# Patient Record
Sex: Female | Born: 1980 | Race: White | Hispanic: No | Marital: Single | Smoking: Current every day smoker
Health system: Southern US, Community
[De-identification: ages and names within clinical notes are randomized; demographics above are authoritative.]

## PROBLEM LIST (undated history)

## (undated) DIAGNOSIS — N390 Urinary tract infection, site not specified: Secondary | ICD-10-CM

## (undated) DIAGNOSIS — R519 Headache, unspecified: Secondary | ICD-10-CM

## (undated) DIAGNOSIS — R569 Unspecified convulsions: Secondary | ICD-10-CM

## (undated) DIAGNOSIS — F32A Depression, unspecified: Secondary | ICD-10-CM

## (undated) DIAGNOSIS — M4800 Spinal stenosis, site unspecified: Secondary | ICD-10-CM

## (undated) DIAGNOSIS — F329 Major depressive disorder, single episode, unspecified: Secondary | ICD-10-CM

## (undated) DIAGNOSIS — M6289 Other specified disorders of muscle: Secondary | ICD-10-CM

## (undated) DIAGNOSIS — Z87442 Personal history of urinary calculi: Secondary | ICD-10-CM

## (undated) DIAGNOSIS — F1910 Other psychoactive substance abuse, uncomplicated: Secondary | ICD-10-CM

## (undated) DIAGNOSIS — F419 Anxiety disorder, unspecified: Secondary | ICD-10-CM

## (undated) DIAGNOSIS — Z8739 Personal history of other diseases of the musculoskeletal system and connective tissue: Secondary | ICD-10-CM

## (undated) DIAGNOSIS — M1990 Unspecified osteoarthritis, unspecified site: Secondary | ICD-10-CM

## (undated) DIAGNOSIS — R011 Cardiac murmur, unspecified: Secondary | ICD-10-CM

## (undated) DIAGNOSIS — R51 Headache: Secondary | ICD-10-CM

## (undated) DIAGNOSIS — Z9289 Personal history of other medical treatment: Secondary | ICD-10-CM

## (undated) HISTORY — PX: LAPAROSCOPY: SHX197

## (undated) HISTORY — PX: TONSILLECTOMY: SUR1361

## (undated) HISTORY — PX: ADENOIDECTOMY: SUR15

## (undated) HISTORY — PX: COLONOSCOPY: SHX174

## (undated) HISTORY — PX: DILATION AND CURETTAGE OF UTERUS: SHX78

## (undated) HISTORY — PX: ABDOMINAL HYSTERECTOMY: SHX81

## (undated) HISTORY — PX: KNEE ARTHROSCOPY: SUR90

## (undated) HISTORY — PX: TYMPANOSTOMY TUBE PLACEMENT: SHX32

---

## 2017-11-18 ENCOUNTER — Emergency Department (HOSPITAL_COMMUNITY): Payer: Medicaid Other

## 2017-11-18 ENCOUNTER — Other Ambulatory Visit: Payer: Self-pay

## 2017-11-18 ENCOUNTER — Inpatient Hospital Stay (HOSPITAL_COMMUNITY)
Admission: EM | Admit: 2017-11-18 | Discharge: 2017-12-02 | DRG: 574 | Disposition: A | Discharge status: Home / Self Care | Payer: Medicaid Other | Attending: Family Medicine | Admitting: Family Medicine

## 2017-11-18 ENCOUNTER — Encounter (HOSPITAL_COMMUNITY): Payer: Self-pay | Admitting: *Deleted

## 2017-11-18 DIAGNOSIS — D649 Anemia, unspecified: Secondary | ICD-10-CM | POA: Diagnosis not present

## 2017-11-18 DIAGNOSIS — Z888 Allergy status to other drugs, medicaments and biological substances status: Secondary | ICD-10-CM | POA: Diagnosis not present

## 2017-11-18 DIAGNOSIS — Z881 Allergy status to other antibiotic agents status: Secondary | ICD-10-CM

## 2017-11-18 DIAGNOSIS — F39 Unspecified mood [affective] disorder: Secondary | ICD-10-CM | POA: Diagnosis not present

## 2017-11-18 DIAGNOSIS — L0211 Cutaneous abscess of neck: Secondary | ICD-10-CM | POA: Diagnosis not present

## 2017-11-18 DIAGNOSIS — F329 Major depressive disorder, single episode, unspecified: Secondary | ICD-10-CM | POA: Diagnosis not present

## 2017-11-18 DIAGNOSIS — L03115 Cellulitis of right lower limb: Principal | ICD-10-CM

## 2017-11-18 DIAGNOSIS — M009 Pyogenic arthritis, unspecified: Secondary | ICD-10-CM | POA: Diagnosis present

## 2017-11-18 DIAGNOSIS — L02415 Cutaneous abscess of right lower limb: Secondary | ICD-10-CM | POA: Diagnosis present

## 2017-11-18 DIAGNOSIS — F1490 Cocaine use, unspecified, uncomplicated: Secondary | ICD-10-CM | POA: Diagnosis present

## 2017-11-18 DIAGNOSIS — F1590 Other stimulant use, unspecified, uncomplicated: Secondary | ICD-10-CM | POA: Diagnosis present

## 2017-11-18 DIAGNOSIS — M65071 Abscess of tendon sheath, right ankle and foot: Secondary | ICD-10-CM | POA: Diagnosis not present

## 2017-11-18 DIAGNOSIS — L02414 Cutaneous abscess of left upper limb: Secondary | ICD-10-CM | POA: Diagnosis present

## 2017-11-18 DIAGNOSIS — Z7151 Drug abuse counseling and surveillance of drug abuser: Secondary | ICD-10-CM | POA: Diagnosis not present

## 2017-11-18 DIAGNOSIS — L03221 Cellulitis of neck: Secondary | ICD-10-CM | POA: Diagnosis present

## 2017-11-18 DIAGNOSIS — L03313 Cellulitis of chest wall: Secondary | ICD-10-CM | POA: Diagnosis not present

## 2017-11-18 DIAGNOSIS — K5900 Constipation, unspecified: Secondary | ICD-10-CM | POA: Diagnosis present

## 2017-11-18 DIAGNOSIS — M62838 Other muscle spasm: Secondary | ICD-10-CM | POA: Diagnosis present

## 2017-11-18 DIAGNOSIS — L02611 Cutaneous abscess of right foot: Secondary | ICD-10-CM

## 2017-11-18 DIAGNOSIS — R739 Hyperglycemia, unspecified: Secondary | ICD-10-CM | POA: Diagnosis not present

## 2017-11-18 DIAGNOSIS — R011 Cardiac murmur, unspecified: Secondary | ICD-10-CM | POA: Diagnosis not present

## 2017-11-18 DIAGNOSIS — L03031 Cellulitis of right toe: Secondary | ICD-10-CM | POA: Diagnosis not present

## 2017-11-18 DIAGNOSIS — B9561 Methicillin susceptible Staphylococcus aureus infection as the cause of diseases classified elsewhere: Secondary | ICD-10-CM | POA: Diagnosis present

## 2017-11-18 DIAGNOSIS — F1290 Cannabis use, unspecified, uncomplicated: Secondary | ICD-10-CM | POA: Diagnosis present

## 2017-11-18 DIAGNOSIS — F419 Anxiety disorder, unspecified: Secondary | ICD-10-CM

## 2017-11-18 DIAGNOSIS — F1190 Opioid use, unspecified, uncomplicated: Secondary | ICD-10-CM | POA: Diagnosis present

## 2017-11-18 DIAGNOSIS — F32A Depression, unspecified: Secondary | ICD-10-CM | POA: Diagnosis present

## 2017-11-18 DIAGNOSIS — L0390 Cellulitis, unspecified: Secondary | ICD-10-CM | POA: Diagnosis not present

## 2017-11-18 DIAGNOSIS — F1410 Cocaine abuse, uncomplicated: Secondary | ICD-10-CM | POA: Diagnosis not present

## 2017-11-18 DIAGNOSIS — F1990 Other psychoactive substance use, unspecified, uncomplicated: Secondary | ICD-10-CM

## 2017-11-18 DIAGNOSIS — Z23 Encounter for immunization: Secondary | ICD-10-CM | POA: Diagnosis not present

## 2017-11-18 DIAGNOSIS — F17210 Nicotine dependence, cigarettes, uncomplicated: Secondary | ICD-10-CM | POA: Diagnosis present

## 2017-11-18 DIAGNOSIS — D72829 Elevated white blood cell count, unspecified: Secondary | ICD-10-CM | POA: Diagnosis not present

## 2017-11-18 DIAGNOSIS — R609 Edema, unspecified: Secondary | ICD-10-CM

## 2017-11-18 DIAGNOSIS — Z91041 Radiographic dye allergy status: Secondary | ICD-10-CM | POA: Diagnosis not present

## 2017-11-18 DIAGNOSIS — E876 Hypokalemia: Secondary | ICD-10-CM | POA: Diagnosis not present

## 2017-11-18 DIAGNOSIS — L03116 Cellulitis of left lower limb: Secondary | ICD-10-CM | POA: Diagnosis present

## 2017-11-18 DIAGNOSIS — D72828 Other elevated white blood cell count: Secondary | ICD-10-CM | POA: Diagnosis not present

## 2017-11-18 DIAGNOSIS — F1910 Other psychoactive substance abuse, uncomplicated: Secondary | ICD-10-CM | POA: Diagnosis not present

## 2017-11-18 DIAGNOSIS — Z79899 Other long term (current) drug therapy: Secondary | ICD-10-CM

## 2017-11-18 DIAGNOSIS — Z978 Presence of other specified devices: Secondary | ICD-10-CM | POA: Diagnosis not present

## 2017-11-18 DIAGNOSIS — B955 Unspecified streptococcus as the cause of diseases classified elsewhere: Secondary | ICD-10-CM | POA: Diagnosis not present

## 2017-11-18 DIAGNOSIS — L02213 Cutaneous abscess of chest wall: Secondary | ICD-10-CM | POA: Diagnosis present

## 2017-11-18 DIAGNOSIS — R7881 Bacteremia: Secondary | ICD-10-CM | POA: Diagnosis not present

## 2017-11-18 DIAGNOSIS — F418 Other specified anxiety disorders: Secondary | ICD-10-CM | POA: Diagnosis present

## 2017-11-18 HISTORY — DX: Personal history of other diseases of the musculoskeletal system and connective tissue: Z87.39

## 2017-11-18 HISTORY — DX: Other psychoactive substance abuse, uncomplicated: F19.10

## 2017-11-18 LAB — COMPREHENSIVE METABOLIC PANEL
ALBUMIN: 3 g/dL — AB (ref 3.5–5.0)
ALK PHOS: 84 U/L (ref 38–126)
ALT: 18 U/L (ref 14–54)
AST: 22 U/L (ref 15–41)
Anion gap: 15 (ref 5–15)
BUN: 20 mg/dL (ref 6–20)
CALCIUM: 8.6 mg/dL — AB (ref 8.9–10.3)
CO2: 23 mmol/L (ref 22–32)
CREATININE: 0.73 mg/dL (ref 0.44–1.00)
Chloride: 100 mmol/L — ABNORMAL LOW (ref 101–111)
GFR calc Af Amer: >60 mL/min (ref >60)
GFR calc non Af Amer: >60 mL/min (ref >60)
GLUCOSE: 131 mg/dL — AB (ref 65–99)
Potassium: 3.5 mmol/L (ref 3.5–5.1)
SODIUM: 138 mmol/L (ref 135–145)
Total Bilirubin: 0.6 mg/dL (ref 0.3–1.2)
Total Protein: 7.4 g/dL (ref 6.5–8.1)

## 2017-11-18 LAB — CBC WITH DIFFERENTIAL/PLATELET
Basophils Absolute: 0 10*3/uL (ref 0.0–0.1)
Basophils Relative: 0 %
EOS PCT: 0 %
Eosinophils Absolute: 0 10*3/uL (ref 0.0–0.7)
HEMATOCRIT: 44.2 % (ref 36.0–46.0)
HEMOGLOBIN: 14.7 g/dL (ref 12.0–15.0)
Lymphocytes Relative: 6 %
Lymphs Abs: 1.4 10*3/uL (ref 0.7–4.0)
MCH: 29 pg (ref 26.0–34.0)
MCHC: 33.3 g/dL (ref 30.0–36.0)
MCV: 87.2 fL (ref 78.0–100.0)
MONO ABS: 0.9 10*3/uL (ref 0.1–1.0)
MONOS PCT: 4 %
NEUTROS PCT: 90 %
Neutro Abs: 20.5 10*3/uL — ABNORMAL HIGH (ref 1.7–7.7)
Platelets: 214 10*3/uL (ref 150–400)
RBC: 5.07 MIL/uL (ref 3.87–5.11)
RDW: 13.6 % (ref 11.5–15.5)
WBC: 22.8 10*3/uL — ABNORMAL HIGH (ref 4.0–10.5)

## 2017-11-18 LAB — I-STAT BETA HCG BLOOD, ED (MC, WL, AP ONLY)

## 2017-11-18 LAB — CBC
HCT: 43.5 % (ref 36.0–46.0)
HEMOGLOBIN: 14.5 g/dL (ref 12.0–15.0)
MCH: 29.5 pg (ref 26.0–34.0)
MCHC: 33.3 g/dL (ref 30.0–36.0)
MCV: 88.4 fL (ref 78.0–100.0)
Platelets: 206 10*3/uL (ref 150–400)
RBC: 4.92 MIL/uL (ref 3.87–5.11)
RDW: 13.6 % (ref 11.5–15.5)
WBC: 22.5 10*3/uL — ABNORMAL HIGH (ref 4.0–10.5)

## 2017-11-18 LAB — LIPASE, BLOOD: Lipase: 20 U/L (ref 11–51)

## 2017-11-18 LAB — I-STAT CG4 LACTIC ACID, ED: Lactic Acid, Venous: 1.48 mmol/L (ref 0.5–1.9)

## 2017-11-18 MED ORDER — ONDANSETRON 4 MG PO TBDP
4.0000 mg | ORAL_TABLET | Freq: Once | ORAL | Status: AC | PRN
  Administered 2017-11-18: 4 mg via ORAL
  Filled 2017-11-18: qty 1

## 2017-11-18 MED ORDER — TRAZODONE HCL 50 MG PO TABS
150.0000 mg | ORAL_TABLET | Freq: Every day | ORAL | Status: DC
  Administered 2017-11-18 – 2017-12-01 (×14): 150 mg via ORAL
  Filled 2017-11-18 (×14): qty 1

## 2017-11-18 MED ORDER — ONDANSETRON HCL 4 MG/2ML IJ SOLN
4.0000 mg | Freq: Four times a day (QID) | INTRAMUSCULAR | Status: DC | PRN
  Administered 2017-11-18 – 2017-11-22 (×2): 4 mg via INTRAVENOUS
  Filled 2017-11-18: qty 2

## 2017-11-18 MED ORDER — NICOTINE 21 MG/24HR TD PT24
21.0000 mg | MEDICATED_PATCH | Freq: Every day | TRANSDERMAL | Status: DC
  Administered 2017-11-18 – 2017-12-02 (×13): 21 mg via TRANSDERMAL
  Filled 2017-11-18 (×12): qty 1

## 2017-11-18 MED ORDER — ORAL CARE MOUTH RINSE
15.0000 mL | Freq: Two times a day (BID) | OROMUCOSAL | Status: DC
  Administered 2017-11-18 – 2017-12-01 (×11): 15 mL via OROMUCOSAL

## 2017-11-18 MED ORDER — ALBUTEROL SULFATE (2.5 MG/3ML) 0.083% IN NEBU: 2.5000 mg | INHALATION_SOLUTION | Freq: Four times a day (QID) | RESPIRATORY_TRACT | Status: DC | PRN

## 2017-11-18 MED ORDER — OXYCODONE-ACETAMINOPHEN 5-325 MG PO TABS
1.0000 | ORAL_TABLET | ORAL | Status: DC | PRN
  Administered 2017-11-18 – 2017-11-20 (×6): 1 via ORAL
  Filled 2017-11-18 (×6): qty 1

## 2017-11-18 MED ORDER — SODIUM CHLORIDE 0.9 % IV BOLUS
1000.0000 mL | Freq: Once | INTRAVENOUS | Status: AC
  Administered 2017-11-18: 1000 mL via INTRAVENOUS

## 2017-11-18 MED ORDER — ACETAMINOPHEN 325 MG PO TABS
650.0000 mg | ORAL_TABLET | Freq: Four times a day (QID) | ORAL | Status: DC | PRN
  Administered 2017-11-20 – 2017-11-22 (×3): 650 mg via ORAL
  Filled 2017-11-18 (×3): qty 2

## 2017-11-18 MED ORDER — SODIUM CHLORIDE 0.9 % IV SOLN
INTRAVENOUS | Status: DC
  Administered 2017-11-18 – 2017-11-22 (×5): via INTRAVENOUS

## 2017-11-18 MED ORDER — TETANUS-DIPHTH-ACELL PERTUSSIS 5-2.5-18.5 LF-MCG/0.5 IM SUSP
0.5000 mL | Freq: Once | INTRAMUSCULAR | Status: AC
  Administered 2017-11-18: 0.5 mL via INTRAMUSCULAR
  Filled 2017-11-18: qty 0.5

## 2017-11-18 MED ORDER — VANCOMYCIN HCL IN DEXTROSE 1-5 GM/200ML-% IV SOLN
1000.0000 mg | Freq: Once | INTRAVENOUS | Status: AC
  Administered 2017-11-18: 1000 mg via INTRAVENOUS
  Filled 2017-11-18: qty 200

## 2017-11-18 MED ORDER — QUETIAPINE FUMARATE 25 MG PO TABS
25.0000 mg | ORAL_TABLET | Freq: Every day | ORAL | Status: DC
  Administered 2017-11-18 – 2017-11-24 (×7): 50 mg via ORAL
  Administered 2017-11-25 – 2017-11-27 (×3): 25 mg via ORAL
  Administered 2017-11-28 – 2017-11-29 (×2): 50 mg via ORAL
  Administered 2017-11-30: 25 mg via ORAL
  Administered 2017-12-01: 50 mg via ORAL
  Filled 2017-11-18 (×4): qty 2
  Filled 2017-11-18: qty 1
  Filled 2017-11-18 (×10): qty 2

## 2017-11-18 MED ORDER — FLUOXETINE HCL 20 MG PO CAPS
20.0000 mg | ORAL_CAPSULE | Freq: Every day | ORAL | Status: DC
  Administered 2017-11-19 – 2017-12-02 (×11): 20 mg via ORAL
  Filled 2017-11-18 (×11): qty 1

## 2017-11-18 MED ORDER — IBUPROFEN 800 MG PO TABS
800.0000 mg | ORAL_TABLET | Freq: Four times a day (QID) | ORAL | Status: DC | PRN
  Administered 2017-11-19: 800 mg via ORAL
  Filled 2017-11-18: qty 1

## 2017-11-18 MED ORDER — ONDANSETRON HCL 4 MG PO TABS: 4.0000 mg | ORAL_TABLET | Freq: Four times a day (QID) | ORAL | Status: DC | PRN

## 2017-11-18 MED ORDER — ALPRAZOLAM 0.5 MG PO TABS
1.0000 mg | ORAL_TABLET | Freq: Two times a day (BID) | ORAL | Status: DC | PRN
  Administered 2017-11-19 – 2017-12-02 (×11): 1 mg via ORAL
  Filled 2017-11-18 (×13): qty 2

## 2017-11-18 MED ORDER — VANCOMYCIN HCL IN DEXTROSE 750-5 MG/150ML-% IV SOLN
750.0000 mg | Freq: Two times a day (BID) | INTRAVENOUS | Status: DC
  Administered 2017-11-19 – 2017-11-20 (×3): 750 mg via INTRAVENOUS
  Filled 2017-11-18 (×3): qty 150

## 2017-11-18 MED ORDER — ACETAMINOPHEN 650 MG RE SUPP: 650.0000 mg | Freq: Four times a day (QID) | RECTAL | Status: DC | PRN

## 2017-11-18 MED ORDER — GABAPENTIN 300 MG PO CAPS
600.0000 mg | ORAL_CAPSULE | Freq: Four times a day (QID) | ORAL | Status: DC
  Administered 2017-11-18 – 2017-12-02 (×48): 600 mg via ORAL
  Filled 2017-11-18 (×51): qty 2

## 2017-11-18 MED ORDER — ENOXAPARIN SODIUM 40 MG/0.4ML ~~LOC~~ SOLN
40.0000 mg | SUBCUTANEOUS | Status: DC
  Administered 2017-11-18 – 2017-12-01 (×13): 40 mg via SUBCUTANEOUS
  Filled 2017-11-18 (×14): qty 0.4

## 2017-11-18 NOTE — ED Notes (Signed)
Kathlene NovemberMike, RN at bedside attempting to start ultrasound IV.

## 2017-11-18 NOTE — ED Notes (Signed)
Blood draw attempt unsuccessful.  Rn notified.

## 2017-11-18 NOTE — H&P (Signed)
History and Physical    Lenoir Facchini VWU:[id number] DOB: May 28, 1981 DOA: 11/18/2017  Referring MD/NP/PA: Wynetta Emery, PA-C PCP: Arlie Solomons, MD  Patient coming from: Home  Chief Complaint: Multiple wounds  I have personally briefly reviewed patient's old medical records in [facility]   HPI: Stacey Martinez is a 37 y.o. female with medical history significant oF IVDA of cocaine  and mood disorder; who presents with complaints of worsening wounds.  Patient admits to IV drug use of cocaine with last use a few days ago.  She has had a wound of her right inner thigh where she previously injected.  However over the last 2-1/2 weeks she has developed redness and swelling of her right ankle that has progressively worsened to the point which is difficult to ambulate.  Complains of having severe pain.  In the last 2 days she noted right upper chest wall swelling and redness that has appeared.  Associated symptoms include complaints of fever up to 103 F at home, nausea, vomiting, and generalized malaise.  Reports taking over-the-counter medications without relief of symptoms.  Denies having any significant dysuria, abdominal pain, chest pain, loss of consciousness, or diarrhea symptoms.  ED Course: Upon admission into the emergency department patient was noted to have vital signs relatively within normal limits.  Labs revealed WBC 22.8 and lactic acid 1.48.  Patient was given empiric antibiotics of vancomycin, 1 L normal saline IV fluids, Tdap booster, and 4 mg of Zofran.  Blood cultures were also obtained and [facility] called to admit.  Review of Systems  Constitutional: Positive for fever and malaise/fatigue. Negative for weight loss.  HENT: Negative for ear discharge and nosebleeds.   Eyes: Negative for double vision and photophobia.  Respiratory: Negative for cough and shortness of breath.   Cardiovascular: Positive for leg swelling. Negative for chest pain.  Gastrointestinal: Positive for  nausea and vomiting.  Genitourinary: Negative for dysuria, frequency and hematuria.  Musculoskeletal: Positive for joint pain and myalgias. Negative for back pain.  Skin:       Positive for skin color change  Neurological: Negative for seizures and loss of consciousness.  Psychiatric/Behavioral: Positive for substance abuse. Negative for suicidal ideas.    Past Medical History:  Diagnosis Date  . Hx of degenerative disc disease   . Substance abuse (HCC)     History reviewed. No pertinent surgical history.   reports that she has been smoking.  She does not have any smokeless tobacco history on file. She reports that she drank alcohol. She reports that she has current or past drug history.  Allergies  Allergen Reactions  . Doxycycline     blisters  . Phenytoin Sodium Extended Other (See Comments)    Seizure    History reviewed. No pertinent family history.  Prior to Admission medications   Medication Sig Start Date End Date Taking? Authorizing Provider  acetaminophen (TYLENOL) 500 MG tablet Take 1,000 mg by mouth every 4 (four) hours as needed for moderate pain.   Yes [provider]  ALPRAZolam Prudy Feeler) 1 MG tablet Take 1 mg by mouth 2 (two) times daily as needed for anxiety.  09/27/17  Yes [provider]  FLUoxetine (PROZAC) 10 MG capsule Take 20 mg by mouth daily. 10/16/17  Yes [provider]  gabapentin (NEURONTIN) 600 MG tablet Take 600 mg by mouth 4 (four) times daily. 09/20/17  Yes [provider]  ibuprofen (ADVIL,MOTRIN) 200 MG tablet Take 800 mg by mouth every 4 (four) hours  as needed for moderate pain.   Yes [provider]  methocarbamol (ROBAXIN) 500 MG tablet Take 1,500-2,000 mg by mouth 4 (four) times daily. 10/16/17  Yes [provider]  QUEtiapine (SEROQUEL) 25 MG tablet Take 25-50 mg by mouth at bedtime. 10/16/17  Yes [provider]  traZODone (DESYREL) 150 MG tablet Take 150 mg by mouth at bedtime. 09/20/17   Yes [provider]    Physical Exam:  Constitutional: Middle aged female who appears to be an some discomfort Vitals:   11/18/17 1609 11/18/17 1907  BP: 125/67 135/85  Pulse: 83 85  Resp: 18 20  Temp: 98.2 F (36.8 C)   TempSrc: Oral   SpO2: 95% 98%   Eyes: PERRL, lids and conjunctivae normal ENMT: Mucous membranes are dry. Posterior pharynx clear of any exudate or lesions.  Neck: normal, supple, no masses, no thyromegaly Respiratory: clear to auscultation bilaterally, no wheezing, no crackles. Normal respiratory effort. No accessory muscle use.  Cardiovascular: Regular rate and rhythm, no murmurs / rubs / gallops. No extremity edema. 2+ pedal pulses. No carotid bruits.  Abdomen: no tenderness, no masses palpated. No hepatosplenomegaly. Bowel sounds positive.  Musculoskeletal: no clubbing / cyanosis. No joint deformity upper and lower extremities. Good ROM, no contractures. Normal muscle tone.  Skin: Erythema and swelling of the right anterior chest wall, left arm, right ankle, and dorsal aspect of foot.  Patient has multiple injection marks noted of the upper extremities bilaterally and ulceration present of the right anterior thigh without significant erythema noted. Neurologic: CN 2-12 grossly intact. Sensation intact, DTR normal. Strength 5/5 in all 4.  Psychiatric: Poor judgment and insight. Alert and oriented x 3. Normal mood.     Labs on Admission: I have personally reviewed following labs and imaging studies  CBC: Recent Labs  Lab 11/18/17 1749 11/18/17 1922  WBC 22.5* 22.8*  NEUTROABS  --  20.5*  HGB 14.5 14.7  HCT 43.5 44.2  MCV 88.4 87.2  PLT 206 214   Basic Metabolic Panel: Recent Labs  Lab 11/18/17 1749  NA 138  K 3.5  CL 100*  CO2 23  GLUCOSE 131*  BUN 20  CREATININE 0.73  CALCIUM 8.6*   GFR: CrCl cannot be calculated (Unknown ideal weight.). Liver Function Tests: Recent Labs  Lab 11/18/17 1749  AST 22  ALT 18  ALKPHOS 84    BILITOT 0.6  PROT 7.4  ALBUMIN 3.0*   Recent Labs  Lab 11/18/17 1749  LIPASE 20   No results for input(s): AMMONIA in the last 168 hours. Coagulation Profile: No results for input(s): INR, PROTIME in the last 168 hours. Cardiac Enzymes: No results for input(s): CKTOTAL, CKMB, CKMBINDEX, TROPONINI in the last 168 hours. BNP (last 3 results) No results for input(s): PROBNP in the last 8760 hours. HbA1C: No results for input(s): HGBA1C in the last 72 hours. CBG: No results for input(s): GLUCAP in the last 168 hours. Lipid Profile: No results for input(s): CHOL, HDL, LDLCALC, TRIG, CHOLHDL, LDLDIRECT in the last 72 hours. Thyroid Function Tests: No results for input(s): TSH, T4TOTAL, FREET4, T3FREE, THYROIDAB in the last 72 hours. Anemia Panel: No results for input(s): VITAMINB12, FOLATE, FERRITIN, TIBC, IRON, RETICCTPCT in the last 72 hours. Urine analysis: No results found for: COLORURINE, APPEARANCEUR, LABSPEC, PHURINE, GLUCOSEU, HGBUR, BILIRUBINUR, KETONESUR, PROTEINUR, UROBILINOGEN, NITRITE, LEUKOCYTESUR Sepsis Labs: No results found for this or any previous visit (from the past 240 hour(s)).   Radiological Exams on Admission: Dg Chest 2 View  Result Date: 11/18/2017 CLINICAL DATA:  Nausea and chills for 4 days. EXAM: CHEST - 2 VIEW COMPARISON:  None. FINDINGS: The heart size and mediastinal contours are within normal limits. Both lungs are clear. The visualized skeletal structures are unremarkable. IMPRESSION: Negative.  No active cardiopulmonary disease. Electronically Signed   By: Myles Rosenthal M.D.   On: 11/18/2017 19:27    EKG: Independently reviewed.  Normal sinus rhythm at 84 bpm  Assessment/Plan Cellulitis: Acute.  Patient presents with complaints of worsening edema and swelling of the right chest wall and foot.  Pulses appear to be intact. - Admit to a MedSurg bed - Follow-up blood cultures, RPR, HIV, hepatitis panel, sed rate - Continue empiric antibiotics of  vancomycin - WIll need to evaluate for underlying abscess - Wound care consult   Leukocytosis: Acute.  WBC elevated 22.5 on admission.  Lactic acid was noted to be reassuring. - Recheck WBC in a.m.  IVDU: Reports history of IV use of cocaine. - Follow-up urine drug screen, - check Echocardiogram - Social work consult for current substance abuse  Anxiety and depression - Continue Xanax prn, Seroquel, and Prozac  DVT prophylaxis:  lovenox   Code Status: Full Family Communication: No family present at bedside Disposition Plan: To be determined Consults called: none Admission status: inpatient  Clydie Braun MD [facility] Pager [phone]   If 7PM-7AM, please contact night-coverage [email or web address] Password [facility]  11/18/2017, 8:05 PM

## 2017-11-18 NOTE — ED Notes (Signed)
ED TO INPATIENT HANDOFF REPORT  Name/Age/Gender Stacey Martinez 37 y.o. female  Code Status    Code Status Orders  (From admission, onward)        Start     Ordered   11/18/17 2041  Full code  Continuous     11/18/17 2042    Code Status History    This patient has a current code status but no historical code status.      Home/SNF/Other Home  Chief Complaint Cellulitis; Emesis; Diarrhea  Level of Care/Admitting Diagnosis ED Disposition    ED Disposition Condition Comment   Admit  Hospital Area: [facility] [[id number]]  Level of Care: Telemetry [5]  Admit to tele based on following criteria: Complex arrhythmia (Bradycardia/Tachycardia)  Diagnosis: Cellulitis [938101]  Admitting Physician: Norval Morton [[id number]]  Attending Physician: Norval Morton [[id number]]  Estimated length of stay: 3 - 4 days  Certification:: I certify this patient will need inpatient services for at least 2 midnights  PT Class (Do Not Modify): Inpatient [101]  PT Acc Code (Do Not Modify): Private [1]       Medical History Past Medical History:  Diagnosis Date  . Hx of degenerative disc disease   . Substance abuse (HCC)     Allergies Allergies  Allergen Reactions  . Doxycycline     blisters  . Phenytoin Sodium Extended Other (See Comments)    Seizure    IV Location/Drains/Wounds Patient Lines/Drains/Airways Status   Active Line/Drains/Airways    None          Labs/Imaging Results for orders placed or performed during the hospital encounter of 11/18/17 (from the past 48 hour(s))  Lipase, blood     Status: None   Collection Time: 11/18/17  5:49 PM  Result Value Ref Range   Lipase 20 11 - 51 U/L    Comment: Performed at [facility], [place] [address] [place], [place] [address]  Comprehensive metabolic panel     Status: Abnormal   Collection Time: 11/18/17  5:49 PM  Result Value Ref Range   Sodium 138 135 - 145 mmol/L   Potassium 3.5  3.5 - 5.1 mmol/L   Chloride 100 (L) 101 - 111 mmol/L   CO2 23 22 - 32 mmol/L   Glucose, Bld 131 (H) 65 - 99 mg/dL   BUN 20 6 - 20 mg/dL   Creatinine, Ser 0.73 0.44 - 1.00 mg/dL   Calcium 8.6 (L) 8.9 - 10.3 mg/dL   Total Protein 7.4 6.5 - 8.1 g/dL   Albumin 3.0 (L) 3.5 - 5.0 g/dL   AST 22 15 - 41 U/L   ALT 18 14 - 54 U/L   Alkaline Phosphatase 84 38 - 126 U/L   Total Bilirubin 0.6 0.3 - 1.2 mg/dL   GFR calc non Af Amer >60 >60 mL/min   GFR calc Af Amer >60 >60 mL/min    Comment: (NOTE) The eGFR has been calculated using the CKD EPI equation. This calculation has not been validated in all clinical situations. eGFR's persistently <60 mL/min signify possible Chronic Kidney Disease.    Anion gap 15 5 - 15    Comment: Performed at [facility], [place] [address] [place],  [address]  CBC     Status: Abnormal   Collection Time: 11/18/17  5:49 PM  Result Value Ref Range   WBC 22.5 (H) 4.0 - 10.5 K/uL   RBC 4.92 3.87 - 5.11 MIL/uL   Hemoglobin 14.5  12.0 - 15.0 g/dL   HCT 43.5 36.0 - 46.0 %   MCV 88.4 78.0 - 100.0 fL   MCH 29.5 26.0 - 34.0 pg   MCHC 33.3 30.0 - 36.0 g/dL   RDW 13.6 11.5 - 15.5 %   Platelets 206 150 - 400 K/uL    Comment: Performed at [facility], [place] [address], [place], [place] [address]  I-Stat beta hCG blood, ED     Status: None   Collection Time: 11/18/17  5:55 PM  Result Value Ref Range   I-stat hCG, quantitative <5.0 <5 mIU/mL   Comment 3            Comment:   GEST. AGE      CONC.  (mIU/mL)   <=1 WEEK        5 - 50     2 WEEKS       50 - 500     3 WEEKS       100 - 10,000     4 WEEKS     1,000 - 30,000        FEMALE AND NON-PREGNANT FEMALE:     LESS THAN 5 mIU/mL   I-Stat CG4 Lactic Acid, ED     Status: None   Collection Time: 11/18/17  7:04 PM  Result Value Ref Range   Lactic Acid, Venous 1.48 0.5 - 1.9 mmol/L  CBC with Differential     Status: Abnormal   Collection Time: 11/18/17  7:22 PM  Result Value Ref  Range   WBC 22.8 (H) 4.0 - 10.5 K/uL   RBC 5.07 3.87 - 5.11 MIL/uL   Hemoglobin 14.7 12.0 - 15.0 g/dL   HCT 44.2 36.0 - 46.0 %   MCV 87.2 78.0 - 100.0 fL   MCH 29.0 26.0 - 34.0 pg   MCHC 33.3 30.0 - 36.0 g/dL   RDW 13.6 11.5 - 15.5 %   Platelets 214 150 - 400 K/uL   Neutrophils Relative % 90 %   Lymphocytes Relative 6 %   Monocytes Relative 4 %   Eosinophils Relative 0 %   Basophils Relative 0 %   Neutro Abs 20.5 (H) 1.7 - 7.7 K/uL   Lymphs Abs 1.4 0.7 - 4.0 K/uL   Monocytes Absolute 0.9 0.1 - 1.0 K/uL   Eosinophils Absolute 0.0 0.0 - 0.7 K/uL   Basophils Absolute 0.0 0.0 - 0.1 K/uL   WBC Morphology WHITE COUNT CONFIRMED ON SMEAR    Smear Review MORPHOLOGY UNREMARKABLE     Comment: Performed at [facility], [place] [address], [place], [place] [address]   Dg Chest 2 View  Result Date: 11/18/2017 CLINICAL DATA:  Nausea and chills for 4 days. EXAM: CHEST - 2 VIEW COMPARISON:  None. FINDINGS: The heart size and mediastinal contours are within normal limits. Both lungs are clear. The visualized skeletal structures are unremarkable. IMPRESSION: Negative.  No active cardiopulmonary disease. Electronically Signed   By: Earle Gell M.D.   On: 11/18/2017 19:27    Pending Labs Unresulted Labs (From admission, onward)   Start     Ordered   11/19/17 0500  CBC  Tomorrow morning,   R     11/18/17 2042   11/19/17 3817  Basic metabolic panel  Tomorrow morning,   R     11/18/17 2042   11/18/17 1856  Blood culture (routine x 2)  BLOOD CULTURE X 2,   STAT     11/18/17 1855  11/18/17 1856  Blood culture (routine x 2)  BLOOD CULTURE X 2,   STAT     11/18/17 1855   11/18/17 1855  Sedimentation rate  STAT,   STAT     11/18/17 1854   11/18/17 1851  RPR  (STD Panel (GC, Chlamydia, RPR, HIV) (pnl))  Once,   STAT     11/18/17 1850   11/18/17 1851  HIV antibody  (STD Panel (GC, Chlamydia, RPR, HIV) (pnl))  Once,   STAT     11/18/17 1850   11/18/17 1851  Hepatitis panel, acute  STAT,    STAT     11/18/17 1850   11/18/17 1758  Blood culture (routine x 2)  BLOOD CULTURE X 2,   STAT     11/18/17 1757   11/18/17 1758  Rapid urine drug screen (hospital performed)  STAT,   R     11/18/17 1757   11/18/17 1721  Urinalysis, Routine w reflex microscopic  STAT,   STAT     11/18/17 1720      Vitals/Pain Today's Vitals   11/18/17 1605 11/18/17 1609 11/18/17 1907  BP:  125/67 135/85  Pulse:  83 85  Resp:  18 20  Temp:  98.2 F (36.8 C)   TempSrc:  Oral   SpO2:  95% 98%  PainSc: 10-Worst pain ever      Isolation Precautions No active isolations  Medications Medications  QUEtiapine (SEROQUEL) tablet 25-50 mg (has no administration in time range)  traZODone (DESYREL) tablet 150 mg (has no administration in time range)  ALPRAZolam (XANAX) tablet 1 mg (has no administration in time range)  ibuprofen (ADVIL,MOTRIN) tablet 800 mg (has no administration in time range)  enoxaparin (LOVENOX) injection 40 mg (has no administration in time range)  acetaminophen (TYLENOL) tablet 650 mg (has no administration in time range)    Or  acetaminophen (TYLENOL) suppository 650 mg (has no administration in time range)  ondansetron (ZOFRAN) tablet 4 mg (has no administration in time range)    Or  ondansetron (ZOFRAN) injection 4 mg (has no administration in time range)  albuterol (PROVENTIL) (2.5 MG/3ML) 0.083% nebulizer solution 2.5 mg (has no administration in time range)  ondansetron (ZOFRAN-ODT) disintegrating tablet 4 mg (4 mg Oral Given 11/18/17 1753)  Tdap (BOOSTRIX) injection 0.5 mL (0.5 mLs Intramuscular Given 11/18/17 1906)  sodium chloride 0.9 % bolus 1,000 mL (1,000 mLs Intravenous New Bag/Given 11/18/17 1906)  vancomycin (VANCOCIN) IVPB 1000 mg/200 mL premix (0 mg Intravenous Stopped 11/18/17 2021)    Mobility walks

## 2017-11-18 NOTE — ED Notes (Signed)
Attempted IV access x 1, unsuccessful, will consult RN to start ultrasound IV.

## 2017-11-18 NOTE — Progress Notes (Signed)
Pharmacy Antibiotic Note  Stacey BelfastStacy Martinez is a 37 y.o. female with PMH significant for IV cocaine use admitted on 11/18/2017 with cellulitis.  Pharmacy has been consulted for Vancomycin dosing.  Plan: Vancomycin 1g IV x 1 given in the ED. Continue with Vancomycin 750mg  IV q12h. Plan for Vancomycin levels at steady state. Monitor renal function, cultures, clinical course.   Height: 5\' 4"  (162.6 cm) Weight: 152 lb 3.2 oz (69 kg) IBW/kg (Calculated) : 54.7  Temp (24hrs), Avg:98.6 F (37 C), Min:98.2 F (36.8 C), Max:98.9 F (37.2 C)  Recent Labs  Lab 11/18/17 1749 11/18/17 1904 11/18/17 1922  WBC 22.5*  --  22.8*  CREATININE 0.73  --   --   LATICACIDVEN  --  1.48  --     Estimated Creatinine Clearance: 90.9 mL/min (by C-G formula based on SCr of 0.73 mg/dL).    Allergies  Allergen Reactions  . Doxycycline     blisters  . Phenytoin Sodium Extended Other (See Comments)    Seizure    Antimicrobials this admission: 4/8 >> Vancomycin >>  Dose adjustments this admission: --  Microbiology results: 4/8 BCx: sent   Thank you for allowing pharmacy to be a part of this patient's care.  Stacey MeadGadhia, Stacey Martinez 11/18/2017 10:08 PM

## 2017-11-18 NOTE — ED Provider Notes (Signed)
[facility]-EMERGENCY DEPT Provider Note   CSN: [id number] Arrival date & time: 11/18/17  1544     History   Chief Complaint Chief Complaint  Patient presents with  . Cellulitis  . Emesis    HPI   Blood pressure 125/67, pulse 83, temperature 98.2 F (36.8 C), temperature source Oral, resp. rate 18, SpO2 95 %.  Stacey Martinez is a 37 y.o. female past medical history significant for IV cocaine use complaining of significant pain to right lower foot worsening over the course of last several days she endorses significant myalgia with tactile fever several days ago.  She states that she has been injected in a few days.  She denies any nausea or vomiting.  She was unaware of other lesions on her skin aside from the foot.  Past Medical History:  Diagnosis Date  . Hx of degenerative disc disease   . Substance abuse [facility])     Patient Active Problem List   Diagnosis Date Noted  . Cellulitis 11/18/2017     OB History   None      Home Medications    Prior to Admission medications   Medication Sig Start Date End Date Taking? Authorizing Provider  acetaminophen (TYLENOL) 500 MG tablet Take 1,000 mg by mouth every 4 (four) hours as needed for moderate pain.   Yes [provider]  ALPRAZolam Prudy Feeler) 1 MG tablet Take 1 mg by mouth 2 (two) times daily as needed for anxiety.  09/27/17  Yes [provider]  FLUoxetine (PROZAC) 10 MG capsule Take 20 mg by mouth daily. 10/16/17  Yes [provider]  gabapentin (NEURONTIN) 600 MG tablet Take 600 mg by mouth 4 (four) times daily. 09/20/17  Yes [provider]  ibuprofen (ADVIL,MOTRIN) 200 MG tablet Take 800 mg by mouth every 4 (four) hours as needed for moderate pain.   Yes [provider]  methocarbamol (ROBAXIN) 500 MG tablet Take 1,500-2,000 mg by mouth 4 (four) times daily. 10/16/17  Yes [provider]  QUEtiapine (SEROQUEL) 25 MG tablet Take 25-50 mg by mouth at  bedtime. 10/16/17  Yes [provider]  traZODone (DESYREL) 150 MG tablet Take 150 mg by mouth at bedtime. 09/20/17  Yes [provider]    Family History No family history on file.  Social History Social History   Tobacco Use  . Smoking status: Current Every Day Smoker  Substance Use Topics  . Alcohol use: Not Currently  . Drug use: Yes     Allergies   Doxycycline and Phenytoin sodium extended   Review of Systems Review of Systems  A complete review of systems was obtained and all systems are negative except as noted in the HPI and PMH.   Physical Exam Updated Vital Signs BP 135/85   Pulse 85   Temp 98.2 F (36.8 C) (Oral)   Resp 20   SpO2 98%   Physical Exam  Constitutional: She is oriented to person, place, and time. She appears well-developed and well-nourished. No distress.  HENT:  Head: Normocephalic and atraumatic.  Mouth/Throat: Oropharynx is clear and moist.  Eyes: Pupils are equal, round, and reactive to light. Conjunctivae and EOM are normal.  Neck: Normal range of motion.  Cardiovascular: Normal rate, regular rhythm and intact distal pulses.  No murmur heard. Pulmonary/Chest: Effort normal and breath sounds normal.  Abdominal: Soft. There is no tenderness.  Musculoskeletal: Normal range of motion.  Neurological: She is alert and oriented to person, place, and  time.  Skin: She is not diaphoretic. There is erythema.  No Janeway lesions, Osler Nodes or splint hemmorages   Patient with large cellulitis to dorsum of right foot and ankle.  It is not circumferential.  There is no focal fluctuance.   Patient with a large right neck cellulitis with significant induration.  Left lower arm cellulitis  Psychiatric: She has a normal mood and affect.  Nursing note and vitals reviewed.              ED Treatments / Results  Labs (all labs ordered are listed, but only abnormal results are displayed) Labs Reviewed  COMPREHENSIVE  METABOLIC PANEL - Abnormal; Notable for the following components:      Result Value   Chloride 100 (*)    Glucose, Bld 131 (*)    Calcium 8.6 (*)    Albumin 3.0 (*)    All other components within normal limits  CBC - Abnormal; Notable for the following components:   WBC 22.5 (*)    All other components within normal limits  CBC WITH DIFFERENTIAL/PLATELET - Abnormal; Notable for the following components:   WBC 22.8 (*)    Neutro Abs 20.5 (*)    All other components within normal limits  CULTURE, BLOOD (ROUTINE X 2)  CULTURE, BLOOD (ROUTINE X 2)  CULTURE, BLOOD (ROUTINE X 2)  CULTURE, BLOOD (ROUTINE X 2)  CULTURE, BLOOD (ROUTINE X 2)  CULTURE, BLOOD (ROUTINE X 2)  LIPASE, BLOOD  URINALYSIS, ROUTINE W REFLEX MICROSCOPIC  RAPID URINE DRUG SCREEN, HOSP PERFORMED  RPR  HIV ANTIBODY (ROUTINE TESTING)  HEPATITIS PANEL, ACUTE  SEDIMENTATION RATE  CBC  BASIC METABOLIC PANEL  I-STAT BETA HCG BLOOD, ED ([facility], [facility], [facility] ONLY)  I-STAT CG4 LACTIC ACID, ED  I-STAT CG4 LACTIC ACID, ED  GC/CHLAMYDIA PROBE AMP ([place]) NOT AT [facility]    EKG None  Radiology Dg Chest 2 View  Result Date: 11/18/2017 CLINICAL DATA:  Nausea and chills for 4 days. EXAM: CHEST - 2 VIEW COMPARISON:  None. FINDINGS: The heart size and mediastinal contours are within normal limits. Both lungs are clear. The visualized skeletal structures are unremarkable. IMPRESSION: Negative.  No active cardiopulmonary disease. Electronically Signed   By: Myles Rosenthal M.D.   On: 11/18/2017 19:27    Procedures Procedures (including critical care time)  Medications Ordered in ED Medications  QUEtiapine (SEROQUEL) tablet 25-50 mg (has no administration in time range)  traZODone (DESYREL) tablet 150 mg (has no administration in time range)  ALPRAZolam (XANAX) tablet 1 mg (has no administration in time range)  ibuprofen (ADVIL,MOTRIN) tablet 800 mg (has no administration in time range)  enoxaparin (LOVENOX) injection 40 mg (has no  administration in time range)  acetaminophen (TYLENOL) tablet 650 mg (has no administration in time range)    Or  acetaminophen (TYLENOL) suppository 650 mg (has no administration in time range)  ondansetron (ZOFRAN) tablet 4 mg (has no administration in time range)    Or  ondansetron (ZOFRAN) injection 4 mg (has no administration in time range)  albuterol (PROVENTIL) (2.5 MG/3ML) 0.083% nebulizer solution 2.5 mg (has no administration in time range)  ondansetron (ZOFRAN-ODT) disintegrating tablet 4 mg (4 mg Oral Given 11/18/17 1753)  Tdap (BOOSTRIX) injection 0.5 mL (0.5 mLs Intramuscular Given 11/18/17 1906)  sodium chloride 0.9 % bolus 1,000 mL (1,000 mLs Intravenous New Bag/Given 11/18/17 1906)  vancomycin (VANCOCIN) IVPB 1000 mg/200 mL premix (0 mg Intravenous Stopped 11/18/17 2021)     Initial Impression /  Assessment and Plan / ED Course  I have reviewed the triage vital signs and the nursing notes.  Pertinent labs & imaging results that were available during my care of the patient were reviewed by me and considered in my medical decision making (see chart for details).     Vitals:   11/18/17 1609 11/18/17 1907  BP: 125/67 135/85  Pulse: 83 85  Resp: 18 20  Temp: 98.2 F (36.8 C)   TempSrc: Oral   SpO2: 95% 98%    Medications  QUEtiapine (SEROQUEL) tablet 25-50 mg (has no administration in time range)  traZODone (DESYREL) tablet 150 mg (has no administration in time range)  ALPRAZolam (XANAX) tablet 1 mg (has no administration in time range)  ibuprofen (ADVIL,MOTRIN) tablet 800 mg (has no administration in time range)  enoxaparin (LOVENOX) injection 40 mg (has no administration in time range)  acetaminophen (TYLENOL) tablet 650 mg (has no administration in time range)    Or  acetaminophen (TYLENOL) suppository 650 mg (has no administration in time range)  ondansetron (ZOFRAN) tablet 4 mg (has no administration in time range)    Or  ondansetron (ZOFRAN) injection 4 mg (has no  administration in time range)  albuterol (PROVENTIL) (2.5 MG/3ML) 0.083% nebulizer solution 2.5 mg (has no administration in time range)  ondansetron (ZOFRAN-ODT) disintegrating tablet 4 mg (4 mg Oral Given 11/18/17 1753)  Tdap (BOOSTRIX) injection 0.5 mL (0.5 mLs Intramuscular Given 11/18/17 1906)  sodium chloride 0.9 % bolus 1,000 mL (1,000 mLs Intravenous New Bag/Given 11/18/17 1906)  vancomycin (VANCOCIN) IVPB 1000 mg/200 mL premix (0 mg Intravenous Stopped 11/18/17 2021)    Jerrell BelfastStacy Dante is 37 y.o. female presenting with multiple areas of cellulitis, she reports a fever at home.  She is IV drug user, no other stigmata of endocarditis.  Multiple cultures are ordered, we have had difficulty in obtaining functional IV access with this patient.  He is a white count of 22, cultures pending.  Discussed with Dr. Katrinka BlazingSmith who accepts admission.   Final Clinical Impressions(s) / ED Diagnoses   Final diagnoses:  Cellulitis, unspecified cellulitis site  IVDU (intravenous drug user)    ED Discharge Orders    None       Natahlia Hoggard, Mardella Laymanicole, PA-C 11/18/17 2102    Benjiman CorePickering, Nathan, MD 11/18/17 2326

## 2017-11-18 NOTE — ED Notes (Signed)
Bed: WLPT3 Expected date:  Expected time:  Means of arrival:  Comments: 

## 2017-11-18 NOTE — ED Triage Notes (Signed)
Per EMS, pt called out for n/v/d chills since last Thursday. Pt has cellulitis in right leg, pt states she injected cocaine into her leg. Pt also has cellulitis to right neck. Pt has ulcerated area to right groin. Pt denies drug use over the past week.   BP 123/73 HR 82 RR 22 O2 97% on RA CBG 132

## 2017-11-19 ENCOUNTER — Inpatient Hospital Stay (HOSPITAL_COMMUNITY): Payer: Medicaid Other

## 2017-11-19 ENCOUNTER — Encounter (HOSPITAL_COMMUNITY): Payer: Self-pay | Admitting: Internal Medicine

## 2017-11-19 DIAGNOSIS — F32A Depression, unspecified: Secondary | ICD-10-CM | POA: Diagnosis present

## 2017-11-19 DIAGNOSIS — F1990 Other psychoactive substance use, unspecified, uncomplicated: Secondary | ICD-10-CM | POA: Diagnosis present

## 2017-11-19 DIAGNOSIS — D72829 Elevated white blood cell count, unspecified: Secondary | ICD-10-CM | POA: Diagnosis present

## 2017-11-19 DIAGNOSIS — L03313 Cellulitis of chest wall: Secondary | ICD-10-CM

## 2017-11-19 DIAGNOSIS — R011 Cardiac murmur, unspecified: Secondary | ICD-10-CM

## 2017-11-19 DIAGNOSIS — F419 Anxiety disorder, unspecified: Secondary | ICD-10-CM

## 2017-11-19 DIAGNOSIS — L03221 Cellulitis of neck: Secondary | ICD-10-CM

## 2017-11-19 DIAGNOSIS — F329 Major depressive disorder, single episode, unspecified: Secondary | ICD-10-CM | POA: Diagnosis present

## 2017-11-19 DIAGNOSIS — D72828 Other elevated white blood cell count: Secondary | ICD-10-CM

## 2017-11-19 LAB — URINALYSIS, ROUTINE W REFLEX MICROSCOPIC
Glucose, UA: NEGATIVE mg/dL
Hgb urine dipstick: NEGATIVE
Ketones, ur: 5 mg/dL — AB
Leukocytes, UA: NEGATIVE
Nitrite: NEGATIVE
PROTEIN: 100 mg/dL — AB
Specific Gravity, Urine: 1.032 — ABNORMAL HIGH (ref 1.005–1.030)
pH: 6 (ref 5.0–8.0)

## 2017-11-19 LAB — BASIC METABOLIC PANEL
Anion gap: 11 (ref 5–15)
BUN: 21 mg/dL — AB (ref 6–20)
CHLORIDE: 98 mmol/L — AB (ref 101–111)
CO2: 25 mmol/L (ref 22–32)
CREATININE: 0.66 mg/dL (ref 0.44–1.00)
Calcium: 8.4 mg/dL — ABNORMAL LOW (ref 8.9–10.3)
GFR calc non Af Amer: >60 mL/min (ref >60)
Glucose, Bld: 120 mg/dL — ABNORMAL HIGH (ref 65–99)
Potassium: 3.7 mmol/L (ref 3.5–5.1)
Sodium: 134 mmol/L — ABNORMAL LOW (ref 135–145)

## 2017-11-19 LAB — SEDIMENTATION RATE: Sed Rate: 82 mm/hr — ABNORMAL HIGH (ref 0–22)

## 2017-11-19 LAB — CBC
HEMATOCRIT: 40.6 % (ref 36.0–46.0)
HEMOGLOBIN: 13.3 g/dL (ref 12.0–15.0)
MCH: 29.1 pg (ref 26.0–34.0)
MCHC: 32.8 g/dL (ref 30.0–36.0)
MCV: 88.8 fL (ref 78.0–100.0)
Platelets: 243 10*3/uL (ref 150–400)
RBC: 4.57 MIL/uL (ref 3.87–5.11)
RDW: 14 % (ref 11.5–15.5)
WBC: 26.1 10*3/uL — ABNORMAL HIGH (ref 4.0–10.5)

## 2017-11-19 LAB — RAPID URINE DRUG SCREEN, HOSP PERFORMED
AMPHETAMINES: POSITIVE — AB
BENZODIAZEPINES: POSITIVE — AB
Barbiturates: NOT DETECTED
COCAINE: POSITIVE — AB
OPIATES: POSITIVE — AB
TETRAHYDROCANNABINOL: POSITIVE — AB

## 2017-11-19 LAB — ECHOCARDIOGRAM COMPLETE
Height: 64 in
Weight: 2435.2 oz

## 2017-11-19 MED ORDER — METHOCARBAMOL 750 MG PO TABS
1500.0000 mg | ORAL_TABLET | Freq: Four times a day (QID) | ORAL | Status: DC | PRN
  Administered 2017-11-19 – 2017-12-02 (×20): 1500 mg via ORAL
  Filled 2017-11-19: qty 2
  Filled 2017-11-19: qty 3
  Filled 2017-11-19 (×4): qty 2
  Filled 2017-11-19: qty 3
  Filled 2017-11-19 (×12): qty 2

## 2017-11-19 MED ORDER — MUPIROCIN 2 % EX OINT
TOPICAL_OINTMENT | Freq: Every day | CUTANEOUS | Status: DC
  Administered 2017-11-20 – 2017-12-02 (×11): via TOPICAL
  Filled 2017-11-19 (×4): qty 22
  Filled 2017-11-19: qty 88
  Filled 2017-11-19: qty 22

## 2017-11-19 NOTE — Consult Note (Signed)
WOC Nurse wound consult note Reason for Consult:IVDA with cellulitis to right foot, right IJ location and left inner arm.   Wound type:infectious  Pressure Injury POA: NA Measurement:8 cm x 8 cm intact erythema and induration to right dorsal foot 6 cm x 4 cm erythema and induration to right IJ location 0.5 cm lesion to left inner arm with 0.3 cm depth.  Scabbed periwound.  Multiple superficial scabbed lesions to arms.  Wound ZOX:WRUEbed:foot and neck wounds intact,  Scabbed lesion to arm Drainage (amount, consistency, odor) scant serosanguinous to arm lesions Periwound:edema and erythema Dressing procedure/placement/frequency:Cleanse wound to left inner arm with NS and pat dry. Apply mupirocin ointment to wound bed.  Cover with dry dressing and tape.  Change daily. Will not follow at this time.  Please re-consult if needed.  Maple HudsonKaren Reyann Troop RN BSN CWON Pager [phone]

## 2017-11-19 NOTE — Progress Notes (Signed)
CSW acknowledged consult for current substance abuse.   CSW attempted to speak with patient at bedside regarding consult for substance abuse, patient reported that she was in pain and requested that CSW come back at a later time. CSW agreed to try again at a later time.  CSW will attempt to speak with patient at a later time.  Celso SickleKimberly Aleksia Freiman, Connecticut[place]WA Clinical Social Worker [facility] Cell#: [phone]

## 2017-11-19 NOTE — Progress Notes (Signed)
[facility] Progress Note  Subjective: no c/o today, awakens easily  Vitals:   11/18/17 2135 11/18/17 2152 11/19/17 0442 11/19/17 1510  BP: 129/74 135/78 124/70 115/65  Pulse: 90 84 80 71  Resp: (!) 28 (!) 22 (!) 24 20  Temp:  98.9 F (37.2 C) 98.8 F (37.1 C) 97.8 F (36.6 C)  TempSrc:  Oral Axillary Oral  SpO2: 99% 98% 95% 93%  Weight:  69 kg (152 lb 3.2 oz)    Height:  5\' 4"  (1.626 m)      Inpatient medications: . enoxaparin (LOVENOX) injection  40 mg Subcutaneous Q24H  . FLUoxetine  20 mg Oral Daily  . gabapentin  600 mg Oral QID  . mouth rinse  15 mL Mouth Rinse BID  . mupirocin ointment   Topical Daily  . nicotine  21 mg Transdermal Daily  . QUEtiapine  25-50 mg Oral QHS  . traZODone  150 mg Oral QHS   . sodium chloride 75 mL/hr at 11/19/17 1329  . vancomycin 750 mg (11/19/17 1524)   acetaminophen **OR** acetaminophen, albuterol, ALPRAZolam, ibuprofen, methocarbamol, ondansetron **OR** ondansetron (ZOFRAN) IV, oxyCODONE-acetaminophen  Exam: Eyes: PERRL, lids and conjunctivae normal ENMT: Mucous membranes are dry. Posterior pharynx clear of any exudate or lesions.  Neck: normal, supple, no masses, no thyromegaly Respiratory: clear to auscultation bilaterally, no wheezing, no crackles Cardiovascular: Regular rate and rhythm, no murmurs / rubs / gallops. No extremity edema. 2+ pedal pulses. No carotid bruits.  Abdomen: no tenderness, no masses palpated. No hepatosplenomegaly. Bowel sounds positive.  Musculoskeletal: no clubbing / cyanosis. No joint deformity upper and lower extremities. Good ROM, no contractures. Normal muscle tone.  Skin: - erythema and swelling of the right anterior chest wall over the R medial clavicle and also induration/ erythema/ tenderness creeping up into the R anterior neck - erythema, tenderness and swelling of the R ankle/ foot - erythema, tenderness and swelling of the left forearm, this does not have as much of an acute appearance  as the R foot and R chest/ neck areas  - multiple injection marks noted of the upper extremities bilaterally and ulceration present of the right anterior thigh without significant erythema noted Neurologic: CN 2-12 grossly intact. Sensation intact, DTR normal. Strength 5/5 in all 4.  Psychiatric: Poor judgment and insight. Alert and oriented x 3. Normal mood.      Brief Summary: 37 y.o. female with medical history significant oF IVDA of cocaine  and mood disorder; who presented with complaints of worsening wounds.  Patient admitted to IV drug use of cocaine with last use a few days PTA.  She has had a wound of her right inner thigh where she previously injected.  However over the last 2-1/2 weeks she had developed redness and swelling of her right ankle that had progressively worsened to the point where it was difficult to ambulate.  Complained of having severe pain. For 2 days she noted right upper chest wall swelling and redness that has appeared.  Associated symptoms include complaints of fever up to 103 F at home, nausea, vomiting, and generalized malaise.  Reported taking over-the-counter medications without relief of symptoms.  Denied having any significant dysuria, abdominal pain, chest pain, loss of consciousness, or diarrhea symptoms.  ED Course: Upon admission into the emergency department patient was noted to have vital signs relatively within normal limits.  Labs revealed WBC 22.8 and lactic acid 1.48.  Patient was given empiric antibiotics of vancomycin, 1 L normal saline IV fluids,  Tdap booster, and 4 mg of Zofran.  Blood cultures were also obtained and [facility] called to admit.      Assessment/ Plan:  1) Cellulitis: acute, patient presents with complaints of worsening edema and swelling of the right chest wall and R foot, less so in the L arm, all in the setting of long-term IV drug abuse - blood cultures are pending, also RPR, HIV, hepatitis panel, sed rate - Continue empiric antibiotics  w/ IV vancomycin - May need imaging to r/o underlying abscess - Wound care consulted, appreciate rec's  - cont IVF"s for now - getting only po meds for pain / anxiety (percocet, xanax)  2) Leukocytosis: Acute.  WBC elevated 22.5 on admission.  Lactic acid was noted to be reassuring. - will follow   3) IVDU: Reports history of IV use of cocaine. - drug screen + for everything but barbituates (+amphetamines/ BZD/ opiates/ cocaine/ THC) - Echocardiogram today > showed no obvious vegetation fortunately - Social work consult for current substance abuse  4) Anxiety and depression - Continue Xanax prn, Seroquel, and Prozac  5) Hypokalemia - replace , recheck   DVT prophylaxis:  lovenox   Code Status: Full Family Communication: No family present at bedside Disposition Plan: To be determined Consults called: none Admission status: inpatient   Vinson Moselleob Jmichael Gille MD [facility] pgr [phone] 11/19/2017, 5:11 PM   Recent Labs  Lab 11/18/17 1749 11/19/17 0840  NA 138 134*  K 3.5 3.7  CL 100* 98*  CO2 23 25  GLUCOSE 131* 120*  BUN 20 21*  CREATININE 0.73 0.66  CALCIUM 8.6* 8.4*   Recent Labs  Lab 11/18/17 1749  AST 22  ALT 18  ALKPHOS 84  BILITOT 0.6  PROT 7.4  ALBUMIN 3.0*   Recent Labs  Lab 11/18/17 1749 11/18/17 1922 11/19/17 0840  WBC 22.5* 22.8* 26.1*  NEUTROABS  --  20.5*  --   HGB 14.5 14.7 13.3  HCT 43.5 44.2 40.6  MCV 88.4 87.2 88.8  PLT 206 214 243   Iron/TIBC/Ferritin/ %Sat No results found for: IRON, TIBC, FERRITIN, IRONPCTSAT

## 2017-11-19 NOTE — Care Management Note (Signed)
Case Management Note  Patient Details  Name: Jerrell BelfastStacy Leavens MRN: [id number] Date of Birth: 08/24/1980  Subjective/Objective:   Pt admitted with Cellulitis to right thigh & leg.Patient admits to IV drug use of cocaine with last use a few days ago.  She has had a wound of her right inner thigh where she previously injected.                     Action/Plan: At present time plan to discharge home with no needs.    Expected Discharge Date:    11/20/17           Expected Discharge Plan:  Home/Self Care  In-House Referral:     Discharge planning Services  CM Consult  Post Acute Care Choice:    Choice offered to:     DME Arranged:    DME Agency:     HH Arranged:    HH Agency:     Status of Service:  Completed, signed off  If discussed at [employer] Length of Stay Meetings, dates discussed:    Additional CommentsGeni Bers:  Emira Eubanks, RN 11/19/2017, 2:53 PM

## 2017-11-19 NOTE — Progress Notes (Signed)
Per lab, unable to get blood from pt to draw labs despite several attempts. MD on call, Katrinka BlazingSmith, paged regarding possibility of PICC placement. Advised to wait until morning rounds to determine pt's length of stay and need for a PICC.

## 2017-11-19 NOTE — Progress Notes (Addendum)
Pt found with unlabeled pill bottle in her hand this morning. Pt's visitors state that it is pt's prescribed Robaxin. Pt is drowsy but arousable at this time. Safety related to medication interactions and policy regarding only taking medications ordered by a physician and verified by pharmacy reviewed with pt and visitors. Medication verified and sent to pharmacy.  Tele sitter ordered per nursing protocol and [facility] recommendation.

## 2017-11-19 NOTE — Plan of Care (Signed)
  Problem: Education: Goal: Knowledge of General Education information will improve Outcome: Progressing   Problem: Health Behavior/Discharge Planning: Goal: Ability to manage health-related needs will improve Outcome: Progressing   Problem: Nutrition: Goal: Adequate nutrition will be maintained Outcome: Progressing   Problem: Elimination: Goal: Will not experience complications related to urinary retention Outcome: Progressing   Problem: Pain Managment: Goal: General experience of comfort will improve Outcome: Progressing   Problem: Safety: Goal: Ability to remain free from injury will improve Outcome: Progressing   

## 2017-11-19 NOTE — Progress Notes (Signed)
  Echocardiogram 2D Echocardiogram has been performed.  [name], Stacey Martinez 11/19/2017, 3:06 PM

## 2017-11-19 NOTE — Plan of Care (Signed)
  Problem: Education: Goal: Knowledge of General Education information will improve Outcome: Progressing   Problem: Health Behavior/Discharge Planning: Goal: Ability to manage health-related needs will improve Outcome: Progressing   Problem: Pain Managment: Goal: General experience of comfort will improve Outcome: Progressing   Problem: Safety: Goal: Ability to remain free from injury will improve Outcome: Progressing   

## 2017-11-20 ENCOUNTER — Inpatient Hospital Stay (HOSPITAL_COMMUNITY): Payer: Medicaid Other

## 2017-11-20 DIAGNOSIS — L03031 Cellulitis of right toe: Secondary | ICD-10-CM

## 2017-11-20 LAB — CBC
HEMATOCRIT: 37.4 % (ref 36.0–46.0)
Hemoglobin: 12.3 g/dL (ref 12.0–15.0)
MCH: 29.1 pg (ref 26.0–34.0)
MCHC: 32.9 g/dL (ref 30.0–36.0)
MCV: 88.6 fL (ref 78.0–100.0)
Platelets: 265 10*3/uL (ref 150–400)
RBC: 4.22 MIL/uL (ref 3.87–5.11)
RDW: 13.9 % (ref 11.5–15.5)
WBC: 19.4 10*3/uL — AB (ref 4.0–10.5)

## 2017-11-20 LAB — BASIC METABOLIC PANEL
ANION GAP: 10 (ref 5–15)
BUN: 12 mg/dL (ref 6–20)
CO2: 26 mmol/L (ref 22–32)
Calcium: 8 mg/dL — ABNORMAL LOW (ref 8.9–10.3)
Chloride: 100 mmol/L — ABNORMAL LOW (ref 101–111)
Creatinine, Ser: 0.67 mg/dL (ref 0.44–1.00)
GFR calc Af Amer: >60 mL/min (ref >60)
GFR calc non Af Amer: >60 mL/min (ref >60)
GLUCOSE: 120 mg/dL — AB (ref 65–99)
POTASSIUM: 3.4 mmol/L — AB (ref 3.5–5.1)
Sodium: 136 mmol/L (ref 135–145)

## 2017-11-20 LAB — HEPATITIS PANEL, ACUTE
HEP A IGM: NEGATIVE
Hep B C IgM: NEGATIVE
Hepatitis B Surface Ag: NEGATIVE

## 2017-11-20 LAB — RPR: RPR: NONREACTIVE

## 2017-11-20 LAB — HIV ANTIBODY (ROUTINE TESTING W REFLEX): HIV Screen 4th Generation wRfx: NONREACTIVE

## 2017-11-20 MED ORDER — ENSURE ENLIVE PO LIQD
237.0000 mL | Freq: Two times a day (BID) | ORAL | Status: DC
  Administered 2017-11-21 – 2017-11-30 (×7): 237 mL via ORAL

## 2017-11-20 MED ORDER — SENNOSIDES-DOCUSATE SODIUM 8.6-50 MG PO TABS
2.0000 | ORAL_TABLET | Freq: Every day | ORAL | Status: DC
  Administered 2017-11-20 – 2017-11-21 (×2): 2 via ORAL
  Filled 2017-11-20 (×2): qty 2

## 2017-11-20 MED ORDER — MORPHINE SULFATE (PF) 4 MG/ML IV SOLN
2.0000 mg | INTRAVENOUS | Status: DC | PRN
Start: 2017-11-20 — End: 2017-11-28
  Administered 2017-11-20 – 2017-11-28 (×33): 2 mg via INTRAVENOUS
  Filled 2017-11-20 (×35): qty 1

## 2017-11-20 MED ORDER — POLYETHYLENE GLYCOL 3350 17 G PO PACK
17.0000 g | PACK | Freq: Every day | ORAL | Status: DC
  Administered 2017-11-20 – 2017-12-02 (×6): 17 g via ORAL
  Filled 2017-11-20 (×9): qty 1

## 2017-11-20 MED ORDER — VANCOMYCIN HCL IN DEXTROSE 1-5 GM/200ML-% IV SOLN
1000.0000 mg | Freq: Two times a day (BID) | INTRAVENOUS | Status: DC
  Administered 2017-11-20 – 2017-11-24 (×9): 1000 mg via INTRAVENOUS
  Filled 2017-11-20 (×8): qty 200

## 2017-11-20 MED ORDER — PIPERACILLIN-TAZOBACTAM 3.375 G IVPB
3.3750 g | Freq: Three times a day (TID) | INTRAVENOUS | Status: DC
  Administered 2017-11-20 – 2017-11-21 (×3): 3.375 g via INTRAVENOUS
  Filled 2017-11-20 (×4): qty 50

## 2017-11-20 MED ORDER — IOHEXOL 300 MG/ML  SOLN
75.0000 mL | Freq: Once | INTRAMUSCULAR | Status: AC | PRN
  Administered 2017-11-20: 75 mL via INTRAVENOUS

## 2017-11-20 MED ORDER — OXYCODONE HCL 5 MG PO TABS
10.0000 mg | ORAL_TABLET | ORAL | Status: DC | PRN
  Administered 2017-11-20 – 2017-11-27 (×27): 10 mg via ORAL
  Filled 2017-11-20 (×26): qty 2

## 2017-11-20 MED ORDER — GADOBENATE DIMEGLUMINE 529 MG/ML IV SOLN
15.0000 mL | Freq: Once | INTRAVENOUS | Status: AC | PRN
  Administered 2017-11-20: 15 mL via INTRAVENOUS

## 2017-11-20 MED ORDER — ADULT MULTIVITAMIN W/MINERALS CH
1.0000 | ORAL_TABLET | Freq: Every day | ORAL | Status: DC
  Administered 2017-11-20 – 2017-12-02 (×10): 1 via ORAL
  Filled 2017-11-20 (×11): qty 1

## 2017-11-20 NOTE — Progress Notes (Signed)
Initial Nutrition Assessment  DOCUMENTATION CODES:   Not applicable  INTERVENTION:    Ensure Enlive po BID, each supplement provides 350 kcal and 20 grams of protein  Provide MVI daily  NUTRITION DIAGNOSIS:   Increased nutrient needs related to wound healing as evidenced by estimated needs.  GOAL:   Patient will meet greater than or equal to 90% of their needs  MONITOR:   PO intake, Supplement acceptance, Weight trends, Labs, Skin, I & O's  REASON FOR ASSESSMENT:   Malnutrition Screening Tool    ASSESSMENT:   Patient with PMH significant for IVDA of cocaine  and mood disorder. Presents this admission with right inner thigh where she previously injected and cellulitis of the right foot.    Spoke with pt at bedside. Appetite has remained poor for "months" due to ongoing drug use. Pt states when she uses, her appetite decreases and she can only tolerate one meal per day that consist of cereal or sandwiches. She has remained lethargic throughout the day during admission and has not consumed much. Meal completions charted as 0% for her two meals. Discussed the importance of protein intake for wound healing. Pt amendable to Ensure but refused Juven. RD to order, along with MVI.   Pt endorses a UBW of 170 lb, the last time being at that weight three months ago. Records are limited in weight history but [facility]. This weight looks to be stated, making it difficult to quantify actual weight los.  Nutrition-Focused physical exam completed.   Medications reviewed and include: IV abx Labs reviewed: K 3.4 (L)   NUTRITION - FOCUSED PHYSICAL EXAM:    Most Recent Value  Orbital Region  No depletion  Upper Arm Region  No depletion  Thoracic and Lumbar Region  Unable to assess  Buccal Region  No depletion  Temple Region  Moderate depletion  Clavicle Bone Region  No depletion  Clavicle and Acromion Bone Region  No depletion  Scapular Bone Region   Unable to assess  Dorsal Hand  No depletion  Patellar Region  No depletion  Anterior Thigh Region  No depletion  Posterior Calf Region  No depletion  Edema (RD Assessment)  Mild     Diet Order:  Diet regular Room service appropriate? Yes; Fluid consistency: Thin  EDUCATION NEEDS:   Education needs have been addressed  Skin:  Skin Assessment: Skin Integrity Issues: Skin Integrity Issues:: Other (Comment) Other: right leg wound from injection site  Last BM:  11/18/17  Height:   Ht Readings from Last 1 Encounters:  11/18/17 5\' 4"  (1.626 m)    Weight:   Wt Readings from Last 1 Encounters:  11/18/17 152 lb 3.2 oz (69 kg)    Ideal Body Weight:  54.5 kg  BMI:  Body mass index is 26.13 kg/m.  Estimated Nutritional Needs:   Kcal:  1750-1950 kcal  Protein:  85-95 g  Fluid:  >1.7 L/day    Vanessa Kickarly Meric Joye RD, LDN Clinical Nutrition Pager # [phone]

## 2017-11-20 NOTE — Progress Notes (Addendum)
PROGRESS NOTE        PATIENT DETAILS Name: Stacey Martinez Age: 37 y.o. Sex: female Date of Birth: 11/07/1980 Admit Date: 11/18/2017 Admitting Physician Clydie Braun, MD PCP:Lee, Estelle Grumbles, MD  Brief Narrative: Patient is a 37 y.o. female with history of IVDA (mostly cocaine, intermittently heroin)-presented with fever, worsening right foot/ankle pain, and erythema/swelling in a area in the lower part of her right neck-she was thought to have soft tissue infection/cellulitis-and started on vancomycin-however in spite of being on antimicrobial therapy for 48 hours-patient continues to have persistent fever, and worsening swelling of the right foot highly concerning for underlying septic arthritis, and a abscess in the lower part of right neck area.  See below for further details   Subjective: Continues to have significant pain/erythema and worsening swelling of the right foot.  Area in the right neck appears to be significantly indurated and tender as well.  Claims she is not able to bear weight on the right leg due to significant pain in the right foot.   Assessment/Plan: Right foot cellulitis-with concern for septic arthritis: In spite of being on IV vancomycin for 48 hours-continues to have fever, leukocytosis and worsening erythema and swelling.  She has diffuse swelling of her right foot-ankle area is exquisitely tender-due to significant tenderness-there is no passive motion at all.  Very concerned about concomitant septic arthritis at this point-have obtained 2 view ankle x-ray, and orthopedic evaluation.  Recommendations from orthopedics are to transfer to [facility] for possible irrigation and debridement of the right ankle joint on Friday, orthopedics has broadened antimicrobial spectrum by adding Zosyn as well.  MRI of the right ankle also has been ordered by Ortho-pending at this point. Thankfully blood cultures are negative, echocardiogram does not  show any evidence of vegetation.  Right upper chest wall/lower right neck swelling/erythema-?  Abscess: Obtaining CT of the chest/neck to demarcate whether this is an abscess or just an indurated area/phlegmon.  If an abscess is found on imaging studies-we will obtain a surgical consultation.  For now continue with broad-spectrum antimicrobial therapy  Addendum 2:15 pm CT Neck shows a supraclavicular fossa abscess-on Vanco/Zosyn-[facility] consulted for I&D  IV drug use: Counseled-she mostly does cocaine and occasionally heroin.  Given significant amount of pain-we will adjust her narcotic regimen.  Patient is aware she will not be provided narcotics on discharge.  She apparently used to follow with pain management in the past.  Anxiety/depression: Anxiety has worsened due to above acute illnesses-but for now we will continue with Xanax as needed, and also would continue with fluoxetine and Seroquel.  DVT Prophylaxis: Prophylactic Lovenox   Code Status: Full code   Family Communication: None at bedside  Disposition Plan: Remain inpatient-likely will need transfer to [facility]  Antimicrobial agents: Anti-infectives (From admission, onward)   Start     Dose/Rate Route Frequency Ordered Stop   11/20/17 1600  vancomycin (VANCOCIN) IVPB 1000 mg/200 mL premix     1,000 mg 200 mL/hr over 60 Minutes Intravenous Every 12 hours 11/20/17 0755     11/20/17 1400  piperacillin-tazobactam (ZOSYN) IVPB 3.375 g     3.375 g 12.5 mL/hr over 240 Minutes Intravenous Every 8 hours 11/20/17 1237     11/19/17 0400  vancomycin (VANCOCIN) IVPB 750 mg/150 ml premix  Status:  Discontinued     750 mg  150 mL/hr over 60 Minutes Intravenous Every 12 hours 11/18/17 2207 11/20/17 0755   11/18/17 1800  vancomycin (VANCOCIN) IVPB 1000 mg/200 mL premix     1,000 mg 200 mL/hr over 60 Minutes Intravenous  Once 11/18/17 1758 11/18/17 2021      Procedures: None  CONSULTS:  orthopedic surgery  Time  spent: 25- minutes-Greater than 50% of this time was spent in counseling, explanation of diagnosis, planning of further management, and coordination of care.  MEDICATIONS: Scheduled Meds: . enoxaparin (LOVENOX) injection  40 mg Subcutaneous Q24H  . FLUoxetine  20 mg Oral Daily  . gabapentin  600 mg Oral QID  . mouth rinse  15 mL Mouth Rinse BID  . mupirocin ointment   Topical Daily  . nicotine  21 mg Transdermal Daily  . polyethylene glycol  17 g Oral Daily  . QUEtiapine  25-50 mg Oral QHS  . senna-docusate  2 tablet Oral QHS  . traZODone  150 mg Oral QHS   Continuous Infusions: . sodium chloride 75 mL/hr at 11/20/17 0428  . piperacillin-tazobactam (ZOSYN)  IV    . vancomycin     PRN Meds:.acetaminophen **OR** acetaminophen, albuterol, ALPRAZolam, methocarbamol, morphine injection, ondansetron **OR** ondansetron (ZOFRAN) IV, oxyCODONE   PHYSICAL EXAM: Vital signs: Vitals:   11/19/17 1510 11/19/17 2038 11/20/17 0430 11/20/17 0616  BP: 115/65 (!) 142/81 111/71   Pulse: 71 88 90   Resp: 20 17 18    Temp: 97.8 F (36.6 C) 100 F (37.8 C) (!) 101 F (38.3 C) 99.2 F (37.3 C)  TempSrc: Oral Oral Oral Oral  SpO2: 93% 97% 94%   Weight:      Height:       Filed Weights   11/18/17 2152  Weight: 69 kg (152 lb 3.2 oz)   Body mass index is 26.13 kg/m.   General appearance :Awake, alert, not in any distress. Speech Clear.  Eyes:, pupils equally reactive to light and accomodation,no scleral icterus. HEENT: Atraumatic and Normocephalic Neck: supple, no JVD. No cervical lymphadenopathy.  Indurated area with erythema in the lower part of the right neck around the clavicle. Resp:Good air entry bilaterally, no added sounds  CVS: S1 S2 regular, no murmurs.  GI: Bowel sounds present, Non tender and not distended with no gaurding, rigidity or rebound.No organomegaly Extremities: B/L Lower Ext shows no edema, both legs are warm to touch.  Right foot including ankle is exquisitely  tender-diffusely erythematous, and swollen.  Unable to perform any further exam due to significant tenderness. Neurology:  speech clear,Non focal, sensation is grossly intact. Psychiatric: Normal judgment and insight. Alert and oriented x 3. Normal mood. Musculoskeletal:No digital cyanosis Skin:No Rash, warm and dry Wounds:N/A  I have personally reviewed following labs and imaging studies  LABORATORY DATA: CBC: Recent Labs  Lab 11/18/17 1749 11/18/17 1922 11/19/17 0840 11/20/17 0438  WBC 22.5* 22.8* 26.1* 19.4*  NEUTROABS  --  20.5*  --   --   HGB 14.5 14.7 13.3 12.3  HCT 43.5 44.2 40.6 37.4  MCV 88.4 87.2 88.8 88.6  PLT 206 214 243 265    Basic Metabolic Panel: Recent Labs  Lab 11/18/17 1749 11/19/17 0840 11/20/17 0438  NA 138 134* 136  K 3.5 3.7 3.4*  CL 100* 98* 100*  CO2 23 25 26   GLUCOSE 131* 120* 120*  BUN 20 21* 12  CREATININE 0.73 0.66 0.67  CALCIUM 8.6* 8.4* 8.0*    GFR: Estimated Creatinine Clearance: 90.9 mL/min (by C-G formula based on SCr  of 0.67 mg/dL).  Liver Function Tests: Recent Labs  Lab 11/18/17 1749  AST 22  ALT 18  ALKPHOS 84  BILITOT 0.6  PROT 7.4  ALBUMIN 3.0*   Recent Labs  Lab 11/18/17 1749  LIPASE 20   No results for input(s): AMMONIA in the last 168 hours.  Coagulation Profile: No results for input(s): INR, PROTIME in the last 168 hours.  Cardiac Enzymes: No results for input(s): CKTOTAL, CKMB, CKMBINDEX, TROPONINI in the last 168 hours.  BNP (last 3 results) No results for input(s): PROBNP in the last 8760 hours.  HbA1C: No results for input(s): HGBA1C in the last 72 hours.  CBG: No results for input(s): GLUCAP in the last 168 hours.  Lipid Profile: No results for input(s): CHOL, HDL, LDLCALC, TRIG, CHOLHDL, LDLDIRECT in the last 72 hours.  Thyroid Function Tests: No results for input(s): TSH, T4TOTAL, FREET4, T3FREE, THYROIDAB in the last 72 hours.  Anemia Panel: No results for input(s): VITAMINB12,  FOLATE, FERRITIN, TIBC, IRON, RETICCTPCT in the last 72 hours.  Urine analysis:    Component Value Date/Time   COLORURINE AMBER (A) 11/19/2017 1349   APPEARANCEUR HAZY (A) 11/19/2017 1349   LABSPEC 1.032 (H) 11/19/2017 1349   PHURINE 6.0 11/19/2017 1349   GLUCOSEU NEGATIVE 11/19/2017 1349   HGBUR NEGATIVE 11/19/2017 1349   BILIRUBINUR SMALL (A) 11/19/2017 1349   KETONESUR 5 (A) 11/19/2017 1349   PROTEINUR 100 (A) 11/19/2017 1349   NITRITE NEGATIVE 11/19/2017 1349   LEUKOCYTESUR NEGATIVE 11/19/2017 1349    Sepsis Labs: Lactic Acid, Venous    Component Value Date/Time   LATICACIDVEN 1.48 11/18/2017 1904    MICROBIOLOGY: Recent Results (from the past 240 hour(s))  Blood culture (routine x 2)     Status: None (Preliminary result)   Collection Time: 11/18/17  7:04 PM  Result Value Ref Range Status   Specimen Description   Final    BLOOD LEFT ARM Performed at [facility], [address] [place], [place] [address]    Special Requests   Final    BOTTLES DRAWN AEROBIC AND ANAEROBIC Blood Culture adequate volume Performed at [facility], [address], [place], [place] [address]    Culture   Final    NO GROWTH < 24 HOURS Performed at [facility], [address] [place], [place] [address]    Report Status PENDING  Incomplete  Blood culture (routine x 2)     Status: None (Preliminary result)   Collection Time: 11/18/17  7:04 PM  Result Value Ref Range Status   Specimen Description   Final    BLOOD LEFT FOREARM Performed at [facility], [address] [place], [place] [address]    Special Requests   Final    BOTTLES DRAWN AEROBIC AND ANAEROBIC Blood Culture adequate volume Performed at [facility], [address], [place], [place] [address]    Culture   Final    NO GROWTH < 12 HOURS Performed at [facility], [address] [place], [place] [address]    Report Status PENDING   Incomplete    RADIOLOGY STUDIES/RESULTS: Dg Chest 2 View  Result Date: 11/18/2017 CLINICAL DATA:  Nausea and chills for 4 days. EXAM: CHEST - 2 VIEW COMPARISON:  None. FINDINGS: The heart size and mediastinal contours are within normal limits. Both lungs are clear. The visualized skeletal structures are unremarkable. IMPRESSION: Negative.  No active cardiopulmonary disease. Electronically Signed   By:  Myles Rosenthal M.D.   On: 11/18/2017 19:27   Dg Ankle 2 Views Right  Result Date: 11/20/2017 CLINICAL DATA:  Ankle pain and swelling following injection EXAM: RIGHT ANKLE - 2 VIEW COMPARISON:  None. FINDINGS: Diffuse soft tissue swelling is noted about the ankle consistent with the given clinical history. No acute fracture or dislocation is noted. No bony erosive changes are seen. IMPRESSION: Diffuse soft tissue swelling without acute bony abnormality. Electronically Signed   By: Alcide Clever M.D.   On: 11/20/2017 09:46     LOS: 2 days   Jeoffrey Massed, MD  [facility] Pager:[phone]  If 7PM-7AM, please contact night-coverage [email or web address] Password [email or web address] 11/20/2017, 1:03 PM

## 2017-11-20 NOTE — Progress Notes (Addendum)
Pharmacy Antibiotic Note  Stacey BelfastStacy Donaway is a 37 y.o. female with PMH significant for IV cocaine use admitted on 11/18/2017 with cellulitis.  Pharmacy has been consulted for Vancomycin dosing. 11/20/2017  T max 101, WBC coming down but still elevated at 19.4, echo neg for vegetations. SCr 0.67. Sed rate elevated 4/9. HIV/RPR/Hep negative.   Plan: Increase vancomycin to 1 gm IV q12 for est AUC 518 using SCr 0.67 Monitor renal function, cultures, clinical course.  Vancomycin levels if needed  Height: 5\' 4"  (162.6 cm) Weight: 152 lb 3.2 oz (69 kg) IBW/kg (Calculated) : 54.7  Temp (24hrs), Avg:99.5 F (37.5 C), Min:97.8 F (36.6 C), Max:101 F (38.3 C)  Recent Labs  Lab 11/18/17 1749 11/18/17 1904 11/18/17 1922 11/19/17 0840 11/20/17 0438  WBC 22.5*  --  22.8* 26.1* 19.4*  CREATININE 0.73  --   --  0.66 0.67  LATICACIDVEN  --  1.48  --   --   --     Estimated Creatinine Clearance: 90.9 mL/min (by C-G formula based on SCr of 0.67 mg/dL).    Allergies  Allergen Reactions  . Doxycycline     blisters  . Phenytoin Sodium Extended Other (See Comments)    Seizure  Antimicrobials this admission: 4/8 >> Vancomycin >>  Dose adjustments this admission: 4/10 750 q12>>1 gm q12   Microbiology results: 4/8 BCx: ngtd HIV>neg RPR>neg Hep>neg  Thank you for allowing pharmacy to be a part of this patient's care.  Herby AbrahamMichelle T. Elisha Cooksey, Pharm.D. [phone] 11/20/2017 7:59 AM   Addendum: Adding zosyn for gram negative coverage Zosyn 3.375 gm IV q8h, infuse each dose over 4 hours  Herby AbrahamMichelle T. Carlitos Bottino, Pharm.D. [phone] 11/20/2017 1:04 PM

## 2017-11-20 NOTE — Consult Note (Signed)
Reason for Consult:   Referring Physician:  Nena Alexander Chief complaint: Nausea vomiting diarrhea chills with cellulitis right leg cocaine injection site  Stacey Martinez is an 37 y.o. female.  HPI: Patient is a 37 year old female with a history of significant IV drug abuse.  She has history of cocaine use and mood disorder.  She presents with a wound in her right inner thigh where she previously injected that is improving.  Over the last 2-1/2 weeks she has developed redness and swelling of her right ankle making it progressively more difficult to ambulate.  She is also having pain in her right chest wall swelling and redness has appeared this is also an injection site.  She developed fever up to 103 at home along with nausea and vomiting and generalized malaise.    Workup in the ED shows she was initially afebrile, but did spike a temperature to 101 early this a.m. at 4. Admission WBC was 22.8, hemoglobin 14 hematocrit 44 platelets 214,000.  Her WBC then went up to 26,000.  And the last one done this a.m. was 19.4.  Admission chemistry shows a sodium of 134, chloride of 98, glucose of 120, BUN of 21 creatinine of 0.66.  Sed rate is 62 RPR is negative hepatitis A,B, and C are negative.  HIV screen is negative.  Urinalysis is nitrate negative, 0-5 white cells and 6-30 red cells.  Drug screen is positive for amphetamines benzodiazepines opiates cocaine and THC. 2 view films of the right ankle shows diffuse soft tissue swelling consistent with clinical history no acute fractures or dislocations no bony erosive changes.  CT of the neck obtained today shows a supraclavicular fossa abscess on the right about 3 x 5 cm in diameter with intramuscular extension into the right sternal cleidomastoid with several loculations. She was admitted by medicine for her septic arthritis and abscess of the lower chest wall and right neck.  She was placed on IV antibiotics, vancomycin and Zosyn.  She has been seen by orthopedics  and is being transferred to [facility].  With plans for MRI of her lower extremity and then incision and drainage in the OR later this week by Dr. Sharol Given.  We are asked to see and evaluate for the neck abscess.  Past Medical History:  Diagnosis Date  . Hx of degenerative disc disease   . Substance abuse ([place])     History reviewed. No pertinent surgical history.  History reviewed. No pertinent family history.  Social History:  reports that she has been smoking cigarettes.  She does not have any smokeless tobacco history on file. She reports that she drank alcohol. She reports that she has current or past drug history.  Allergies:  Allergies  Allergen Reactions  . Doxycycline     blisters  . Phenytoin Sodium Extended Other (See Comments)    Seizure    Medications:  Prior to Admission:  Medications Prior to Admission  Medication Sig Dispense Refill Last Dose  . acetaminophen (TYLENOL) 500 MG tablet Take 1,000 mg by mouth every 4 (four) hours as needed for moderate pain.   11/17/2017 at Unknown time  . ALPRAZolam (XANAX) 1 MG tablet Take 1 mg by mouth 2 (two) times daily as needed for anxiety.   0 Past Week at Unknown time  . FLUoxetine (PROZAC) 10 MG capsule Take 20 mg by mouth daily.  5 Past Week at Unknown time  . gabapentin (NEURONTIN) 600 MG tablet Take 600 mg by mouth 4 (four)  times daily.  0 Past Week at Unknown time  . ibuprofen (ADVIL,MOTRIN) 200 MG tablet Take 800 mg by mouth every 4 (four) hours as needed for moderate pain.   11/17/2017 at Unknown time  . methocarbamol (ROBAXIN) 500 MG tablet Take 1,500-2,000 mg by mouth 4 (four) times daily.  1 Past Week at Unknown time  . QUEtiapine (SEROQUEL) 25 MG tablet Take 25-50 mg by mouth at bedtime.  5 11/17/2017 at Unknown time  . traZODone (DESYREL) 150 MG tablet Take 150 mg by mouth at bedtime.  2 11/17/2017 at Unknown time   Scheduled: . enoxaparin (LOVENOX) injection  40 mg Subcutaneous Q24H  . feeding supplement (ENSURE  ENLIVE)  237 mL Oral BID BM  . FLUoxetine  20 mg Oral Daily  . gabapentin  600 mg Oral QID  . mouth rinse  15 mL Mouth Rinse BID  . multivitamin with minerals  1 tablet Oral Daily  . mupirocin ointment   Topical Daily  . nicotine  21 mg Transdermal Daily  . polyethylene glycol  17 g Oral Daily  . QUEtiapine  25-50 mg Oral QHS  . senna-docusate  2 tablet Oral QHS  . traZODone  150 mg Oral QHS   Continuous: . sodium chloride 75 mL/hr at 11/20/17 0428  . piperacillin-tazobactam (ZOSYN)  IV 3.375 g (11/20/17 1347)  . vancomycin     Anti-infectives (From admission, onward)   Start     Dose/Rate Route Frequency Ordered Stop   11/20/17 1600  vancomycin (VANCOCIN) IVPB 1000 mg/200 mL premix     1,000 mg 200 mL/hr over 60 Minutes Intravenous Every 12 hours 11/20/17 0755     11/20/17 1400  piperacillin-tazobactam (ZOSYN) IVPB 3.375 g     3.375 g 12.5 mL/hr over 240 Minutes Intravenous Every 8 hours 11/20/17 1237     11/19/17 0400  vancomycin (VANCOCIN) IVPB 750 mg/150 ml premix  Status:  Discontinued     750 mg 150 mL/hr over 60 Minutes Intravenous Every 12 hours 11/18/17 2207 11/20/17 0755   11/18/17 1800  vancomycin (VANCOCIN) IVPB 1000 mg/200 mL premix     1,000 mg 200 mL/hr over 60 Minutes Intravenous  Once 11/18/17 1758 11/18/17 2021      Results for orders placed or performed during the hospital encounter of 11/18/17 (from the past 48 hour(s))  Lipase, blood     Status: None   Collection Time: 11/18/17  5:49 PM  Result Value Ref Range   Lipase 20 11 - 51 U/L    Comment: Performed at [facility], [place] [address] [place], [place] [address]  Comprehensive metabolic panel     Status: Abnormal   Collection Time: 11/18/17  5:49 PM  Result Value Ref Range   Sodium 138 135 - 145 mmol/L   Potassium 3.5 3.5 - 5.1 mmol/L   Chloride 100 (L) 101 - 111 mmol/L   CO2 23 22 - 32 mmol/L   Glucose, Bld 131 (H) 65 - 99 mg/dL   BUN 20 6 - 20 mg/dL   Creatinine, Ser 0.73  0.44 - 1.00 mg/dL   Calcium 8.6 (L) 8.9 - 10.3 mg/dL   Total Protein 7.4 6.5 - 8.1 g/dL   Albumin 3.0 (L) 3.5 - 5.0 g/dL   AST 22 15 - 41 U/L   ALT 18 14 - 54 U/L   Alkaline Phosphatase 84 38 - 126 U/L   Total Bilirubin 0.6 0.3 - 1.2 mg/dL   GFR calc non Af Amer >60 >  60 mL/min   GFR calc Af Amer >60 >60 mL/min    Comment: (NOTE) The eGFR has been calculated using the CKD EPI equation. This calculation has not been validated in all clinical situations. eGFR's persistently <60 mL/min signify possible Chronic Kidney Disease.    Anion gap 15 5 - 15    Comment: Performed at [facility], [place] [address], [place], [place] [address]  CBC     Status: Abnormal   Collection Time: 11/18/17  5:49 PM  Result Value Ref Range   WBC 22.5 (H) 4.0 - 10.5 K/uL   RBC 4.92 3.87 - 5.11 MIL/uL   Hemoglobin 14.5 12.0 - 15.0 g/dL   HCT 43.5 36.0 - 46.0 %   MCV 88.4 78.0 - 100.0 fL   MCH 29.5 26.0 - 34.0 pg   MCHC 33.3 30.0 - 36.0 g/dL   RDW 13.6 11.5 - 15.5 %   Platelets 206 150 - 400 K/uL    Comment: Performed at [facility], [place] [address] [place], [place] [address]  I-Stat beta hCG blood, ED     Status: None   Collection Time: 11/18/17  5:55 PM  Result Value Ref Range   I-stat hCG, quantitative <5.0 <5 mIU/mL   Comment 3            Comment:   GEST. AGE      CONC.  (mIU/mL)   <=1 WEEK        5 - 50     2 WEEKS       50 - 500     3 WEEKS       100 - 10,000     4 WEEKS     1,000 - 30,000        FEMALE AND NON-PREGNANT FEMALE:     LESS THAN 5 mIU/mL   Blood culture (routine x 2)     Status: None (Preliminary result)   Collection Time: 11/18/17  7:04 PM  Result Value Ref Range   Specimen Description      BLOOD LEFT ARM Performed at [place] [address], [place], [place] [address]    Special Requests      BOTTLES DRAWN AEROBIC AND ANAEROBIC Blood Culture adequate volume Performed at [place]  [address] [place], [place] [address]    Culture      NO GROWTH < 24 HOURS Performed at [place] [address], [place], [place] [address]    Report Status PENDING   Blood culture (routine x 2)     Status: None (Preliminary result)   Collection Time: 11/18/17  7:04 PM  Result Value Ref Range   Specimen Description      BLOOD LEFT FOREARM Performed at [place] [address], [place], [place] [address]    Special Requests      BOTTLES DRAWN AEROBIC AND ANAEROBIC Blood Culture adequate volume Performed at [place] [address] [place], [place] [address]    Culture      NO GROWTH < 12 HOURS Performed at [place] [address] [place], [place] [address]    Report Status PENDING   I-Stat CG4 Lactic Acid, ED     Status: None   Collection Time: 11/18/17  7:04 PM  Result Value Ref Range   Lactic Acid, Venous 1.48 0.5 - 1.9 mmol/L  CBC with Differential  Status: Abnormal   Collection Time: 11/18/17  7:22 PM  Result Value Ref Range   WBC 22.8 (H) 4.0 - 10.5 K/uL   RBC 5.07 3.87 - 5.11 MIL/uL   Hemoglobin 14.7 12.0 - 15.0 g/dL   HCT 44.2 36.0 - 46.0 %   MCV 87.2 78.0 - 100.0 fL   MCH 29.0 26.0 - 34.0 pg   MCHC 33.3 30.0 - 36.0 g/dL   RDW 13.6 11.5 - 15.5 %   Platelets 214 150 - 400 K/uL   Neutrophils Relative % 90 %   Lymphocytes Relative 6 %   Monocytes Relative 4 %   Eosinophils Relative 0 %   Basophils Relative 0 %   Neutro Abs 20.5 (H) 1.7 - 7.7 K/uL   Lymphs Abs 1.4 0.7 - 4.0 K/uL   Monocytes Absolute 0.9 0.1 - 1.0 K/uL   Eosinophils Absolute 0.0 0.0 - 0.7 K/uL   Basophils Absolute 0.0 0.0 - 0.1 K/uL   WBC Morphology WHITE COUNT CONFIRMED ON SMEAR    Smear Review MORPHOLOGY UNREMARKABLE     Comment: Performed at [facility], [place] [address] [place], [place] [address]  RPR     Status: None   Collection Time: 11/19/17  8:00 AM  Result Value Ref Range   RPR Ser Ql Non  Reactive Non Reactive    Comment: (NOTE) Performed At: [facility] [place], [place] [id number] Rush Farmer MD [id number] Performed at [facility], [place] [address], [place], [place] [address]   HIV antibody     Status: None   Collection Time: 11/19/17  8:00 AM  Result Value Ref Range   HIV Screen 4th Generation wRfx Non Reactive Non Reactive    Comment: (NOTE) Performed At: [facility] [place], [place] [id number] Rush Farmer MD [id number] Performed at [facility], [place] [address] [place], [place] [address]   Hepatitis panel, acute     Status: None   Collection Time: 11/19/17  8:00 AM  Result Value Ref Range   Hepatitis B Surface Ag Negative Negative   HCV Ab <0.1 0.0 - 0.9 s/co ratio    Comment: (NOTE)                                  Negative:     < 0.8                             Indeterminate: 0.8 - 0.9                                  Positive:     > 0.9 The CDC recommends that a positive HCV antibody result be followed up with a HCV Nucleic Acid Amplification test (644034). Performed At: [facility] [place], [place] [id number] Rush Farmer MD [id number]    Hep A IgM Negative Negative   Hep B C IgM Negative Negative    Comment: Performed at [facility], [place] [address], [place], [place] [address]  Sedimentation rate     Status: Abnormal   Collection Time: 11/19/17  8:00 AM  Result Value Ref Range   Sed Rate 82 (H) 0 - 22 mm/hr    Comment: Performed at [facility], [address] [place], [place] [address]  CBC     Status: Abnormal   Collection Time: 11/19/17  8:40 AM  Result Value Ref Range   WBC 26.1 (H) 4.0 - 10.5 K/uL   RBC 4.57 3.87 - 5.11 MIL/uL   Hemoglobin 13.3 12.0 - 15.0 g/dL   HCT 40.6 36.0 - 46.0 %   MCV 88.8 78.0 - 100.0 fL   MCH 29.1 26.0 - 34.0 pg   MCHC 32.8 30.0 - 36.0 g/dL   RDW  14.0 11.5 - 15.5 %   Platelets 243 150 - 400 K/uL    Comment: Performed at [facility], [place] [address] [place], [place] [address]  Basic metabolic panel     Status: Abnormal   Collection Time: 11/19/17  8:40 AM  Result Value Ref Range   Sodium 134 (L) 135 - 145 mmol/L   Potassium 3.7 3.5 - 5.1 mmol/L   Chloride 98 (L) 101 - 111 mmol/L   CO2 25 22 - 32 mmol/L   Glucose, Bld 120 (H) 65 - 99 mg/dL   BUN 21 (H) 6 - 20 mg/dL   Creatinine, Ser 0.66 0.44 - 1.00 mg/dL   Calcium 8.4 (L) 8.9 - 10.3 mg/dL   GFR calc non Af Amer >60 >60 mL/min   GFR calc Af Amer >60 >60 mL/min    Comment: (NOTE) The eGFR has been calculated using the CKD EPI equation. This calculation has not been validated in all clinical situations. eGFR's persistently <60 mL/min signify possible Chronic Kidney Disease.    Anion gap 11 5 - 15    Comment: Performed at [facility], [place] [address], [place], [place] [address]  Urinalysis, Routine w reflex microscopic     Status: Abnormal   Collection Time: 11/19/17  1:49 PM  Result Value Ref Range   Color, Urine AMBER (A) YELLOW    Comment: BIOCHEMICALS MAY BE AFFECTED BY COLOR   APPearance HAZY (A) CLEAR   Specific Gravity, Urine 1.032 (H) 1.005 - 1.030   pH 6.0 5.0 - 8.0   Glucose, UA NEGATIVE NEGATIVE mg/dL   Hgb urine dipstick NEGATIVE NEGATIVE   Bilirubin Urine SMALL (A) NEGATIVE   Ketones, ur 5 (A) NEGATIVE mg/dL   Protein, ur 100 (A) NEGATIVE mg/dL   Nitrite NEGATIVE NEGATIVE   Leukocytes, UA NEGATIVE NEGATIVE   RBC / HPF 6-30 0 - 5 RBC/hpf   WBC, UA 0-5 0 - 5 WBC/hpf   Bacteria, UA FEW (A) NONE SEEN   Squamous Epithelial / LPF 0-5 (A) NONE SEEN   Mucus PRESENT    Budding Yeast PRESENT     Comment: Performed at [facility], [place] [address], [place], [place] [address]  Rapid urine drug screen (hospital performed)     Status: Abnormal   Collection Time: 11/19/17  1:49 PM  Result Value Ref Range    Opiates POSITIVE (A) NONE DETECTED   Cocaine POSITIVE (A) NONE DETECTED   Benzodiazepines POSITIVE (A) NONE DETECTED   Amphetamines POSITIVE (A) NONE DETECTED   Tetrahydrocannabinol POSITIVE (A) NONE DETECTED   Barbiturates NONE DETECTED NONE DETECTED    Comment: (NOTE) DRUG SCREEN FOR MEDICAL PURPOSES ONLY.  IF CONFIRMATION IS NEEDED FOR ANY PURPOSE, NOTIFY LAB WITHIN 5 DAYS. LOWEST DETECTABLE LIMITS FOR URINE DRUG SCREEN Drug Class                     Cutoff (ng/mL) Amphetamine and metabolites    1000  Barbiturate and metabolites    200 Benzodiazepine                 476 Tricyclics and metabolites     300 Opiates and metabolites        300 Cocaine and metabolites        300 THC                            50 Performed at [facility], [place] [address] [place], [place] [address]   CBC     Status: Abnormal   Collection Time: 11/20/17  4:38 AM  Result Value Ref Range   WBC 19.4 (H) 4.0 - 10.5 K/uL   RBC 4.22 3.87 - 5.11 MIL/uL   Hemoglobin 12.3 12.0 - 15.0 g/dL   HCT 37.4 36.0 - 46.0 %   MCV 88.6 78.0 - 100.0 fL   MCH 29.1 26.0 - 34.0 pg   MCHC 32.9 30.0 - 36.0 g/dL   RDW 13.9 11.5 - 15.5 %   Platelets 265 150 - 400 K/uL    Comment: Performed at [facility], [place] [address] [place], [place] [address]  Basic metabolic panel     Status: Abnormal   Collection Time: 11/20/17  4:38 AM  Result Value Ref Range   Sodium 136 135 - 145 mmol/L   Potassium 3.4 (L) 3.5 - 5.1 mmol/L   Chloride 100 (L) 101 - 111 mmol/L   CO2 26 22 - 32 mmol/L   Glucose, Bld 120 (H) 65 - 99 mg/dL   BUN 12 6 - 20 mg/dL   Creatinine, Ser 0.67 0.44 - 1.00 mg/dL   Calcium 8.0 (L) 8.9 - 10.3 mg/dL   GFR calc non Af Amer >60 >60 mL/min   GFR calc Af Amer >60 >60 mL/min    Comment: (NOTE) The eGFR has been calculated using the CKD EPI equation. This calculation has not been validated in all clinical situations. eGFR's persistently <60 mL/min signify possible Chronic  Kidney Disease.    Anion gap 10 5 - 15    Comment: Performed at [facility], [place] [address], [place], [place] [address]    Dg Chest 2 View  Result Date: 11/18/2017 CLINICAL DATA:  Nausea and chills for 4 days. EXAM: CHEST - 2 VIEW COMPARISON:  None. FINDINGS: The heart size and mediastinal contours are within normal limits. Both lungs are clear. The visualized skeletal structures are unremarkable. IMPRESSION: Negative.  No active cardiopulmonary disease. Electronically Signed   By: Earle Gell M.D.   On: 11/18/2017 19:27   Dg Ankle 2 Views Right  Result Date: 11/20/2017 CLINICAL DATA:  Ankle pain and swelling following injection EXAM: RIGHT ANKLE - 2 VIEW COMPARISON:  None. FINDINGS: Diffuse soft tissue swelling is noted about the ankle consistent with the given clinical history. No acute fracture or dislocation is noted. No bony erosive changes are seen. IMPRESSION: Diffuse soft tissue swelling without acute bony abnormality. Electronically Signed   By: Inez Catalina M.D.   On: 11/20/2017 09:46   Ct Soft Tissue Neck W Contrast  Result Date: 11/20/2017 CLINICAL DATA:  History of drug use. Inflamed area at the base of the neck on the right. EXAM: CT NECK WITH CONTRAST TECHNIQUE: Multidetector CT imaging of the neck was performed using the standard protocol following the bolus administration of intravenous contrast. CONTRAST:  35m OMNIPAQUE IOHEXOL 300 MG/ML  SOLN COMPARISON:  None.  FINDINGS: Pharynx and larynx: No mucosal or submucosal lesion. Salivary glands: Parotid and submandibular glands are normal. Thyroid: Normal Lymph nodes: No enlarged or low-density nodes on either side of the neck. Vascular: Arterial and venous structures are patent. Limited intracranial: Normal Visualized orbits: Not included Mastoids and visualized paranasal sinuses: Clear Skeleton: Negative Upper chest: Negative Other: There is an abscess in the right supraclavicular fossa region measuring 3 x 5 cm,  with extension into the sternocleidomastoid muscle and a series of intramuscular abscesses. The highest abscess at the C3 level is small, measuring only 5 mm in diameter. Middle abscess at the C4-5 level shows a diameter of 1.3 cm. The lower abscess in communication with the supraclavicular abscess measures about 2 cm in diameter. IMPRESSION: Supraclavicular fossa abscess on the right measuring about 3 x 5 cm in diameter. Intramuscular extension of abscess into the right sternocleidomastoid muscle with several loculations. Electronically Signed   By: Nelson Chimes M.D.   On: 11/20/2017 13:13    Review of Systems  Constitutional: Positive for chills and fever.  HENT: Negative.   Eyes: Negative.   Respiratory: Negative.   Cardiovascular: Positive for leg swelling (RLE).  Gastrointestinal: Negative.   Genitourinary: Negative.   Musculoskeletal: Negative.        RLE very swollen and red picture below  Skin:       Multiple skin lesions from injections  Neurological: Negative.   Endo/Heme/Allergies: Negative.   Psychiatric/Behavioral: Positive for substance abuse.   Blood pressure 119/66, pulse 90, temperature 99.8 F (37.7 C), temperature source Oral, resp. rate 14, height '5\' 4"'  (1.626 m), weight 69 kg (152 lb 3.2 oz), SpO2 95 %. Physical Exam  Constitutional: She is oriented to person, place, and time. She appears well-developed and well-nourished. No distress.  HENT:  Head: Normocephalic and atraumatic.  Mouth/Throat: No oropharyngeal exudate.  Eyes: Right eye exhibits no discharge. Left eye exhibits no discharge. No scleral icterus.  Pupils are equal  Neck: Normal range of motion. Neck supple. No JVD present. No tracheal deviation present. No thyromegaly present.  She has cervical adenopathy going from her sternocleidomastoid to just below the ear on the right.    Cardiovascular: Normal rate, regular rhythm, normal heart sounds and intact distal pulses.  No murmur heard. Respiratory:  Effort normal and breath sounds normal. No respiratory distress. She has no wheezes. She has no rales. She exhibits no tenderness.  GI: Soft. Bowel sounds are normal. She exhibits no distension and no mass. There is no tenderness. There is no rebound and no guarding.  Musculoskeletal: She exhibits edema (RLE) and tenderness (RLE).  She has multiple need scars and non healing/poorly healing ulcers both upper extremities. Septic appearing arthritis RLE with swelling from the foot to above the ankle.   Lymphadenopathy:    She has cervical adenopathy.  Neurological: She is alert and oriented to person, place, and time. No cranial nerve deficit.  Skin: Skin is warm and dry. No rash noted. She is not diaphoretic. There is erythema. There is pallor.  See pictures below  Psychiatric: She has a normal mood and affect. Her behavior is normal. Thought content normal.   7 x 3 cm warm fluctuant area.  Cervical nodes going to just below the ear on the right.   Left arm, non healing ulcers, old scar from prior use   RLE cellulitis/swelling   Assessment/Plan: IV injection site Right supraclavicular fossa abscess with cervical adenopathy Septic arthritis RLE Recurring polysubstance abuse with poor healing  Hx of DDD  Plan:  I agree this site needs to be opened, and it may be prefferable to have ENT surgery look after this.  She is drinking Coke and waiting on MRI currently. Also waiting on bed at [facility].  Dr. Kieth Brightly will review. Agree with broad spectrum antibiotics.  We will follow with you, and understand she is to go to [facility] when bed is available. NPO after MN.  Pt says she cannot tolerate bedside I&D, and wants to be put to sleep.        tJENNINGS,Tiesha Marich 11/20/2017, 2:11 PM

## 2017-11-20 NOTE — Progress Notes (Signed)
CSW attempted to speak with patient again, patient currently out of room. Per patient's RN, patient currently getting a CT scan. CSW will try again later.   Celso SickleKimberly Dariusz Brase, [place] Clinical Social Worker [facility] Cell#: [phone]

## 2017-11-20 NOTE — Progress Notes (Signed)
Pt to be [facility] room 5N20c-01.  Report called to Merriem,RN.

## 2017-11-20 NOTE — Consult Note (Signed)
Reason for Consult:Foot/ankle infection Referring Physician: S Ghimire  Stacey Martinez is an 37 y.o. female.  HPI: Stacey Martinez was admitted 4/8 with multiple wounds. She has been having issues for the last 2-3 weeks but it was her right ankle/foot that moved her to seek care. In addition to pain she had N/V and fevers. She is an active IVDU of cocaine.  Past Medical History:  Diagnosis Date  . Hx of degenerative disc disease   . Substance abuse ([place])     History reviewed. No pertinent surgical history.  History reviewed. No pertinent family history.  Social History:  reports that she has been smoking cigarettes.  She does not have any smokeless tobacco history on file. She reports that she drank alcohol. She reports that she has current or past drug history.  Allergies:  Allergies  Allergen Reactions  . Doxycycline     blisters  . Phenytoin Sodium Extended Other (See Comments)    Seizure    Medications: I have reviewed the patient's current medications.  Results for orders placed or performed during the hospital encounter of 11/18/17 (from the past 48 hour(s))  Lipase, blood     Status: None   Collection Time: 11/18/17  5:49 PM  Result Value Ref Range   Lipase 20 11 - 51 U/L    Comment: Performed at [facility], [place] [address], [place], [place] [address]  Comprehensive metabolic panel     Status: Abnormal   Collection Time: 11/18/17  5:49 PM  Result Value Ref Range   Sodium 138 135 - 145 mmol/L   Potassium 3.5 3.5 - 5.1 mmol/L   Chloride 100 (L) 101 - 111 mmol/L   CO2 23 22 - 32 mmol/L   Glucose, Bld 131 (H) 65 - 99 mg/dL   BUN 20 6 - 20 mg/dL   Creatinine, Ser 0.73 0.44 - 1.00 mg/dL   Calcium 8.6 (L) 8.9 - 10.3 mg/dL   Total Protein 7.4 6.5 - 8.1 g/dL   Albumin 3.0 (L) 3.5 - 5.0 g/dL   AST 22 15 - 41 U/L   ALT 18 14 - 54 U/L   Alkaline Phosphatase 84 38 - 126 U/L   Total Bilirubin 0.6 0.3 - 1.2 mg/dL   GFR calc non Af Amer >60 >60 mL/min   GFR calc  Af Amer >60 >60 mL/min    Comment: (NOTE) The eGFR has been calculated using the CKD EPI equation. This calculation has not been validated in all clinical situations. eGFR's persistently <60 mL/min signify possible Chronic Kidney Disease.    Anion gap 15 5 - 15    Comment: Performed at [facility], [place] [address] [place], [place] [address]  CBC     Status: Abnormal   Collection Time: 11/18/17  5:49 PM  Result Value Ref Range   WBC 22.5 (H) 4.0 - 10.5 K/uL   RBC 4.92 3.87 - 5.11 MIL/uL   Hemoglobin 14.5 12.0 - 15.0 g/dL   HCT 43.5 36.0 - 46.0 %   MCV 88.4 78.0 - 100.0 fL   MCH 29.5 26.0 - 34.0 pg   MCHC 33.3 30.0 - 36.0 g/dL   RDW 13.6 11.5 - 15.5 %   Platelets 206 150 - 400 K/uL    Comment: Performed at [facility], [place] [address] [place], [place] [address]  I-Stat beta hCG blood, ED     Status: None   Collection Time: 11/18/17  5:55 PM  Result Value Ref  Range   I-stat hCG, quantitative <5.0 <5 mIU/mL   Comment 3            Comment:   GEST. AGE      CONC.  (mIU/mL)   <=1 WEEK        5 - 50     2 WEEKS       50 - 500     3 WEEKS       100 - 10,000     4 WEEKS     1,000 - 30,000        FEMALE AND NON-PREGNANT FEMALE:     LESS THAN 5 mIU/mL   Blood culture (routine x 2)     Status: None (Preliminary result)   Collection Time: 11/18/17  7:04 PM  Result Value Ref Range   Specimen Description      BLOOD LEFT ARM Performed at [place] [address] [place], [place] [address]    Special Requests      BOTTLES DRAWN AEROBIC AND ANAEROBIC Blood Culture adequate volume Performed at [place] [address], [place], [place] [address]    Culture      NO GROWTH < 24 HOURS Performed at [place] [address], [place], [place] [address]    Report Status PENDING   Blood culture (routine x 2)     Status: None (Preliminary result)   Collection Time: 11/18/17  7:04 PM  Result Value  Ref Range   Specimen Description      BLOOD LEFT FOREARM Performed at [place] [address], [place], [place] [address]    Special Requests      BOTTLES DRAWN AEROBIC AND ANAEROBIC Blood Culture adequate volume Performed at [place] [address] [place], [place] [address]    Culture      NO GROWTH < 12 HOURS Performed at [place] [address] [place], [place] [address]    Report Status PENDING   I-Stat CG4 Lactic Acid, ED     Status: None   Collection Time: 11/18/17  7:04 PM  Result Value Ref Range   Lactic Acid, Venous 1.48 0.5 - 1.9 mmol/L  CBC with Differential     Status: Abnormal   Collection Time: 11/18/17  7:22 PM  Result Value Ref Range   WBC 22.8 (H) 4.0 - 10.5 K/uL   RBC 5.07 3.87 - 5.11 MIL/uL   Hemoglobin 14.7 12.0 - 15.0 g/dL   HCT 44.2 36.0 - 46.0 %   MCV 87.2 78.0 - 100.0 fL   MCH 29.0 26.0 - 34.0 pg   MCHC 33.3 30.0 - 36.0 g/dL   RDW 13.6 11.5 - 15.5 %   Platelets 214 150 - 400 K/uL   Neutrophils Relative % 90 %   Lymphocytes Relative 6 %   Monocytes Relative 4 %   Eosinophils Relative 0 %   Basophils Relative 0 %   Neutro Abs 20.5 (H) 1.7 - 7.7 K/uL   Lymphs Abs 1.4 0.7 - 4.0 K/uL   Monocytes Absolute 0.9 0.1 - 1.0 K/uL   Eosinophils Absolute 0.0 0.0 - 0.7 K/uL   Basophils Absolute 0.0 0.0 - 0.1 K/uL   WBC Morphology WHITE COUNT CONFIRMED ON SMEAR    Smear Review MORPHOLOGY UNREMARKABLE     Comment: Performed at [facility], [place] [address] [place], [place] [address]  RPR     Status:  None   Collection Time: 11/19/17  8:00 AM  Result Value Ref Range   RPR Ser Ql Non Reactive Non Reactive    Comment: (NOTE) Performed At: [facility] [place], [place] [id number] Rush Farmer MD [id number] Performed at [facility], [place] [address], [place], [place] [address]   HIV antibody     Status: None   Collection Time: 11/19/17   8:00 AM  Result Value Ref Range   HIV Screen 4th Generation wRfx Non Reactive Non Reactive    Comment: (NOTE) Performed At: [facility] [place], [place] [id number] Rush Farmer MD [id number] Performed at [facility], [place] [address], [place], [place] [address]   Hepatitis panel, acute     Status: None   Collection Time: 11/19/17  8:00 AM  Result Value Ref Range   Hepatitis B Surface Ag Negative Negative   HCV Ab <0.1 0.0 - 0.9 s/co ratio    Comment: (NOTE)                                  Negative:     < 0.8                             Indeterminate: 0.8 - 0.9                                  Positive:     > 0.9 The CDC recommends that a positive HCV antibody result be followed up with a HCV Nucleic Acid Amplification test (921194). Performed At: [facility] [place], [place] [id number] Rush Farmer MD [id number]    Hep A IgM Negative Negative   Hep B C IgM Negative Negative    Comment: Performed at [facility], [place] [address] [place], [place] [address]  Sedimentation rate     Status: Abnormal   Collection Time: 11/19/17  8:00 AM  Result Value Ref Range   Sed Rate 82 (H) 0 - 22 mm/hr    Comment: Performed at [facility], [place] [address] [place], [place] [address]  CBC     Status: Abnormal   Collection Time: 11/19/17  8:40 AM  Result Value Ref Range   WBC 26.1 (H) 4.0 - 10.5 K/uL   RBC 4.57 3.87 - 5.11 MIL/uL   Hemoglobin 13.3 12.0 - 15.0 g/dL   HCT 40.6 36.0 - 46.0 %   MCV 88.8 78.0 - 100.0 fL   MCH 29.1 26.0 - 34.0 pg   MCHC 32.8 30.0 - 36.0 g/dL   RDW 14.0 11.5 - 15.5 %   Platelets 243 150 - 400 K/uL    Comment: Performed at [facility], [place] [address], [place], [place] [address]  Basic metabolic panel     Status: Abnormal   Collection Time: 11/19/17  8:40 AM  Result Value Ref Range   Sodium 134 (L) 135 - 145 mmol/L   Potassium  3.7 3.5 - 5.1 mmol/L   Chloride 98 (L) 101 - 111 mmol/L   CO2 25 22 - 32 mmol/L   Glucose, Bld 120 (H) 65 - 99 mg/dL   BUN 21 (H) 6 - 20 mg/dL   Creatinine, Ser 0.66 0.44 - 1.00 mg/dL   Calcium  8.4 (L) 8.9 - 10.3 mg/dL   GFR calc non Af Amer >60 >60 mL/min   GFR calc Af Amer >60 >60 mL/min    Comment: (NOTE) The eGFR has been calculated using the CKD EPI equation. This calculation has not been validated in all clinical situations. eGFR's persistently <60 mL/min signify possible Chronic Kidney Disease.    Anion gap 11 5 - 15    Comment: Performed at [facility], [place] [address] [place], [place] [address]  Urinalysis, Routine w reflex microscopic     Status: Abnormal   Collection Time: 11/19/17  1:49 PM  Result Value Ref Range   Color, Urine AMBER (A) YELLOW    Comment: BIOCHEMICALS MAY BE AFFECTED BY COLOR   APPearance HAZY (A) CLEAR   Specific Gravity, Urine 1.032 (H) 1.005 - 1.030   pH 6.0 5.0 - 8.0   Glucose, UA NEGATIVE NEGATIVE mg/dL   Hgb urine dipstick NEGATIVE NEGATIVE   Bilirubin Urine SMALL (A) NEGATIVE   Ketones, ur 5 (A) NEGATIVE mg/dL   Protein, ur 100 (A) NEGATIVE mg/dL   Nitrite NEGATIVE NEGATIVE   Leukocytes, UA NEGATIVE NEGATIVE   RBC / HPF 6-30 0 - 5 RBC/hpf   WBC, UA 0-5 0 - 5 WBC/hpf   Bacteria, UA FEW (A) NONE SEEN   Squamous Epithelial / LPF 0-5 (A) NONE SEEN   Mucus PRESENT    Budding Yeast PRESENT     Comment: Performed at [facility], [place] [address], [place], [place] [address]  Rapid urine drug screen (hospital performed)     Status: Abnormal   Collection Time: 11/19/17  1:49 PM  Result Value Ref Range   Opiates POSITIVE (A) NONE DETECTED   Cocaine POSITIVE (A) NONE DETECTED   Benzodiazepines POSITIVE (A) NONE DETECTED   Amphetamines POSITIVE (A) NONE DETECTED   Tetrahydrocannabinol POSITIVE (A) NONE DETECTED   Barbiturates NONE DETECTED NONE DETECTED    Comment: (NOTE) DRUG SCREEN FOR MEDICAL  PURPOSES ONLY.  IF CONFIRMATION IS NEEDED FOR ANY PURPOSE, NOTIFY LAB WITHIN 5 DAYS. LOWEST DETECTABLE LIMITS FOR URINE DRUG SCREEN Drug Class                     Cutoff (ng/mL) Amphetamine and metabolites    1000 Barbiturate and metabolites    200 Benzodiazepine                 032 Tricyclics and metabolites     300 Opiates and metabolites        300 Cocaine and metabolites        300 THC                            50 Performed at [facility], [place] [address] [place], [place] [address]   CBC     Status: Abnormal   Collection Time: 11/20/17  4:38 AM  Result Value Ref Range   WBC 19.4 (H) 4.0 - 10.5 K/uL   RBC 4.22 3.87 - 5.11 MIL/uL   Hemoglobin 12.3 12.0 - 15.0 g/dL   HCT 37.4 36.0 - 46.0 %   MCV 88.6 78.0 - 100.0 fL   MCH 29.1 26.0 - 34.0 pg   MCHC 32.9 30.0 - 36.0 g/dL   RDW 13.9 11.5 - 15.5 %   Platelets 265 150 - 400 K/uL    Comment: Performed at [facility], [place] [address]  [place], [place] [address]  Basic metabolic panel     Status: Abnormal   Collection Time: 11/20/17  4:38 AM  Result Value Ref Range   Sodium 136 135 - 145 mmol/L   Potassium 3.4 (L) 3.5 - 5.1 mmol/L   Chloride 100 (L) 101 - 111 mmol/L   CO2 26 22 - 32 mmol/L   Glucose, Bld 120 (H) 65 - 99 mg/dL   BUN 12 6 - 20 mg/dL   Creatinine, Ser 0.67 0.44 - 1.00 mg/dL   Calcium 8.0 (L) 8.9 - 10.3 mg/dL   GFR calc non Af Amer >60 >60 mL/min   GFR calc Af Amer >60 >60 mL/min    Comment: (NOTE) The eGFR has been calculated using the CKD EPI equation. This calculation has not been validated in all clinical situations. eGFR's persistently <60 mL/min signify possible Chronic Kidney Disease.    Anion gap 10 5 - 15    Comment: Performed at [facility], [place] [address], [place], [place] [address]    Dg Chest 2 View  Result Date: 11/18/2017 CLINICAL DATA:  Nausea and chills for 4 days. EXAM: CHEST - 2 VIEW COMPARISON:  None. FINDINGS: The heart size  and mediastinal contours are within normal limits. Both lungs are clear. The visualized skeletal structures are unremarkable. IMPRESSION: Negative.  No active cardiopulmonary disease. Electronically Signed   By: Earle Gell M.D.   On: 11/18/2017 19:27   Dg Ankle 2 Views Right  Result Date: 11/20/2017 CLINICAL DATA:  Ankle pain and swelling following injection EXAM: RIGHT ANKLE - 2 VIEW COMPARISON:  None. FINDINGS: Diffuse soft tissue swelling is noted about the ankle consistent with the given clinical history. No acute fracture or dislocation is noted. No bony erosive changes are seen. IMPRESSION: Diffuse soft tissue swelling without acute bony abnormality. Electronically Signed   By: Inez Catalina M.D.   On: 11/20/2017 09:46    Review of Systems  Constitutional: Positive for fever. Negative for weight loss.  HENT: Negative for ear discharge, ear pain, hearing loss and tinnitus.   Eyes: Negative for blurred vision, double vision, photophobia and pain.  Respiratory: Negative for cough, sputum production and shortness of breath.   Cardiovascular: Negative for chest pain.  Gastrointestinal: Positive for nausea. Negative for abdominal pain and vomiting.  Genitourinary: Negative for dysuria, flank pain, frequency and urgency.  Musculoskeletal: Positive for joint pain (Right foot/ankle). Negative for back pain, falls, myalgias and neck pain.  Neurological: Negative for dizziness, tingling, sensory change, focal weakness, loss of consciousness and headaches.  Endo/Heme/Allergies: Does not bruise/bleed easily.  Psychiatric/Behavioral: Negative for depression, memory loss and substance abuse. The patient is not nervous/anxious.    Blood pressure 111/71, pulse 90, temperature 99.2 F (37.3 C), temperature source Oral, resp. rate 18, height 5' 4" (1.626 m), weight 69 kg (152 lb 3.2 oz), SpO2 94 %. Physical Exam  Constitutional: She appears well-developed and well-nourished. No distress.  HENT:  Head:  Normocephalic and atraumatic.  Eyes: Conjunctivae are normal. Right eye exhibits no discharge. Left eye exhibits no discharge. No scleral icterus.  Neck: Normal range of motion.  Cardiovascular: Normal rate and regular rhythm.  Respiratory: Effort normal. No respiratory distress.  Musculoskeletal:  RLE No traumatic wounds or ecchymosis  Foot erythematous to proximal to ankle, swollen, exquisite TTP  No knee effusion  Knee stable to varus/ valgus and anterior/posterior stress  Sens DPN, SPN, TN intact  Motor EHL, ext, flex, evers 3/5 (likely 2/2 pain)  4+ edema,  no palpable pulses 2/2 swelling, bulla located laterally midfoot  Neurological: She is alert.  Skin: Skin is warm and dry. She is not diaphoretic.  Psychiatric: She has a normal mood and affect. Her behavior is normal.    Assessment/Plan: Right ankle/foot infection -- Will request transfer to [facility], would suggest adding GN coverage as pt has worsened over last couple of days. If still clinically indicated will plan on I&D in OR on Friday by Dr. Sharol Given. Will get MRI to better elucidate involved structures.    Lisette Abu, PA-C Orthopedic Surgery [phone] 11/20/2017, 11:27 AM

## 2017-11-21 ENCOUNTER — Other Ambulatory Visit (INDEPENDENT_AMBULATORY_CARE_PROVIDER_SITE_OTHER): Payer: Self-pay | Admitting: Orthopedic Surgery

## 2017-11-21 DIAGNOSIS — L03115 Cellulitis of right lower limb: Secondary | ICD-10-CM

## 2017-11-21 DIAGNOSIS — M65071 Abscess of tendon sheath, right ankle and foot: Secondary | ICD-10-CM

## 2017-11-21 LAB — SURGICAL PCR SCREEN
MRSA, PCR: NEGATIVE
Staphylococcus aureus: POSITIVE — AB

## 2017-11-21 MED ORDER — MUPIROCIN 2 % EX OINT
1.0000 "application " | TOPICAL_OINTMENT | Freq: Two times a day (BID) | CUTANEOUS | Status: AC
  Administered 2017-11-21 – 2017-11-25 (×9): 1 via NASAL
  Filled 2017-11-21 (×4): qty 22

## 2017-11-21 MED ORDER — PIPERACILLIN-TAZOBACTAM 3.375 G IVPB
3.3750 g | Freq: Three times a day (TID) | INTRAVENOUS | Status: DC
  Administered 2017-11-21 – 2017-11-23 (×4): 3.375 g via INTRAVENOUS
  Filled 2017-11-21 (×8): qty 50

## 2017-11-21 MED ORDER — CHLORHEXIDINE GLUCONATE CLOTH 2 % EX PADS
6.0000 | MEDICATED_PAD | Freq: Every day | CUTANEOUS | Status: AC
  Administered 2017-11-21 – 2017-11-25 (×4): 6 via TOPICAL

## 2017-11-21 NOTE — Consult Note (Signed)
ORTHOPAEDIC CONSULTATION  REQUESTING PHYSICIAN: Cathren Harshai, Ripudeep K, MD  Chief Complaint: Right ankle pain swelling and cellulitis.  HPI: Stacey BelfastStacy Langenbach is a 37 y.o. female who presents with right ankle pain swelling and cellulitis.  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  Patient states she has had over a 1 week history of pain redness swelling and blistering over the lateral aspect of the right ankle.  Past Medical History:  Diagnosis Date  . Hx of degenerative disc disease   . Substance abuse ([facility])    History reviewed. No pertinent surgical history. Social History   Socioeconomic History  . Marital status: Single    Spouse name: Not on file  . Number of children: Not on file  . Years of education: Not on file  . Highest education level: Not on file  Occupational History  . Not on file  Social Needs  . Financial resource strain: Not on file  . Food insecurity:    Worry: Not on file    Inability: Not on file  . Transportation needs:    Medical: Not on file    Non-medical: Not on file  Tobacco Use  . Smoking status: Current Every Day Smoker    Types: Cigarettes  Substance and Sexual Activity  . Alcohol use: Not Currently  . Drug use: Yes  . Sexual activity: Not on file  Lifestyle  . Physical activity:    Days per week: Not on file    Minutes per session: Not on file  . Stress: Not on file  Relationships  . Social connections:    Talks on phone: Not on file    Gets together: Not on file    Attends religious service: Not on file    Active member of club or organization: Not on file    Attends meetings of clubs or organizations: Not on file    Relationship status: Not on file  Other Topics Concern  . Not on file  Social History Narrative  . Not on file   History reviewed. No pertinent family history. - negative except otherwise stated in the family history section Allergies  Allergen Reactions  . Doxycycline     blisters  . Phenytoin Sodium  Extended Other (See Comments)    Seizure   Prior to Admission medications   Medication Sig Start Date End Date Taking? Authorizing Provider  acetaminophen (TYLENOL) 500 MG tablet Take 1,000 mg by mouth every 4 (four) hours as needed for moderate pain.   Yes [provider]  ALPRAZolam Prudy Feeler(XANAX) 1 MG tablet Take 1 mg by mouth 2 (two) times daily as needed for anxiety.  09/27/17  Yes [provider]  FLUoxetine (PROZAC) 10 MG capsule Take 20 mg by mouth daily. 10/16/17  Yes [provider]  gabapentin (NEURONTIN) 600 MG tablet Take 600 mg by mouth 4 (four) times daily. 09/20/17  Yes [provider]  ibuprofen (ADVIL,MOTRIN) 200 MG tablet Take 800 mg by mouth every 4 (four) hours as needed for moderate pain.   Yes [provider]  methocarbamol (ROBAXIN) 500 MG tablet Take 1,500-2,000 mg by mouth 4 (four) times daily. 10/16/17  Yes [provider]  QUEtiapine (SEROQUEL) 25 MG tablet Take 25-50 mg by mouth at bedtime. 10/16/17  Yes [provider]  traZODone (DESYREL) 150 MG tablet Take 150 mg by mouth at bedtime. 09/20/17  Yes [provider]   Dg Ankle 2 Views Right  Result Date: 11/20/2017 CLINICAL DATA:  Ankle pain and swelling following injection EXAM: RIGHT ANKLE - 2 VIEW COMPARISON:  None. FINDINGS: Diffuse soft tissue swelling is noted about the ankle consistent with the given clinical history. No acute fracture or dislocation is noted. No bony erosive changes are seen. IMPRESSION: Diffuse soft tissue swelling without acute bony abnormality. Electronically Signed   By: Alcide Clever M.D.   On: 11/20/2017 09:46   Ct Soft Tissue Neck W Contrast  Result Date: 11/20/2017 CLINICAL DATA:  History of drug use. Inflamed area at the base of the neck on the right. EXAM: CT NECK WITH CONTRAST TECHNIQUE: Multidetector CT imaging of the neck was performed using the standard protocol following the bolus administration of intravenous contrast.  CONTRAST:  75mL OMNIPAQUE IOHEXOL 300 MG/ML  SOLN COMPARISON:  None. FINDINGS: Pharynx and larynx: No mucosal or submucosal lesion. Salivary glands: Parotid and submandibular glands are normal. Thyroid: Normal Lymph nodes: No enlarged or low-density nodes on either side of the neck. Vascular: Arterial and venous structures are patent. Limited intracranial: Normal Visualized orbits: Not included Mastoids and visualized paranasal sinuses: Clear Skeleton: Negative Upper chest: Negative Other: There is an abscess in the right supraclavicular fossa region measuring 3 x 5 cm, with extension into the sternocleidomastoid muscle and a series of intramuscular abscesses. The highest abscess at the C3 level is small, measuring only 5 mm in diameter. Middle abscess at the C4-5 level shows a diameter of 1.3 cm. The lower abscess in communication with the supraclavicular abscess measures about 2 cm in diameter. IMPRESSION: Supraclavicular fossa abscess on the right measuring about 3 x 5 cm in diameter. Intramuscular extension of abscess into the right sternocleidomastoid muscle with several loculations. Electronically Signed   By: Paulina Fusi M.D.   On: 11/20/2017 13:13   Mr Ankle Right W Wo Contrast  Result Date: 11/20/2017 CLINICAL DATA:  IV drug user with fever and worsening foot/ankle pain, erythema and swelling. EXAM: MRI OF THE RIGHT ANKLE WITHOUT AND WITH CONTRAST TECHNIQUE: Multiplanar, multisequence MR imaging of the ankle was performed before and after the administration of intravenous contrast. CONTRAST:  15mL MULTIHANCE GADOBENATE DIMEGLUMINE 529 MG/ML IV SOLN COMPARISON:  None. FINDINGS: TENDONS Peroneal: Intact peroneus longus and peroneus brevis tendons. Posteromedial: Intact tibialis posterior, flexor hallucis longus and flexor digitorum longus tendons. Anterior: Intact tibialis anterior, extensor hallucis longus and extensor digitorum longus tendons. Achilles: Intact. Plantar Fascia: Intact. LIGAMENTS  Lateral: Intact. Medial: Intact. CARTILAGE Ankle Joint: No joint effusion or chondral defect. Subtalar Joints/Sinus Tarsi: No joint effusion or chondral defect. Bones: No marrow signal abnormality. No fracture or dislocation. Other: Cellulitis with subcutaneous soft tissue edema of the included leg, ankle and midfoot. Crescentic subcutaneous fluid collections with subtle enhancement compatible with evolving soft tissue abscesses along the dorsal and lateral aspect of the ankle and foot, the largest collection approximately 4.5 x 1.4 x 1.5 cm overlying the dorsal lateral aspect of the midfoot. IMPRESSION: IMPRESSION 1. Subcutaneous loculated enhancing soft tissue fluid collections along the dorsal and lateral aspect of the ankle and foot compatible with evolving soft tissue abscesses. Surrounding cellulitis of the ankle and foot without osteomyelitis. 2. Intact tendons and ligaments crossing the ankle joint. Electronically Signed   By: Tollie Eth M.D.   On: 11/20/2017 19:56   - pertinent xrays, CT, MRI studies were reviewed and independently interpreted  Positive ROS: All other systems have been reviewed and were otherwise negative with the exception of those mentioned in the HPI and as above.  Physical Exam: General:  Alert, no acute distress Psychiatric: Patient is competent for consent with normal mood and affect Lymphatic: No axillary or cervical lymphadenopathy Cardiovascular: No pedal edema Respiratory: No cyanosis, no use of accessory musculature GI: No organomegaly, abdomen is soft and non-tender  Skin: Patient has worsening cellulitis in the right ankle.  There are blisters fluid-filled collections.  Images:  @ENCIMAGES @   Neurologic: Patient does not have protective sensation bilateral lower extremities.   MUSCULOSKELETAL:  Patient has a strong dorsalis pedis pulse on the left right dorsalis pedis pulses nonpalpable due to swelling and pain.  Review of the MRI scan does show a fluid  collection subcutaneous consistent with an abscess there is no signs of any involvement of the tendons or bone.  Assessment: Assessment: Right ankle lateral abscess.  Plan: Plan: We will plan for irrigation and debridement of the abscess possible placement of an installation wound VAC with possible need for repeat surgery next week.  We will plan for surgery tomorrow Friday morning.  Thank you for the consult and the opportunity to see Ms. Eligah East, MD [facility] [phone] 6:42 AM

## 2017-11-21 NOTE — H&P (View-Only) (Signed)
ORTHOPAEDIC CONSULTATION  REQUESTING PHYSICIAN: Cathren Harshai, Ripudeep K, MD  Chief Complaint: Right ankle pain swelling and cellulitis.  HPI: Stacey Martinez is a 37 y.o. female who presents with right ankle pain swelling and cellulitis.  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  Patient states she has had over a 1 week history of pain redness swelling and blistering over the lateral aspect of the right ankle.  Past Medical History:  Diagnosis Date  . Hx of degenerative disc disease   . Substance abuse (HCC)    History reviewed. No pertinent surgical history. Social History   Socioeconomic History  . Marital status: Single    Spouse name: Not on file  . Number of children: Not on file  . Years of education: Not on file  . Highest education level: Not on file  Occupational History  . Not on file  Social Needs  . Financial resource strain: Not on file  . Food insecurity:    Worry: Not on file    Inability: Not on file  . Transportation needs:    Medical: Not on file    Non-medical: Not on file  Tobacco Use  . Smoking status: Current Every Day Smoker    Types: Cigarettes  Substance and Sexual Activity  . Alcohol use: Not Currently  . Drug use: Yes  . Sexual activity: Not on file  Lifestyle  . Physical activity:    Days per week: Not on file    Minutes per session: Not on file  . Stress: Not on file  Relationships  . Social connections:    Talks on phone: Not on file    Gets together: Not on file    Attends religious service: Not on file    Active member of club or organization: Not on file    Attends meetings of clubs or organizations: Not on file    Relationship status: Not on file  Other Topics Concern  . Not on file  Social History Narrative  . Not on file   History reviewed. No pertinent family history. - negative except otherwise stated in the family history section Allergies  Allergen Reactions  . Doxycycline     blisters  . Phenytoin Sodium  Extended Other (See Comments)    Seizure   Prior to Admission medications   Medication Sig Start Date End Date Taking? Authorizing Provider  acetaminophen (TYLENOL) 500 MG tablet Take 1,000 mg by mouth every 4 (four) hours as needed for moderate pain.   Yes [provider]  ALPRAZolam Prudy Feeler(XANAX) 1 MG tablet Take 1 mg by mouth 2 (two) times daily as needed for anxiety.  09/27/17  Yes [provider]  FLUoxetine (PROZAC) 10 MG capsule Take 20 mg by mouth daily. 10/16/17  Yes [provider]  gabapentin (NEURONTIN) 600 MG tablet Take 600 mg by mouth 4 (four) times daily. 09/20/17  Yes [provider]  ibuprofen (ADVIL,MOTRIN) 200 MG tablet Take 800 mg by mouth every 4 (four) hours as needed for moderate pain.   Yes [provider]  methocarbamol (ROBAXIN) 500 MG tablet Take 1,500-2,000 mg by mouth 4 (four) times daily. 10/16/17  Yes [provider]  QUEtiapine (SEROQUEL) 25 MG tablet Take 25-50 mg by mouth at bedtime. 10/16/17  Yes [provider]  traZODone (DESYREL) 150 MG tablet Take 150 mg by mouth at bedtime. 09/20/17  Yes [provider]   Dg Ankle 2 Views Right  Result Date: 11/20/2017 CLINICAL DATA:  Ankle pain and swelling following injection EXAM: RIGHT ANKLE - 2 VIEW COMPARISON:  None. FINDINGS: Diffuse soft tissue swelling is noted about the ankle consistent with the given clinical history. No acute fracture or dislocation is noted. No bony erosive changes are seen. IMPRESSION: Diffuse soft tissue swelling without acute bony abnormality. Electronically Signed   By: Alcide Clever M.D.   On: 11/20/2017 09:46   Ct Soft Tissue Neck W Contrast  Result Date: 11/20/2017 CLINICAL DATA:  History of drug use. Inflamed area at the base of the neck on the right. EXAM: CT NECK WITH CONTRAST TECHNIQUE: Multidetector CT imaging of the neck was performed using the standard protocol following the bolus administration of intravenous contrast.  CONTRAST:  75mL OMNIPAQUE IOHEXOL 300 MG/ML  SOLN COMPARISON:  None. FINDINGS: Pharynx and larynx: No mucosal or submucosal lesion. Salivary glands: Parotid and submandibular glands are normal. Thyroid: Normal Lymph nodes: No enlarged or low-density nodes on either side of the neck. Vascular: Arterial and venous structures are patent. Limited intracranial: Normal Visualized orbits: Not included Mastoids and visualized paranasal sinuses: Clear Skeleton: Negative Upper chest: Negative Other: There is an abscess in the right supraclavicular fossa region measuring 3 x 5 cm, with extension into the sternocleidomastoid muscle and a series of intramuscular abscesses. The highest abscess at the C3 level is small, measuring only 5 mm in diameter. Middle abscess at the C4-5 level shows a diameter of 1.3 cm. The lower abscess in communication with the supraclavicular abscess measures about 2 cm in diameter. IMPRESSION: Supraclavicular fossa abscess on the right measuring about 3 x 5 cm in diameter. Intramuscular extension of abscess into the right sternocleidomastoid muscle with several loculations. Electronically Signed   By: Paulina Fusi M.D.   On: 11/20/2017 13:13   Mr Ankle Right W Wo Contrast  Result Date: 11/20/2017 CLINICAL DATA:  IV drug user with fever and worsening foot/ankle pain, erythema and swelling. EXAM: MRI OF THE RIGHT ANKLE WITHOUT AND WITH CONTRAST TECHNIQUE: Multiplanar, multisequence MR imaging of the ankle was performed before and after the administration of intravenous contrast. CONTRAST:  15mL MULTIHANCE GADOBENATE DIMEGLUMINE 529 MG/ML IV SOLN COMPARISON:  None. FINDINGS: TENDONS Peroneal: Intact peroneus longus and peroneus brevis tendons. Posteromedial: Intact tibialis posterior, flexor hallucis longus and flexor digitorum longus tendons. Anterior: Intact tibialis anterior, extensor hallucis longus and extensor digitorum longus tendons. Achilles: Intact. Plantar Fascia: Intact. LIGAMENTS  Lateral: Intact. Medial: Intact. CARTILAGE Ankle Joint: No joint effusion or chondral defect. Subtalar Joints/Sinus Tarsi: No joint effusion or chondral defect. Bones: No marrow signal abnormality. No fracture or dislocation. Other: Cellulitis with subcutaneous soft tissue edema of the included leg, ankle and midfoot. Crescentic subcutaneous fluid collections with subtle enhancement compatible with evolving soft tissue abscesses along the dorsal and lateral aspect of the ankle and foot, the largest collection approximately 4.5 x 1.4 x 1.5 cm overlying the dorsal lateral aspect of the midfoot. IMPRESSION: IMPRESSION 1. Subcutaneous loculated enhancing soft tissue fluid collections along the dorsal and lateral aspect of the ankle and foot compatible with evolving soft tissue abscesses. Surrounding cellulitis of the ankle and foot without osteomyelitis. 2. Intact tendons and ligaments crossing the ankle joint. Electronically Signed   By: Tollie Eth M.D.   On: 11/20/2017 19:56   - pertinent xrays, CT, MRI studies were reviewed and independently interpreted  Positive ROS: All other systems have been reviewed and were otherwise negative with the exception of those mentioned in the HPI and as above.  Physical Exam: General:  Alert, no acute distress Psychiatric: Patient is competent for consent with normal mood and affect Lymphatic: No axillary or cervical lymphadenopathy Cardiovascular: No pedal edema Respiratory: No cyanosis, no use of accessory musculature GI: No organomegaly, abdomen is soft and non-tender  Skin: Patient has worsening cellulitis in the right ankle.  There are blisters fluid-filled collections.  Images:  @ENCIMAGES @   Neurologic: Patient does not have protective sensation bilateral lower extremities.   MUSCULOSKELETAL:  Patient has a strong dorsalis pedis pulse on the left right dorsalis pedis pulses nonpalpable due to swelling and pain.  Review of the MRI scan does show a fluid  collection subcutaneous consistent with an abscess there is no signs of any involvement of the tendons or bone.  Assessment: Assessment: Right ankle lateral abscess.  Plan: Plan: We will plan for irrigation and debridement of the abscess possible placement of an installation wound VAC with possible need for repeat surgery next week.  We will plan for surgery tomorrow Friday morning.  Thank you for the consult and the opportunity to see Ms. Eligah East, MD [facility] [phone] 6:42 AM

## 2017-11-21 NOTE — Progress Notes (Signed)
[facility]                                                                              Patient Demographics  Stacey Martinez, is a 37 y.o. female, DOB - July 30, 1981, AVW:[id number]  Admit date - 11/18/2017   Admitting Physician Clydie Braun, MD  Outpatient Primary MD for the patient is Arlie Solomons, MD  Outpatient specialists:   LOS - 3  days   Medical records reviewed and are as summarized below:    Chief Complaint  Patient presents with  . Cellulitis  . Emesis       Brief summary   Patient is a 37 y.o. female with history of IVDA (mostly cocaine, intermittently heroin)-presented with fever, worsening right foot/ankle pain, and erythema/swelling in a area in the lower part of her right neck. She was thought to have soft tissue infection/cellulitis-and started on vancomycin, however in spite of being on antimicrobial therapy for 48 hours, patient continued to have persistent fever, and worsening swelling of the right foot highly concerning for underlying septic arthritis and abscess in the lower part of right neck area.   Patient was transferred to [facility] for I&D, seen by Dr Lajoyce Corners, planned on 4/12.    Assessment & Plan    Principal Problem:   Right ankle/foot cellulitis with concern for septic arthritis in the setting of IV drug use  - Continue IV vancomycin and Zosyn - Orthopedics consulted, planned for incision and debridement in 01/12, NPO after MN - Blood cultures negative so far  - 2-D echo on 4/9 showed EF of 55-60%, no obvious vegetations    Active Problems:  right upper chest wall, supraclavicular area abscess - CT soft tissue neck showed supraclavicular fossa abscess on the right measuring 3x 5 cm. Intramuscular extension of abscess into the right sternocleidomastoid muscle with several loculations - Gen. surgery consulted, planning I&D of the neck area at the same time with orthopedics     IVDU (intravenous drug user) - Urine drug  screen positive for amphetamines and benzodiazepines, opiates, cocaine, THC  - Continue pain control, with that she will not be provided narcotics on discharge - Patient counseled strongly for recreational drugs cessation     Anxiety and depression - Continue  Xanax as needed, fluoxetine and Seroquel   Code Status: full  DVT Prophylaxis:  Lovenox  Family Communication: Discussed in detail with the patient, all imaging results, lab results explained to the patient    Disposition Plan: OR in am   Time Spent in minutes 35 minutes  Procedures:    Consultants:   Surgery  ortho  Antimicrobials:   IV  vanc   IV zosyn    Medications  Scheduled Meds: . Chlorhexidine Gluconate Cloth  6 each Topical Daily  . enoxaparin (LOVENOX) injection  40 mg Subcutaneous Q24H  . feeding supplement (ENSURE ENLIVE)  237 mL Oral BID BM  . FLUoxetine  20 mg Oral Daily  . gabapentin  600 mg Oral QID  . mouth rinse  15 mL Mouth Rinse BID  . multivitamin with minerals  1 tablet Oral Daily  . mupirocin  ointment  1 application Nasal BID  . mupirocin ointment   Topical Daily  . nicotine  21 mg Transdermal Daily  . polyethylene glycol  17 g Oral Daily  . QUEtiapine  25-50 mg Oral QHS  . senna-docusate  2 tablet Oral QHS  . traZODone  150 mg Oral QHS   Continuous Infusions: . sodium chloride 75 mL/hr at 11/20/17 2234  . piperacillin-tazobactam (ZOSYN)  IV 3.375 g (11/21/17 1018)  . vancomycin 1,000 mg (11/21/17 0448)   PRN Meds:.acetaminophen **OR** acetaminophen, albuterol, ALPRAZolam, methocarbamol, morphine injection, ondansetron **OR** ondansetron (ZOFRAN) IV, oxyCODONE   Antibiotics   Anti-infectives (From admission, onward)   Start     Dose/Rate Route Frequency Ordered Stop   11/20/17 1600  vancomycin (VANCOCIN) IVPB 1000 mg/200 mL premix     1,000 mg 200 mL/hr over 60 Minutes Intravenous Every 12 hours 11/20/17 0755     11/20/17 1400  piperacillin-tazobactam (ZOSYN) IVPB 3.375 g       3.375 g 12.5 mL/hr over 240 Minutes Intravenous Every 8 hours 11/20/17 1237     11/19/17 0400  vancomycin (VANCOCIN) IVPB 750 mg/150 ml premix  Status:  Discontinued     750 mg 150 mL/hr over 60 Minutes Intravenous Every 12 hours 11/18/17 2207 11/20/17 0755   11/18/17 1800  vancomycin (VANCOCIN) IVPB 1000 mg/200 mL premix     1,000 mg 200 mL/hr over 60 Minutes Intravenous  Once 11/18/17 1758 11/18/17 2021        Subjective:   Stacey Martinez was seen and examined today. Pain in the neck and right foot area controlled with medications,  awaiting surgery. Patient denies dizziness, chest pain, shortness of breath, abdominal pain, N/V/D/C, new weakness, numbess, tingling. No acute events overnight.  fever of 100.29F    Objective:   Vitals:   11/20/17 1309 11/20/17 2126 11/20/17 2231 11/21/17 0438  BP: 119/66 127/68 109/61 106/69  Pulse: 90 95 98 91  Resp: 14 20 14    Temp: 99.8 F (37.7 C) (!) 100.4 F (38 C)  98.9 F (37.2 C)  TempSrc: Oral Oral  Oral  SpO2: 95% 96% 97% 96%  Weight:      Height:        Intake/Output Summary (Last 24 hours) at 11/21/2017 1118 Last data filed at 11/21/2017 0650 Gross per 24 hour  Intake 1711.25 ml  Output 500 ml  Net 1211.25 ml     Wt Readings from Last 3 Encounters:  11/18/17 69 kg (152 lb 3.2 oz)     Exam  General: Alert and oriented x 3, NAD  Eyes: PERRLA, EOMI, Anicteric Sclera,  HEENT:  Atraumatic, normocephalic, indurated area in the lower right neck, erythematous, tender   Cardiovascular: S1 S2 auscultated, no rubs, murmurs or gallops. Regular rate and rhythm.  Respiratory: Clear to auscultation bilaterally, no wheezing, rales or rhonchi  Gastrointestinal: Soft, nontender, nondistended, + bowel sounds  Ext: right foot tender, erythematous or swollen  Neuro: AAOx3, Cr N's II- XII. Strength 5/5 upper and lower extremities bilaterally  Musculoskeletal: No digital cyanosis, clubbing  Skin: see extremity exam   Psych:  Normal affect and demeanor, alert and oriented x3    Data Reviewed:  I have personally reviewed following labs and imaging studies  Micro Results Recent Results (from the past 240 hour(s))  Blood culture (routine x 2)     Status: None (Preliminary result)   Collection Time: 11/18/17  7:04 PM  Result Value Ref Range Status   Specimen Description  Final    BLOOD LEFT ARM Performed at [facility], [address], [place], [place] [address]    Special Requests   Final    BOTTLES DRAWN AEROBIC AND ANAEROBIC Blood Culture adequate volume Performed at [facility], [address] [place], [place] [address]    Culture   Final    NO GROWTH 2 DAYS Performed at [facility], [address] [place], [place] [address]    Report Status PENDING  Incomplete  Blood culture (routine x 2)     Status: None (Preliminary result)   Collection Time: 11/18/17  7:04 PM  Result Value Ref Range Status   Specimen Description   Final    BLOOD LEFT FOREARM Performed at [facility], [address] [place], [place] [address]    Special Requests   Final    BOTTLES DRAWN AEROBIC AND ANAEROBIC Blood Culture adequate volume Performed at [facility], [address] [place], [place] [address]    Culture   Final    NO GROWTH 2 DAYS Performed at [facility], [address] [place], [place] [address]    Report Status PENDING  Incomplete  Surgical pcr screen     Status: Abnormal   Collection Time: 11/20/17 11:20 PM  Result Value Ref Range Status   MRSA, PCR NEGATIVE NEGATIVE Final   Staphylococcus aureus POSITIVE (A) NEGATIVE Final    Comment: (NOTE) The Xpert SA Assay (FDA approved for NASAL specimens in patients 54 years of age and older), is one component of a comprehensive surveillance program. It is not intended to diagnose infection nor to guide or monitor treatment. Performed at [facility],  [address] [place], [place] [address]     Radiology Reports Dg Chest 2 View  Result Date: 11/18/2017 CLINICAL DATA:  Nausea and chills for 4 days. EXAM: CHEST - 2 VIEW COMPARISON:  None. FINDINGS: The heart size and mediastinal contours are within normal limits. Both lungs are clear. The visualized skeletal structures are unremarkable. IMPRESSION: Negative.  No active cardiopulmonary disease. Electronically Signed   By: Myles Rosenthal M.D.   On: 11/18/2017 19:27   Dg Ankle 2 Views Right  Result Date: 11/20/2017 CLINICAL DATA:  Ankle pain and swelling following injection EXAM: RIGHT ANKLE - 2 VIEW COMPARISON:  None. FINDINGS: Diffuse soft tissue swelling is noted about the ankle consistent with the given clinical history. No acute fracture or dislocation is noted. No bony erosive changes are seen. IMPRESSION: Diffuse soft tissue swelling without acute bony abnormality. Electronically Signed   By: Alcide Clever M.D.   On: 11/20/2017 09:46   Ct Soft Tissue Neck W Contrast  Result Date: 11/20/2017 CLINICAL DATA:  History of drug use. Inflamed area at the base of the neck on the right. EXAM: CT NECK WITH CONTRAST TECHNIQUE: Multidetector CT imaging of the neck was performed using the standard protocol following the bolus administration of intravenous contrast. CONTRAST:  75mL OMNIPAQUE IOHEXOL 300 MG/ML  SOLN COMPARISON:  None. FINDINGS: Pharynx and larynx: No mucosal or submucosal lesion. Salivary glands: Parotid and submandibular glands are normal. Thyroid: Normal Lymph nodes: No enlarged or low-density nodes on either side of the neck. Vascular: Arterial and venous structures are patent. Limited intracranial: Normal Visualized orbits: Not included Mastoids and visualized paranasal sinuses: Clear Skeleton: Negative Upper chest: Negative Other: There is an abscess in the right supraclavicular fossa region measuring 3 x 5 cm, with  extension into the sternocleidomastoid muscle and a series of intramuscular  abscesses. The highest abscess at the C3 level is small, measuring only 5 mm in diameter. Middle abscess at the C4-5 level shows a diameter of 1.3 cm. The lower abscess in communication with the supraclavicular abscess measures about 2 cm in diameter. IMPRESSION: Supraclavicular fossa abscess on the right measuring about 3 x 5 cm in diameter. Intramuscular extension of abscess into the right sternocleidomastoid muscle with several loculations. Electronically Signed   By: Paulina FusiMark  Shogry M.D.   On: 11/20/2017 13:13   Mr Ankle Right W Wo Contrast  Result Date: 11/20/2017 CLINICAL DATA:  IV drug user with fever and worsening foot/ankle pain, erythema and swelling. EXAM: MRI OF THE RIGHT ANKLE WITHOUT AND WITH CONTRAST TECHNIQUE: Multiplanar, multisequence MR imaging of the ankle was performed before and after the administration of intravenous contrast. CONTRAST:  15mL MULTIHANCE GADOBENATE DIMEGLUMINE 529 MG/ML IV SOLN COMPARISON:  None. FINDINGS: TENDONS Peroneal: Intact peroneus longus and peroneus brevis tendons. Posteromedial: Intact tibialis posterior, flexor hallucis longus and flexor digitorum longus tendons. Anterior: Intact tibialis anterior, extensor hallucis longus and extensor digitorum longus tendons. Achilles: Intact. Plantar Fascia: Intact. LIGAMENTS Lateral: Intact. Medial: Intact. CARTILAGE Ankle Joint: No joint effusion or chondral defect. Subtalar Joints/Sinus Tarsi: No joint effusion or chondral defect. Bones: No marrow signal abnormality. No fracture or dislocation. Other: Cellulitis with subcutaneous soft tissue edema of the included leg, ankle and midfoot. Crescentic subcutaneous fluid collections with subtle enhancement compatible with evolving soft tissue abscesses along the dorsal and lateral aspect of the ankle and foot, the largest collection approximately 4.5 x 1.4 x 1.5 cm overlying the dorsal lateral aspect of the midfoot. IMPRESSION: IMPRESSION 1. Subcutaneous loculated enhancing soft  tissue fluid collections along the dorsal and lateral aspect of the ankle and foot compatible with evolving soft tissue abscesses. Surrounding cellulitis of the ankle and foot without osteomyelitis. 2. Intact tendons and ligaments crossing the ankle joint. Electronically Signed   By: Tollie Ethavid  Kwon M.D.   On: 11/20/2017 19:56    Lab Data:  CBC: Recent Labs  Lab 11/18/17 1749 11/18/17 1922 11/19/17 0840 11/20/17 0438  WBC 22.5* 22.8* 26.1* 19.4*  NEUTROABS  --  20.5*  --   --   HGB 14.5 14.7 13.3 12.3  HCT 43.5 44.2 40.6 37.4  MCV 88.4 87.2 88.8 88.6  PLT 206 214 243 265   Basic Metabolic Panel: Recent Labs  Lab 11/18/17 1749 11/19/17 0840 11/20/17 0438  NA 138 134* 136  K 3.5 3.7 3.4*  CL 100* 98* 100*  CO2 23 25 26   GLUCOSE 131* 120* 120*  BUN 20 21* 12  CREATININE 0.73 0.66 0.67  CALCIUM 8.6* 8.4* 8.0*   GFR: Estimated Creatinine Clearance: 90.9 mL/min (by C-G formula based on SCr of 0.67 mg/dL). Liver Function Tests: Recent Labs  Lab 11/18/17 1749  AST 22  ALT 18  ALKPHOS 84  BILITOT 0.6  PROT 7.4  ALBUMIN 3.0*   Recent Labs  Lab 11/18/17 1749  LIPASE 20   No results for input(s): AMMONIA in the last 168 hours. Coagulation Profile: No results for input(s): INR, PROTIME in the last 168 hours. Cardiac Enzymes: No results for input(s): CKTOTAL, CKMB, CKMBINDEX, TROPONINI in the last 168 hours. BNP (last 3 results) No results for input(s): PROBNP in the last 8760 hours. HbA1C: No results for input(s): HGBA1C in the last 72 hours. CBG: No results for input(s): GLUCAP in the last 168 hours. Lipid Profile: No  results for input(s): CHOL, HDL, LDLCALC, TRIG, CHOLHDL, LDLDIRECT in the last 72 hours. Thyroid Function Tests: No results for input(s): TSH, T4TOTAL, FREET4, T3FREE, THYROIDAB in the last 72 hours. Anemia Panel: No results for input(s): VITAMINB12, FOLATE, FERRITIN, TIBC, IRON, RETICCTPCT in the last 72 hours. Urine analysis:    Component Value  Date/Time   COLORURINE AMBER (A) 11/19/2017 1349   APPEARANCEUR HAZY (A) 11/19/2017 1349   LABSPEC 1.032 (H) 11/19/2017 1349   PHURINE 6.0 11/19/2017 1349   GLUCOSEU NEGATIVE 11/19/2017 1349   HGBUR NEGATIVE 11/19/2017 1349   BILIRUBINUR SMALL (A) 11/19/2017 1349   KETONESUR 5 (A) 11/19/2017 1349   PROTEINUR 100 (A) 11/19/2017 1349   NITRITE NEGATIVE 11/19/2017 1349   LEUKOCYTESUR NEGATIVE 11/19/2017 1349     Ripudeep Rai M.D. [facility] 11/21/2017, 11:18 AM  Pager: [phone] Between 7am to 7pm - call Pager - [phone]  After 7pm go to [email or web address] - password [email or web address]  Call night coverage person covering after 7pm

## 2017-11-21 NOTE — Plan of Care (Signed)
  Problem: Health Behavior/Discharge Planning: Goal: Ability to manage health-related needs will improve Outcome: Progressing   Problem: Safety: Goal: Ability to remain free from injury will improve Outcome: Progressing   

## 2017-11-21 NOTE — Progress Notes (Signed)
Pt raised red area on upper right chest wall has become more inflamed and she is stating that the pain is spreading to her throat as well as her right foot the blister has gotten bigger is looks as if it is about to pop. Paged MD waiting call back.

## 2017-11-22 ENCOUNTER — Encounter (HOSPITAL_COMMUNITY): Payer: Self-pay | Admitting: *Deleted

## 2017-11-22 ENCOUNTER — Inpatient Hospital Stay (HOSPITAL_COMMUNITY): Payer: Medicaid Other | Admitting: Certified Registered"

## 2017-11-22 ENCOUNTER — Encounter (HOSPITAL_COMMUNITY)
Admission: EM | Disposition: A | Discharge status: Home / Self Care | Payer: Self-pay | Source: Home / Self Care | Attending: Family Medicine

## 2017-11-22 DIAGNOSIS — M65071 Abscess of tendon sheath, right ankle and foot: Secondary | ICD-10-CM

## 2017-11-22 HISTORY — PX: IRRIGATION AND DEBRIDEMENT ABSCESS: SHX5252

## 2017-11-22 HISTORY — PX: INCISION AND DRAINAGE ABSCESS: SHX5864

## 2017-11-22 HISTORY — PX: I & D EXTREMITY: SHX5045

## 2017-11-22 LAB — CBC
HCT: 35.3 % — ABNORMAL LOW (ref 36.0–46.0)
Hemoglobin: 11.3 g/dL — ABNORMAL LOW (ref 12.0–15.0)
MCH: 29 pg (ref 26.0–34.0)
MCHC: 32 g/dL (ref 30.0–36.0)
MCV: 90.5 fL (ref 78.0–100.0)
PLATELETS: 334 10*3/uL (ref 150–400)
RBC: 3.9 MIL/uL (ref 3.87–5.11)
RDW: 14.5 % (ref 11.5–15.5)
WBC: 14.4 10*3/uL — ABNORMAL HIGH (ref 4.0–10.5)

## 2017-11-22 SURGERY — IRRIGATION AND DEBRIDEMENT EXTREMITY
Anesthesia: General | Site: Neck | Laterality: Right

## 2017-11-22 MED ORDER — DEXAMETHASONE SODIUM PHOSPHATE 10 MG/ML IJ SOLN
INTRAMUSCULAR | Status: DC | PRN
  Administered 2017-11-22: 10 mg via INTRAVENOUS

## 2017-11-22 MED ORDER — CHLORHEXIDINE GLUCONATE 4 % EX LIQD: 60.0000 mL | Freq: Once | CUTANEOUS | Status: DC

## 2017-11-22 MED ORDER — HYDROMORPHONE HCL 1 MG/ML IJ SOLN
INTRAMUSCULAR | Status: DC | PRN
  Administered 2017-11-22 (×2): 0.5 mg via INTRAVENOUS

## 2017-11-22 MED ORDER — BISACODYL 10 MG RE SUPP: 10.0000 mg | Freq: Every day | RECTAL | Status: DC | PRN

## 2017-11-22 MED ORDER — FENTANYL CITRATE (PF) 250 MCG/5ML IJ SOLN
INTRAMUSCULAR | Status: AC
  Filled 2017-11-22: qty 5

## 2017-11-22 MED ORDER — MIDAZOLAM HCL 5 MG/5ML IJ SOLN
INTRAMUSCULAR | Status: DC | PRN
  Administered 2017-11-22: 2 mg via INTRAVENOUS

## 2017-11-22 MED ORDER — FENTANYL CITRATE (PF) 100 MCG/2ML IJ SOLN
INTRAMUSCULAR | Status: DC | PRN
  Administered 2017-11-22 (×3): 100 ug via INTRAVENOUS
  Administered 2017-11-22: 50 ug via INTRAVENOUS

## 2017-11-22 MED ORDER — DEXMEDETOMIDINE HCL 200 MCG/2ML IV SOLN
INTRAVENOUS | Status: DC | PRN
  Administered 2017-11-22 (×3): 20 ug via INTRAVENOUS

## 2017-11-22 MED ORDER — POLYETHYLENE GLYCOL 3350 17 G PO PACK: 17.0000 g | PACK | Freq: Every day | ORAL | Status: DC | PRN

## 2017-11-22 MED ORDER — CEFAZOLIN SODIUM-DEXTROSE 2-4 GM/100ML-% IV SOLN
2.0000 g | INTRAVENOUS | Status: AC
  Administered 2017-11-22: 2 g via INTRAVENOUS

## 2017-11-22 MED ORDER — PROPOFOL 500 MG/50ML IV EMUL: INTRAVENOUS | Status: DC | PRN

## 2017-11-22 MED ORDER — MAGNESIUM CITRATE PO SOLN
1.0000 | Freq: Once | ORAL | Status: DC | PRN
  Filled 2017-11-22 (×2): qty 296

## 2017-11-22 MED ORDER — HYDROMORPHONE HCL 1 MG/ML IJ SOLN
INTRAMUSCULAR | Status: AC
  Filled 2017-11-22: qty 0.5

## 2017-11-22 MED ORDER — PROMETHAZINE HCL 25 MG/ML IJ SOLN: 6.2500 mg | INTRAMUSCULAR | Status: DC | PRN

## 2017-11-22 MED ORDER — HYDROMORPHONE HCL 1 MG/ML IJ SOLN
INTRAMUSCULAR | Status: AC
  Administered 2017-11-22: 0.5 mg via INTRAVENOUS
  Filled 2017-11-22: qty 1

## 2017-11-22 MED ORDER — 0.9 % SODIUM CHLORIDE (POUR BTL) OPTIME
TOPICAL | Status: DC | PRN
  Administered 2017-11-22: 1000 mL

## 2017-11-22 MED ORDER — SUCCINYLCHOLINE CHLORIDE 20 MG/ML IJ SOLN
INTRAMUSCULAR | Status: DC | PRN
  Administered 2017-11-22: 100 mg via INTRAVENOUS

## 2017-11-22 MED ORDER — HYDROMORPHONE HCL 1 MG/ML IJ SOLN
0.2500 mg | INTRAMUSCULAR | Status: DC | PRN
  Administered 2017-11-22 (×4): 0.5 mg via INTRAVENOUS

## 2017-11-22 MED ORDER — ONDANSETRON HCL 4 MG PO TABS: 4.0000 mg | ORAL_TABLET | Freq: Four times a day (QID) | ORAL | Status: DC | PRN

## 2017-11-22 MED ORDER — LIDOCAINE HCL (CARDIAC) 20 MG/ML IV SOLN
INTRAVENOUS | Status: DC | PRN
  Administered 2017-11-22: 100 mg via INTRATRACHEAL

## 2017-11-22 MED ORDER — ONDANSETRON HCL 4 MG/2ML IJ SOLN: 4.0000 mg | Freq: Four times a day (QID) | INTRAMUSCULAR | Status: DC | PRN

## 2017-11-22 MED ORDER — LIDOCAINE 2% (20 MG/ML) 5 ML SYRINGE
INTRAMUSCULAR | Status: AC
  Filled 2017-11-22: qty 5

## 2017-11-22 MED ORDER — SENNA 8.6 MG PO TABS
1.0000 | ORAL_TABLET | Freq: Two times a day (BID) | ORAL | Status: DC
  Administered 2017-11-22 – 2017-12-02 (×17): 8.6 mg via ORAL
  Filled 2017-11-22 (×18): qty 1

## 2017-11-22 MED ORDER — SODIUM CHLORIDE 0.9 % IV SOLN
INTRAVENOUS | Status: DC
  Administered 2017-11-22: 17:00:00 via INTRAVENOUS

## 2017-11-22 MED ORDER — DEXMEDETOMIDINE HCL IN NACL 200 MCG/50ML IV SOLN
INTRAVENOUS | Status: AC
  Filled 2017-11-22: qty 50

## 2017-11-22 MED ORDER — MIDAZOLAM HCL 2 MG/2ML IJ SOLN
INTRAMUSCULAR | Status: AC
  Filled 2017-11-22: qty 2

## 2017-11-22 MED ORDER — PROPOFOL 10 MG/ML IV BOLUS
INTRAVENOUS | Status: AC
  Filled 2017-11-22: qty 20

## 2017-11-22 MED ORDER — LACTATED RINGERS IV SOLN
INTRAVENOUS | Status: DC
  Administered 2017-11-22: 10:00:00 via INTRAVENOUS

## 2017-11-22 MED ORDER — PROPOFOL 10 MG/ML IV BOLUS
INTRAVENOUS | Status: DC | PRN
  Administered 2017-11-22: 200 mg via INTRAVENOUS

## 2017-11-22 SURGICAL SUPPLY — 37 items
BLADE SURG 21 STRL SS (BLADE) ×5 IMPLANT
BNDG COHESIVE 6X5 TAN STRL LF (GAUZE/BANDAGES/DRESSINGS) ×2 IMPLANT
BNDG GAUZE ELAST 4 BULKY (GAUZE/BANDAGES/DRESSINGS) ×8 IMPLANT
CANISTER WOUND CARE 500ML ATS (WOUND CARE) ×2 IMPLANT
CASSETTE VERAFLO VERALINK (MISCELLANEOUS) ×2 IMPLANT
COVER SURGICAL LIGHT HANDLE (MISCELLANEOUS) ×10 IMPLANT
DRAPE INCISE IOBAN 66X45 STRL (DRAPES) ×2 IMPLANT
DRAPE U-SHAPE 47X51 STRL (DRAPES) ×5 IMPLANT
DRESSING VERAFLO CLEANSE CC (GAUZE/BANDAGES/DRESSINGS) IMPLANT
DRSG ADAPTIC 3X8 NADH LF (GAUZE/BANDAGES/DRESSINGS) ×3 IMPLANT
DRSG VERAFLO CLEANSE CC (GAUZE/BANDAGES/DRESSINGS) ×5
DURAPREP 26ML APPLICATOR (WOUND CARE) ×5 IMPLANT
ELECT REM PT RETURN 9FT ADLT (ELECTROSURGICAL)
ELECTRODE REM PT RTRN 9FT ADLT (ELECTROSURGICAL) IMPLANT
GAUZE PACKING IODOFORM 1/4X15 (GAUZE/BANDAGES/DRESSINGS) ×4 IMPLANT
GAUZE SPONGE 4X4 12PLY STRL (GAUZE/BANDAGES/DRESSINGS) ×5 IMPLANT
GLOVE BIOGEL PI IND STRL 9 (GLOVE) ×3 IMPLANT
GLOVE BIOGEL PI INDICATOR 9 (GLOVE) ×2
GLOVE SURG ORTHO 9.0 STRL STRW (GLOVE) ×5 IMPLANT
GOWN STRL REUS W/ TWL XL LVL3 (GOWN DISPOSABLE) ×6 IMPLANT
GOWN STRL REUS W/TWL XL LVL3 (GOWN DISPOSABLE) ×4
HANDPIECE INTERPULSE COAX TIP (DISPOSABLE)
KIT BASIN OR (CUSTOM PROCEDURE TRAY) ×5 IMPLANT
KIT TURNOVER KIT B (KITS) ×5 IMPLANT
MANIFOLD NEPTUNE II (INSTRUMENTS) ×5 IMPLANT
NS IRRIG 1000ML POUR BTL (IV SOLUTION) ×5 IMPLANT
PACK ORTHO EXTREMITY (CUSTOM PROCEDURE TRAY) ×5 IMPLANT
PAD ARMBOARD 7.5X6 YLW CONV (MISCELLANEOUS) ×10 IMPLANT
SET HNDPC FAN SPRY TIP SCT (DISPOSABLE) IMPLANT
STOCKINETTE IMPERVIOUS 9X36 MD (GAUZE/BANDAGES/DRESSINGS) IMPLANT
SWAB COLLECTION DEVICE MRSA (MISCELLANEOUS) ×5 IMPLANT
SWAB CULTURE ESWAB REG 1ML (MISCELLANEOUS) ×2 IMPLANT
TAPE CLOTH SURG 4X10 WHT LF (GAUZE/BANDAGES/DRESSINGS) ×2 IMPLANT
TOWEL OR 17X26 10 PK STRL BLUE (TOWEL DISPOSABLE) ×5 IMPLANT
TUBE CONNECTING 12'X1/4 (SUCTIONS) ×1
TUBE CONNECTING 12X1/4 (SUCTIONS) ×4 IMPLANT
YANKAUER SUCT BULB TIP NO VENT (SUCTIONS) ×5 IMPLANT

## 2017-11-22 NOTE — Progress Notes (Signed)
Patient ID: Stacey Martinez, female   DOB: 1980/08/18, 37 y.o.   MRN: [id number]       Subjective: Patient c/o neck and ankle pain.  Objective: Vital signs in last 24 hours: Temp:  [98.4 F (36.9 C)-99.5 F (37.5 C)] 98.4 F (36.9 C) (04/12 0509) Pulse Rate:  [80-85] 84 (04/12 0509) Resp:  [16-18] 18 (04/12 0509) BP: (113-118)/(58-62) 116/62 (04/12 0509) SpO2:  [96 %-98 %] 98 % (04/12 0509) Last BM Date: 11/18/17  Intake/Output from previous day: 04/11 0701 - 04/12 0700 In: 2257.5 [P.O.:720; I.V.:687.5; IV Piggyback:850] Out: -  Intake/Output this shift: No intake/output data recorded.  PE: Skin: large right neck abscess, fluctuant.  Erythema present Ext: significant right ankle cellulitis, edema, and vesicular formation  Lab Results:  Recent Labs    11/19/17 0840 11/20/17 0438  WBC 26.1* 19.4*  HGB 13.3 12.3  HCT 40.6 37.4  PLT 243 265   BMET Recent Labs    11/19/17 0840 11/20/17 0438  NA 134* 136  K 3.7 3.4*  CL 98* 100*  CO2 25 26  GLUCOSE 120* 120*  BUN 21* 12  CREATININE 0.66 0.67  CALCIUM 8.4* 8.0*   PT/INR No results for input(s): LABPROT, INR in the last 72 hours. CMP     Component Value Date/Time   NA 136 11/20/2017 0438   K 3.4 (L) 11/20/2017 0438   CL 100 (L) 11/20/2017 0438   CO2 26 11/20/2017 0438   GLUCOSE 120 (H) 11/20/2017 0438   BUN 12 11/20/2017 0438   CREATININE 0.67 11/20/2017 0438   CALCIUM 8.0 (L) 11/20/2017 0438   PROT 7.4 11/18/2017 1749   ALBUMIN 3.0 (L) 11/18/2017 1749   AST 22 11/18/2017 1749   ALT 18 11/18/2017 1749   ALKPHOS 84 11/18/2017 1749   BILITOT 0.6 11/18/2017 1749   GFRNONAA >60 11/20/2017 0438   GFRAA >60 11/20/2017 0438   Lipase     Component Value Date/Time   LIPASE 20 11/18/2017 1749       Studies/Results: Dg Ankle 2 Views Right  Result Date: 11/20/2017 CLINICAL DATA:  Ankle pain and swelling following injection EXAM: RIGHT ANKLE - 2 VIEW COMPARISON:  None. FINDINGS: Diffuse soft tissue  swelling is noted about the ankle consistent with the given clinical history. No acute fracture or dislocation is noted. No bony erosive changes are seen. IMPRESSION: Diffuse soft tissue swelling without acute bony abnormality. Electronically Signed   By: Alcide Clever M.D.   On: 11/20/2017 09:46   Ct Soft Tissue Neck W Contrast  Result Date: 11/20/2017 CLINICAL DATA:  History of drug use. Inflamed area at the base of the neck on the right. EXAM: CT NECK WITH CONTRAST TECHNIQUE: Multidetector CT imaging of the neck was performed using the standard protocol following the bolus administration of intravenous contrast. CONTRAST:  75mL OMNIPAQUE IOHEXOL 300 MG/ML  SOLN COMPARISON:  None. FINDINGS: Pharynx and larynx: No mucosal or submucosal lesion. Salivary glands: Parotid and submandibular glands are normal. Thyroid: Normal Lymph nodes: No enlarged or low-density nodes on either side of the neck. Vascular: Arterial and venous structures are patent. Limited intracranial: Normal Visualized orbits: Not included Mastoids and visualized paranasal sinuses: Clear Skeleton: Negative Upper chest: Negative Other: There is an abscess in the right supraclavicular fossa region measuring 3 x 5 cm, with extension into the sternocleidomastoid muscle and a series of intramuscular abscesses. The highest abscess at the C3 level is small, measuring only 5 mm in diameter. Middle abscess at the C4-5 level  shows a diameter of 1.3 cm. The lower abscess in communication with the supraclavicular abscess measures about 2 cm in diameter. IMPRESSION: Supraclavicular fossa abscess on the right measuring about 3 x 5 cm in diameter. Intramuscular extension of abscess into the right sternocleidomastoid muscle with several loculations. Electronically Signed   By: Paulina Fusi M.D.   On: 11/20/2017 13:13   Mr Ankle Right W Wo Contrast  Result Date: 11/20/2017 CLINICAL DATA:  IV drug user with fever and worsening foot/ankle pain, erythema and  swelling. EXAM: MRI OF THE RIGHT ANKLE WITHOUT AND WITH CONTRAST TECHNIQUE: Multiplanar, multisequence MR imaging of the ankle was performed before and after the administration of intravenous contrast. CONTRAST:  15mL MULTIHANCE GADOBENATE DIMEGLUMINE 529 MG/ML IV SOLN COMPARISON:  None. FINDINGS: TENDONS Peroneal: Intact peroneus longus and peroneus brevis tendons. Posteromedial: Intact tibialis posterior, flexor hallucis longus and flexor digitorum longus tendons. Anterior: Intact tibialis anterior, extensor hallucis longus and extensor digitorum longus tendons. Achilles: Intact. Plantar Fascia: Intact. LIGAMENTS Lateral: Intact. Medial: Intact. CARTILAGE Ankle Joint: No joint effusion or chondral defect. Subtalar Joints/Sinus Tarsi: No joint effusion or chondral defect. Bones: No marrow signal abnormality. No fracture or dislocation. Other: Cellulitis with subcutaneous soft tissue edema of the included leg, ankle and midfoot. Crescentic subcutaneous fluid collections with subtle enhancement compatible with evolving soft tissue abscesses along the dorsal and lateral aspect of the ankle and foot, the largest collection approximately 4.5 x 1.4 x 1.5 cm overlying the dorsal lateral aspect of the midfoot. IMPRESSION: IMPRESSION 1. Subcutaneous loculated enhancing soft tissue fluid collections along the dorsal and lateral aspect of the ankle and foot compatible with evolving soft tissue abscesses. Surrounding cellulitis of the ankle and foot without osteomyelitis. 2. Intact tendons and ligaments crossing the ankle joint. Electronically Signed   By: Tollie Eth M.D.   On: 11/20/2017 19:56    Anti-infectives: Anti-infectives (From admission, onward)   Start     Dose/Rate Route Frequency Ordered Stop   11/22/17 1000  ceFAZolin (ANCEF) IVPB 2g/100 mL premix     2 g 200 mL/hr over 30 Minutes Intravenous To Short Stay 11/22/17 0445 11/23/17 1000   11/21/17 1818  piperacillin-tazobactam (ZOSYN) IVPB 3.375 g      3.375 g 12.5 mL/hr over 240 Minutes Intravenous Every 8 hours 11/21/17 1205     11/20/17 1600  vancomycin (VANCOCIN) IVPB 1000 mg/200 mL premix     1,000 mg 200 mL/hr over 60 Minutes Intravenous Every 12 hours 11/20/17 0755     11/20/17 1400  piperacillin-tazobactam (ZOSYN) IVPB 3.375 g  Status:  Discontinued     3.375 g 12.5 mL/hr over 240 Minutes Intravenous Every 8 hours 11/20/17 1237 11/21/17 1205   11/19/17 0400  vancomycin (VANCOCIN) IVPB 750 mg/150 ml premix  Status:  Discontinued     750 mg 150 mL/hr over 60 Minutes Intravenous Every 12 hours 11/18/17 2207 11/20/17 0755   11/18/17 1800  vancomycin (VANCOCIN) IVPB 1000 mg/200 mL premix     1,000 mg 200 mL/hr over 60 Minutes Intravenous  Once 11/18/17 1758 11/18/17 2021       Assessment/Plan  1. Right neck abscess Plan to proceed to OR today for I&D while in there for her ankle surgery.   I have seen the patient already and marked her for her procedure today.  The procedure including expected outcome, risks and complications, including but not limited to bleeding, anesthesia complications such as MI, CVA, or death have been discussed.  She is agreeable to proceed.  FEN - NPO VTE - Lovenox ID - Vanc/Zosyn   LOS: 4 days    Letha CapeKelly E Corleone Biegler , [facility] 11/22/2017, 7:39 AM Pager: [phone]

## 2017-11-22 NOTE — Progress Notes (Signed)
Patient ID: Stacey Martinez, female   DOB: 1981/07/22, 37 y.o.   MRN: [id number]                                                                PROGRESS NOTE                                                                                                                                                                                                             Patient Demographics:    Stacey Martinez, is a 37 y.o. female, DOB - 1980-10-08, WUJ:[id number]  Admit date - 11/18/2017   Admitting Physician Clydie Braun, MD  Outpatient Primary MD for the patient is Arlie Solomons, MD  LOS - 4  Outpatient Specialists:  Chief Complaint  Patient presents with  . Cellulitis  . Emesis       Brief Narrative   37 y.o.femalewith history of IVDA (mostly cocaine, intermittently heroin)-presented with fever, worsening right foot/ankle pain, and erythema/swelling in a area in the lower part of her right neck. She was thought to have soft tissue infection/cellulitis-and started on vancomycin, however in spite of being on antimicrobial therapy for 48 hours, patient continued to have persistent fever, and worsening swelling of the right foot highly concerning for underlying septic arthritis and abscess in the lower part of right neck area.   Patient was transferred to [facility] for I&D, seen by Dr Lajoyce Corners, planned on 4/12.      Subjective:    Stacey Martinez today states that the redness of her right ankle was slightly worse yesterday.  Afebrile.  Pt is going to OR today.   No headache, No chest pain, No abdominal pain - No Nausea, No new weakness tingling or numbness, No Cough - SOB.    Assessment  & Plan :    Principal Problem:   Cellulitis Active Problems:   Leukocytosis   IVDU (intravenous drug user)   Anxiety and depression   Cellulitis of right ankle    Right ankle/foot cellulitis with concern for septic arthritis in the setting of IV drug use  - Continue IV vancomycin and Zosyn -  Orthopedics consulted, planned for incision and debridement in 4/12, NPO after MN - Blood cultures negative so far  - 2-D echo on 4/9 showed EF of 55-60%,  no obvious vegetations      right upper chest wall, supraclavicular area abscess - CT soft tissue neck showed supraclavicular fossa abscess on the right measuring 3x 5 cm. Intramuscular extension of abscess into the right sternocleidomastoid muscle with several loculations - Gen. surgery consulted, planning I&D of the neck area at the same time with orthopedics     IVDU (intravenous drug user) - Urine drug screen positive for amphetamines and benzodiazepines, opiates, cocaine, THC  - Continue pain control, with that she will not be provided narcotics on discharge - Patient counseled strongly for recreational drugs cessation     Anxiety and depression - Continue  Xanax as needed, fluoxetine and Seroquel   Code Status: full  DVT Prophylaxis:  Lovenox  Family Communication: Discussed in detail with the patient, all imaging results, lab results explained to the patient         Lab Results  Component Value Date   PLT 265 11/20/2017    Antibiotics  :  Vanco, zosyn 4/10=>  Anti-infectives (From admission, onward)   Start     Dose/Rate Route Frequency Ordered Stop   11/22/17 1000  ceFAZolin (ANCEF) IVPB 2g/100 mL premix     2 g 200 mL/hr over 30 Minutes Intravenous To Short Stay 11/22/17 0445 11/23/17 1000   11/21/17 1818  piperacillin-tazobactam (ZOSYN) IVPB 3.375 g     3.375 g 12.5 mL/hr over 240 Minutes Intravenous Every 8 hours 11/21/17 1205     11/20/17 1600  vancomycin (VANCOCIN) IVPB 1000 mg/200 mL premix     1,000 mg 200 mL/hr over 60 Minutes Intravenous Every 12 hours 11/20/17 0755     11/20/17 1400  piperacillin-tazobactam (ZOSYN) IVPB 3.375 g  Status:  Discontinued     3.375 g 12.5 mL/hr over 240 Minutes Intravenous Every 8 hours 11/20/17 1237 11/21/17 1205   11/19/17 0400  vancomycin (VANCOCIN) IVPB 750  mg/150 ml premix  Status:  Discontinued     750 mg 150 mL/hr over 60 Minutes Intravenous Every 12 hours 11/18/17 2207 11/20/17 0755   11/18/17 1800  vancomycin (VANCOCIN) IVPB 1000 mg/200 mL premix     1,000 mg 200 mL/hr over 60 Minutes Intravenous  Once 11/18/17 1758 11/18/17 2021        Objective:   Vitals:   11/21/17 0438 11/21/17 1512 11/22/17 0009 11/22/17 0509  BP: 106/69 (!) 118/58 113/61 116/62  Pulse: 91 85 80 84  Resp:   16 18  Temp: 98.9 F (37.2 C) 99.5 F (37.5 C) 98.4 F (36.9 C) 98.4 F (36.9 C)  TempSrc: Oral Oral Oral Oral  SpO2: 96% 96% 98% 98%  Weight:      Height:        Wt Readings from Last 3 Encounters:  11/18/17 69 kg (152 lb 3.2 oz)     Intake/Output Summary (Last 24 hours) at 11/22/2017 0629 Last data filed at 11/22/2017 0225 Gross per 24 hour  Intake 2857.5 ml  Output -  Net 2857.5 ml     Physical Exam  Awake Alert, Oriented X 3, No new F.N deficits, Normal affect [place].AT,PERRAL Supple Neck,No JVD, No cervical lymphadenopathy appriciated.  Symmetrical Chest wall movement, Good air movement bilaterally, CTAB RRR,No Gallops,Rubs or new Murmurs, No Parasternal Heave +ve B.Sounds, Abd Soft, No tenderness, No organomegaly appriciated, No rebound - guarding or rigidity. No Cyanosis, Clubbing or edema,  Redness over the right foot and ankle,  + swelling.   Redness over the right clavicular area, w slight associated swelling  as well.     Data Review:    CBC Recent Labs  Lab 11/18/17 1749 11/18/17 1922 11/19/17 0840 11/20/17 0438  WBC 22.5* 22.8* 26.1* 19.4*  HGB 14.5 14.7 13.3 12.3  HCT 43.5 44.2 40.6 37.4  PLT 206 214 243 265  MCV 88.4 87.2 88.8 88.6  MCH 29.5 29.0 29.1 29.1  MCHC 33.3 33.3 32.8 32.9  RDW 13.6 13.6 14.0 13.9  LYMPHSABS  --  1.4  --   --   MONOABS  --  0.9  --   --   EOSABS  --  0.0  --   --   BASOSABS  --  0.0  --   --     Chemistries  Recent Labs  Lab 11/18/17 1749 11/19/17 0840 11/20/17 0438  NA 138  134* 136  K 3.5 3.7 3.4*  CL 100* 98* 100*  CO2 23 25 26   GLUCOSE 131* 120* 120*  BUN 20 21* 12  CREATININE 0.73 0.66 0.67  CALCIUM 8.6* 8.4* 8.0*  AST 22  --   --   ALT 18  --   --   ALKPHOS 84  --   --   BILITOT 0.6  --   --    ------------------------------------------------------------------------------------------------------------------ No results for input(s): CHOL, HDL, LDLCALC, TRIG, CHOLHDL, LDLDIRECT in the last 72 hours.  No results found for: HGBA1C ------------------------------------------------------------------------------------------------------------------ No results for input(s): TSH, T4TOTAL, T3FREE, THYROIDAB in the last 72 hours.  Invalid input(s): FREET3 ------------------------------------------------------------------------------------------------------------------ No results for input(s): VITAMINB12, FOLATE, FERRITIN, TIBC, IRON, RETICCTPCT in the last 72 hours.  Coagulation profile No results for input(s): INR, PROTIME in the last 168 hours.  No results for input(s): DDIMER in the last 72 hours.  Cardiac Enzymes No results for input(s): CKMB, TROPONINI, MYOGLOBIN in the last 168 hours.  Invalid input(s): CK ------------------------------------------------------------------------------------------------------------------ No results found for: BNP  Inpatient Medications  Scheduled Meds: . chlorhexidine  60 mL Topical Once  . Chlorhexidine Gluconate Cloth  6 each Topical Daily  . enoxaparin (LOVENOX) injection  40 mg Subcutaneous Q24H  . feeding supplement (ENSURE ENLIVE)  237 mL Oral BID BM  . FLUoxetine  20 mg Oral Daily  . gabapentin  600 mg Oral QID  . mouth rinse  15 mL Mouth Rinse BID  . multivitamin with minerals  1 tablet Oral Daily  . mupirocin ointment  1 application Nasal BID  . mupirocin ointment   Topical Daily  . nicotine  21 mg Transdermal Daily  . polyethylene glycol  17 g Oral Daily  . QUEtiapine  25-50 mg Oral QHS  .  senna-docusate  2 tablet Oral QHS  . traZODone  150 mg Oral QHS   Continuous Infusions: . sodium chloride 75 mL/hr at 11/21/17 0700  .  ceFAZolin (ANCEF) IV    . piperacillin-tazobactam (ZOSYN)  IV 3.375 g (11/22/17 0225)  . vancomycin 1,000 mg (11/22/17 0611)   PRN Meds:.acetaminophen **OR** acetaminophen, albuterol, ALPRAZolam, methocarbamol, morphine injection, ondansetron **OR** ondansetron (ZOFRAN) IV, oxyCODONE  Micro Results Recent Results (from the past 240 hour(s))  Blood culture (routine x 2)     Status: None (Preliminary result)   Collection Time: 11/18/17  7:04 PM  Result Value Ref Range Status   Specimen Description   Final    BLOOD LEFT ARM Performed at [facility], [address] [place], [place] [address]    Special Requests   Final    BOTTLES DRAWN AEROBIC AND ANAEROBIC Blood Culture adequate volume Performed at  [facility], [address] [place], [place] [address]    Culture   Final    NO GROWTH 3 DAYS Performed at [facility], [address], [place], [place] [address]    Report Status PENDING  Incomplete  Blood culture (routine x 2)     Status: None (Preliminary result)   Collection Time: 11/18/17  7:04 PM  Result Value Ref Range Status   Specimen Description   Final    BLOOD LEFT FOREARM Performed at [facility], [address] [place], [place] [address]    Special Requests   Final    BOTTLES DRAWN AEROBIC AND ANAEROBIC Blood Culture adequate volume Performed at [facility], [address] [place], [place] [address]    Culture   Final    NO GROWTH 3 DAYS Performed at [facility], [address], [place], [place] [address]    Report Status PENDING  Incomplete  Surgical pcr screen     Status: Abnormal   Collection Time: 11/20/17 11:20 PM  Result Value Ref Range Status   MRSA, PCR NEGATIVE NEGATIVE Final   Staphylococcus aureus POSITIVE (A) NEGATIVE  Final    Comment: (NOTE) The Xpert SA Assay (FDA approved for NASAL specimens in patients 72 years of age and older), is one component of a comprehensive surveillance program. It is not intended to diagnose infection nor to guide or monitor treatment. Performed at [facility], [address], [place], [place] [address]     Radiology Reports Dg Chest 2 View  Result Date: 11/18/2017 CLINICAL DATA:  Nausea and chills for 4 days. EXAM: CHEST - 2 VIEW COMPARISON:  None. FINDINGS: The heart size and mediastinal contours are within normal limits. Both lungs are clear. The visualized skeletal structures are unremarkable. IMPRESSION: Negative.  No active cardiopulmonary disease. Electronically Signed   By: Myles Rosenthal M.D.   On: 11/18/2017 19:27   Dg Ankle 2 Views Right  Result Date: 11/20/2017 CLINICAL DATA:  Ankle pain and swelling following injection EXAM: RIGHT ANKLE - 2 VIEW COMPARISON:  None. FINDINGS: Diffuse soft tissue swelling is noted about the ankle consistent with the given clinical history. No acute fracture or dislocation is noted. No bony erosive changes are seen. IMPRESSION: Diffuse soft tissue swelling without acute bony abnormality. Electronically Signed   By: Alcide Clever M.D.   On: 11/20/2017 09:46   Ct Soft Tissue Neck W Contrast  Result Date: 11/20/2017 CLINICAL DATA:  History of drug use. Inflamed area at the base of the neck on the right. EXAM: CT NECK WITH CONTRAST TECHNIQUE: Multidetector CT imaging of the neck was performed using the standard protocol following the bolus administration of intravenous contrast. CONTRAST:  75mL OMNIPAQUE IOHEXOL 300 MG/ML  SOLN COMPARISON:  None. FINDINGS: Pharynx and larynx: No mucosal or submucosal lesion. Salivary glands: Parotid and submandibular glands are normal. Thyroid: Normal Lymph nodes: No enlarged or low-density nodes on either side of the neck. Vascular: Arterial and venous structures are patent. Limited intracranial: Normal  Visualized orbits: Not included Mastoids and visualized paranasal sinuses: Clear Skeleton: Negative Upper chest: Negative Other: There is an abscess in the right supraclavicular fossa region measuring 3 x 5 cm, with extension into the sternocleidomastoid muscle and a series of intramuscular abscesses. The highest abscess at the C3 level is small, measuring only 5 mm in diameter. Middle abscess at the C4-5 level shows a diameter of 1.3 cm. The lower abscess  in communication with the supraclavicular abscess measures about 2 cm in diameter. IMPRESSION: Supraclavicular fossa abscess on the right measuring about 3 x 5 cm in diameter. Intramuscular extension of abscess into the right sternocleidomastoid muscle with several loculations. Electronically Signed   By: Paulina Fusi M.D.   On: 11/20/2017 13:13   Mr Ankle Right W Wo Contrast  Result Date: 11/20/2017 CLINICAL DATA:  IV drug user with fever and worsening foot/ankle pain, erythema and swelling. EXAM: MRI OF THE RIGHT ANKLE WITHOUT AND WITH CONTRAST TECHNIQUE: Multiplanar, multisequence MR imaging of the ankle was performed before and after the administration of intravenous contrast. CONTRAST:  15mL MULTIHANCE GADOBENATE DIMEGLUMINE 529 MG/ML IV SOLN COMPARISON:  None. FINDINGS: TENDONS Peroneal: Intact peroneus longus and peroneus brevis tendons. Posteromedial: Intact tibialis posterior, flexor hallucis longus and flexor digitorum longus tendons. Anterior: Intact tibialis anterior, extensor hallucis longus and extensor digitorum longus tendons. Achilles: Intact. Plantar Fascia: Intact. LIGAMENTS Lateral: Intact. Medial: Intact. CARTILAGE Ankle Joint: No joint effusion or chondral defect. Subtalar Joints/Sinus Tarsi: No joint effusion or chondral defect. Bones: No marrow signal abnormality. No fracture or dislocation. Other: Cellulitis with subcutaneous soft tissue edema of the included leg, ankle and midfoot. Crescentic subcutaneous fluid collections with subtle  enhancement compatible with evolving soft tissue abscesses along the dorsal and lateral aspect of the ankle and foot, the largest collection approximately 4.5 x 1.4 x 1.5 cm overlying the dorsal lateral aspect of the midfoot. IMPRESSION: IMPRESSION 1. Subcutaneous loculated enhancing soft tissue fluid collections along the dorsal and lateral aspect of the ankle and foot compatible with evolving soft tissue abscesses. Surrounding cellulitis of the ankle and foot without osteomyelitis. 2. Intact tendons and ligaments crossing the ankle joint. Electronically Signed   By: Tollie Eth M.D.   On: 11/20/2017 19:56    Time Spent in minutes  30   Pearson Grippe M.D on 11/22/2017 at 6:29 AM  Between 7am to 7pm - Pager - [phone]   After 7pm go to [email or web address] - password [email or web address]  [facility] -  Office  [phone]

## 2017-11-22 NOTE — Op Note (Signed)
11/22/2017  12:19 PM  PATIENT:  Stacey Martinez    PRE-OPERATIVE DIAGNOSIS:  Abscess Right Ankle and Right Neck  POST-OPERATIVE DIAGNOSIS:  Same  PROCEDURE:  RIGHT ANKLE DEBRIDEMENT AND PLACEMENT OF an installation wOUND VAC  SURGEON:  Nadara MustardMarcus V Iesha Summerhill, MD  PHYSICIAN ASSISTANT:None ANESTHESIA:   General  PREOPERATIVE INDICATIONS:  [name] is a  37 y.o. female with a diagnosis of Abscess Right Ankle and Right Neck who failed conservative measures and elected for surgical management.    The risks benefits and alternatives were discussed with the patient preoperatively including but not limited to the risks of infection, bleeding, nerve injury, cardiopulmonary complications, the need for revision surgery, among others, and the patient was willing to proceed.  OPERATIVE IMPLANTS: Cleanse choice dressing right foot  @ENCIMAGES @  OPERATIVE FINDINGS: Large abscess that extended over the entire dorsum of the foot and extended proximally along the posterior aspect of the ankle did not involve the tendon sheath.  OPERATIVE PROCEDURE: Patient was brought the operating room and underwent a general anesthetic.  After adequate levels of anesthesia were obtained patient's right lower extremity and right neck were  prepped using DuraPrep draped into a sterile field.  A timeout was called.  General surgery with Dr. Derrell Lollingamirez was in charge of the neck abscess.  Of note patient had a small abscess on the left forearm and after sterile prepping a 15 blade knife was used to make a small puncture wound the abscess was decompressed patient had approximately 10 cc of purulent drainage from the forearm abscess this was left open and a sterile dressing was applied.  Attention was focused to the sterile field of the right ankle.  A posterior lateral incision was made this curved down to the lateral aspect of the foot.  The incision was 15 cm in length.  There is a large abscess this was collected in a specimen cup  and sent for cultures.  The abscess extended through the entire dorsum of the foot.  The wound was irrigated with normal saline necrotic tissue including skin and soft tissue and fascia was excised with a rondure your.  After sharp excision the wound was further irrigated with normal saline.  A cleanse choice dressing was packed into the wound to pack open the dorsal foot wound.  This was then covered with the solid foam on top of the reticulated foam.  Ioban dressing was applied the skin was prepped using benzoin.  This had a good suction fit this was set for 16 cc of installation 10 minutes dwell time 2 hours of suction.   DISCHARGE PLANNING:  Antibiotic duration: Continue IV antibiotics deep cultures are obtained.  Weightbearing: Weightbearing as tolerated.  Pain medication: Continue current pain medication.  Dressing care/ Wound VAC: Wound VAC will be changed at follow-up surgery on Wednesday.  Ambulatory devices: Crutches or walker as needed.  Discharge to: Plan to return to the operating room on Wednesday for repeat irrigation debridement and wound closure.  Follow-up: In the office 1 week post operative.

## 2017-11-22 NOTE — Plan of Care (Signed)
  Problem: Health Behavior/Discharge Planning: Goal: Ability to manage health-related needs will improve Outcome: Progressing   Problem: Clinical Measurements: Goal: Ability to maintain clinical measurements within normal limits will improve Outcome: Progressing Goal: Will remain free from infection Outcome: Progressing Goal: Diagnostic test results will improve Outcome: Progressing Goal: Respiratory complications will improve Outcome: Progressing Goal: Cardiovascular complication will be avoided Outcome: Progressing   Problem: Activity: Goal: Risk for activity intolerance will decrease Outcome: Progressing   Problem: Nutrition: Goal: Adequate nutrition will be maintained Outcome: Progressing   Problem: Coping: Goal: Level of anxiety will decrease Outcome: Progressing   Problem: Elimination: Goal: Will not experience complications related to bowel motility Outcome: Progressing Goal: Will not experience complications related to urinary retention Outcome: Progressing   Problem: Pain Managment: Goal: General experience of comfort will improve Outcome: Progressing   Problem: Safety: Goal: Ability to remain free from injury will improve Outcome: Progressing   Problem: Skin Integrity: Goal: Risk for impaired skin integrity will decrease Outcome: Progressing   Problem: Clinical Measurements: Goal: Ability to avoid or minimize complications of infection will improve Outcome: Progressing   Problem: Skin Integrity: Goal: Skin integrity will improve Outcome: Progressing   

## 2017-11-22 NOTE — Interval H&P Note (Signed)
History and Physical Interval Note:  11/22/2017 6:40 AM  Stacey BelfastStacy Twining  has presented today for surgery, with the diagnosis of Abscess Right Ankle  The various methods of treatment have been discussed with the patient and family. After consideration of risks, benefits and other options for treatment, the patient has consented to  Procedure(s): RIGHT ANKLE DEBRIDEMENT AND PLACEMENT OF WOUND VAC (Right) as a surgical intervention .  The patient's history has been reviewed, patient examined, no change in status, stable for surgery.  I have reviewed the patient's chart and labs.  Questions were answered to the patient's satisfaction.     Nadara MustardMarcus V Duda

## 2017-11-22 NOTE — Anesthesia Procedure Notes (Signed)
Procedure Name: Intubation Date/Time: 11/22/2017 11:33 AM Performed by: Marny Lowensteinapozzi, Syrina Wake W, CRNA Pre-anesthesia Checklist: Patient identified, Emergency Drugs available, Suction available, Patient being monitored and Timeout performed Patient Re-evaluated:Patient Re-evaluated prior to induction Oxygen Delivery Method: Circle system utilized Preoxygenation: Pre-oxygenation with 100% oxygen Induction Type: IV induction Ventilation: Mask ventilation without difficulty Laryngoscope Size: Miller and 2 Grade View: Grade I Tube type: Oral Number of attempts: 1 Placement Confirmation: ETT inserted through vocal cords under direct vision,  positive ETCO2,  CO2 detector and breath sounds checked- equal and bilateral Secured at: 21 cm Tube secured with: Tape Dental Injury: Teeth and Oropharynx as per pre-operative assessment

## 2017-11-22 NOTE — Anesthesia Preprocedure Evaluation (Addendum)
Anesthesia Evaluation  Patient identified by MRN, date of birth, ID band Patient awake    Reviewed: Allergy & Precautions, NPO status , Patient's Chart, lab work & pertinent test results  Airway Mallampati: III  TM Distance: >3 FB Neck ROM: Full    Dental  (+) Edentulous Upper   Pulmonary Current Smoker,    Pulmonary exam normal breath sounds clear to auscultation       Cardiovascular negative cardio ROS Normal cardiovascular exam Rhythm:Regular Rate:Normal  ECG: SR, rate 84  ECHO: LV EF: 55% - 60% LVEF 55-60%, mild LVH, normal wall motion, aortic sclerosis with increased valvular velocity, trivial MR, normal atrial size, normal IVC. There was no obvious vegetation.     Neuro/Psych PSYCHIATRIC DISORDERS Anxiety negative neurological ROS     GI/Hepatic negative GI ROS, (+)     substance abuse  ,   Endo/Other  negative endocrine ROS  Renal/GU negative Renal ROS     Musculoskeletal negative musculoskeletal ROS (+)   Abdominal   Peds  Hematology  (+) anemia ,   Anesthesia Other Findings Abscess Right Ankle  Reproductive/Obstetrics                            Anesthesia Physical Anesthesia Plan  ASA: III  Anesthesia Plan: General   Post-op Pain Management:    Induction: Intravenous  PONV Risk Score and Plan: 2 and Ondansetron, Dexamethasone, Midazolam and Treatment may vary due to age or medical condition  Airway Management Planned: Oral ETT  Additional Equipment:   Intra-op Plan:   Post-operative Plan: Extubation in OR  Informed Consent: I have reviewed the patients History and Physical, chart, labs and discussed the procedure including the risks, benefits and alternatives for the proposed anesthesia with the patient or authorized representative who has indicated his/her understanding and acceptance.   Dental advisory given  Plan Discussed with: CRNA  Anesthesia Plan  Comments:       Anesthesia Quick Evaluation

## 2017-11-22 NOTE — Op Note (Signed)
11/22/2017  12:00 PM  PATIENT:  Stacey BelfastStacy Wiehe  37 y.o. female  PRE-OPERATIVE DIAGNOSIS:  Abscess Right Neck  POST-OPERATIVE DIAGNOSIS:  Abscess Right Neck  PROCEDURE: Incision and drainage ABSCESS Neck  SURGEON:  Surgeon(s) and Role:    Axel Filler* Stephan Draughn, MD - Primary  PHYSICIAN ASSISTANT: Barnetta ChapelKelly Osborne, PA-C    ANESTHESIA:   general  EBL:  5 mL   BLOOD ADMINISTERED:none  DRAINS: Quarter-inch iodoform gauze x1.5 bottles  :OCAL MEDICATIONS USED:  NONE  SPECIMEN:  Source of Specimen: Right neck abscess  DISPOSITION OF SPECIMEN: Microbiology  COUNTS:  YES  TOURNIQUET:  * No tourniquets in log *  DICTATION: .Dragon Dictation Details of procedure: After the patient was consented she was taken back to the operating room and placed in the supine position with bilateral SCDs in place.  She underwent general endotracheal intubation.  Patient prepped and draped in standard fashion.  A timeout was called and all facts were verified.  At this time a incision was made over the area of the right neck abscess.  Large amount of purulence was expressed.  There is approximately 100 cc of purulence from the abscess cavity.  Cultures were taken.  The area was irrigated out with sterile saline.  All loculations were broken up bluntly.  The area was checked for hemostasis which was good.  1/4 inch iodoform gauze was then packed into the wound.  It took approximately 1.5 bottles of the iodoform gauze to completely pack the wound.  The individual strands were tied together.  The incision site was dressed with 4 x 4's and tape.  The patient taught the procedure well was taken to the recovery in stable condition.  PLAN OF CARE: Admit to inpatient   PATIENT DISPOSITION:  PACU - hemodynamically stable.   Delay start of Pharmacological VTE agent (>24hrs) due to surgical blood loss or risk of bleeding: no

## 2017-11-22 NOTE — Transfer of Care (Signed)
Immediate Anesthesia Transfer of Care Note  Patient: Stacey Martinez  Procedure(s) Performed: RIGHT ANKLE DEBRIDEMENT AND PLACEMENT OF WOUND VAC  Drainege of Abcess left arm (Right ) INCISION AND DRAINAGE ABSCESS NECK ABCESS (Right Neck) IRRIGATION AND DEBRIDEMENT ABSCESS Neck  Patient Location: PACU  Anesthesia Type:General  Level of Consciousness: awake, alert  and oriented  Airway & Oxygen Therapy: Patient Spontanous Breathing and Patient connected to face mask oxygen  Post-op Assessment: Report given to RN and Post -op Vital signs reviewed and stable  Post vital signs: Reviewed and stable  Last Vitals:  Vitals Value Taken Time  BP 98/62 11/22/2017 12:23 PM  Temp    Pulse 70 11/22/2017 12:25 PM  Resp 11 11/22/2017 12:25 PM  SpO2 99 % 11/22/2017 12:25 PM  Vitals shown include unvalidated device data.  Last Pain:  Vitals:   11/22/17 0645  TempSrc:   PainSc: 8       Patients Stated Pain Goal: 3 (11/21/17 0950)  Complications: No apparent anesthesia complications

## 2017-11-23 LAB — COMPREHENSIVE METABOLIC PANEL
ALT: 26 U/L (ref 14–54)
AST: 23 U/L (ref 15–41)
Albumin: 1.8 g/dL — ABNORMAL LOW (ref 3.5–5.0)
Alkaline Phosphatase: 90 U/L (ref 38–126)
Anion gap: 9 (ref 5–15)
BILIRUBIN TOTAL: 0.2 mg/dL — AB (ref 0.3–1.2)
BUN: 11 mg/dL (ref 6–20)
CO2: 27 mmol/L (ref 22–32)
Calcium: 8.1 mg/dL — ABNORMAL LOW (ref 8.9–10.3)
Chloride: 103 mmol/L (ref 101–111)
Creatinine, Ser: 0.57 mg/dL (ref 0.44–1.00)
GFR calc Af Amer: >60 mL/min (ref >60)
GLUCOSE: 106 mg/dL — AB (ref 65–99)
POTASSIUM: 3.9 mmol/L (ref 3.5–5.1)
Sodium: 139 mmol/L (ref 135–145)
TOTAL PROTEIN: 5.9 g/dL — AB (ref 6.5–8.1)

## 2017-11-23 LAB — CBC
HEMATOCRIT: 37 % (ref 36.0–46.0)
HEMOGLOBIN: 12 g/dL (ref 12.0–15.0)
MCH: 29 pg (ref 26.0–34.0)
MCHC: 32.4 g/dL (ref 30.0–36.0)
MCV: 89.4 fL (ref 78.0–100.0)
Platelets: 423 10*3/uL — ABNORMAL HIGH (ref 150–400)
RBC: 4.14 MIL/uL (ref 3.87–5.11)
RDW: 13.8 % (ref 11.5–15.5)
WBC: 14 10*3/uL — AB (ref 4.0–10.5)

## 2017-11-23 LAB — CULTURE, BLOOD (ROUTINE X 2)
CULTURE: NO GROWTH
Culture: NO GROWTH
SPECIAL REQUESTS: ADEQUATE
SPECIAL REQUESTS: ADEQUATE

## 2017-11-23 LAB — VANCOMYCIN, TROUGH: VANCOMYCIN TR: 14 ug/mL — AB (ref 15–20)

## 2017-11-23 MED ORDER — PIPERACILLIN-TAZOBACTAM 3.375 G IVPB
3.3750 g | Freq: Three times a day (TID) | INTRAVENOUS | Status: DC
  Administered 2017-11-23 – 2017-11-24 (×3): 3.375 g via INTRAVENOUS
  Filled 2017-11-23 (×4): qty 50

## 2017-11-23 MED ORDER — SODIUM CHLORIDE 0.9% FLUSH: 10.0000 mL | INTRAVENOUS | Status: DC | PRN

## 2017-11-23 MED ORDER — MORPHINE SULFATE (PF) 2 MG/ML IV SOLN
2.0000 mg | Freq: Once | INTRAVENOUS | Status: AC
  Administered 2017-11-23: 2 mg via INTRAVENOUS
  Filled 2017-11-23: qty 1

## 2017-11-23 NOTE — Progress Notes (Signed)
Patient ID: Stacey BelfastStacy Martinez, female   DOB: 02/07/1981, 37 y.o.   MRN: [id number] Installation wound VAC working well.  There is now some wrinkling of the skin in her foot.  Plan to continue installation wound VAC and repeat irrigation and debridement on Wednesday with possible wound closure.

## 2017-11-23 NOTE — Progress Notes (Signed)
MD placed order for midline.  Patient on list for midline with iv team.  Once placed will have Vanc and Zosyn rescheduled with pharmacy.

## 2017-11-23 NOTE — Progress Notes (Signed)
Pharmacy Antibiotic Note  Stacey BelfastStacy Martinez is a 37 y.o. female with PMH significant for IV cocaine use admitted on 11/18/2017 with cellulitis.  Pharmacy has been consulted for Vancomycin and zosyn dosing. Echo neg for vegetations.   WBC at 14, Scr stable (now 0.57). Plan for wound VAC and repeat irrigation/debridement on Wednesday with possible wound closure. Erythema is improving.  Vanc trough returned at 14, was appropriately drawn. Appropriate trough for indication, likely may have a slight trend up. Continue with current dosing.  Plan: Continue vancomycin to 1 gm IV q12 Zosyn 3.375g IV Q8hrs Monitor renal function, cultures, clinical course.  Vancomycin levels if needed  Height: 5\' 4"  (162.6 cm) Weight: 152 lb (68.9 kg) IBW/kg (Calculated) : 54.7  Temp (24hrs), Avg:98.1 F (36.7 C), Min:97.8 F (36.6 C), Max:98.3 F (36.8 C)  Recent Labs  Lab 11/18/17 1749 11/18/17 1904 11/18/17 1922 11/19/17 0840 11/20/17 0438 11/22/17 0629 11/23/17 0523 11/23/17 1544  WBC 22.5*  --  22.8* 26.1* 19.4* 14.4* 14.0*  --   CREATININE 0.73  --   --  0.66 0.67  --  0.57  --   LATICACIDVEN  --  1.48  --   --   --   --   --   --   VANCOTROUGH  --   --   --   --   --   --   --  14*    Estimated Creatinine Clearance: 90.9 mL/min (by C-G formula based on SCr of 0.57 mg/dL).    Allergies  Allergen Reactions  . Phenytoin Sodium Extended Other (See Comments)    Seizure Fd&c Yellow #6-na Benzoate-phenytoin  . Yellow Dye Other (See Comments)    Seizures Fd&c Yellow #6-na Benzoate-phenytoin  . Doxycycline Rash    blisters Fixed drug eruption December 2018  Antimicrobials this admission: 4/8 >> Vancomycin >>  4/10 >>zosyn >>  Dose adjustments this admission: 4/10 750 q12>>1 gm q12 VT 4/13 vancomycin level was 14 (cont. Dose)   Microbiology results: 4/8 BCx: ngtd 4/8: MRSA PCR + HIV>neg RPR>neg Hep>neg  Thank you for allowing pharmacy to be a part of this patient's care.   Stacey MuAustin Martinez  Stacey Martinez PharmD PGY1 Pharmacy Practice Resident 11/23/2017 4:46 PM

## 2017-11-23 NOTE — Progress Notes (Signed)
PROGRESS NOTE    Stacey Martinez  KGY:[id number] DOB: 03-29-1981 DOA: 11/18/2017 PCP: Chapman Moss, MD   Brief Narrative:  Brief Narrative   37 y.o.femalewith history of IVDA (mostly cocaine, intermittently heroin)-presented with fever, worsening right foot/ankle pain, and erythema/swelling in a area in the lower part of her right neck. She was thought to have soft tissue infection/cellulitis-and started on vancomycin,however in spite of being on antimicrobial therapy for 48 hours,patient continuedto have persistent fever, and worsening swelling of the right foot highly concerning for underlying septic arthritis andabscess in the lower part of right neck area.  Patient was transferred to [facility] I&D, seen by Dr Sharol Given and it was done on 4/12 along with I and D of Neck Abscess by Dr. Rosendo Gros. She is POD 1 and complaining of pain.   Assessment & Plan:   Principal Problem:   Cellulitis Active Problems:   Leukocytosis   IVDU (intravenous drug user)   Anxiety and depression   Cellulitis of right ankle   Abscess of tendon sheath, right ankle and foot  Right Ankle/Foot cellulitis with concern for septic arthritis in the setting of IV drug use -Continue IV Vancomycin and Zosyn for now -Afebrile at 98.3 -Orthopedics consulted and did Incision and Debridement on 4/12 -WBC went from 26.1-> 14.0 -ESR was 82 -Blood Cultures showed No Growth at 5 Days -Transthoracic ECHO on 4/9 showed EF of 55-60%, no obvious vegetations -Right Foot Abscess Gram Stain showed Abundant WBC, Predominantly PMN with Moderate Gram Positive Cocci in Pairs and in Chains -Cx Re-incubated for better growth  -Of not MRSA PCR Negative but did grow Staphylococcus Aures  -Pain Control with 2 mg IV Morphine q4hprn Severe Pain, po Oxycodone 10 mg po q4hprn Moderate Pain, Acetaminophen 650 mg RC q6hprn Mild Pain -C/w Gabapentin 600 mg po QID and C/w Methocarbamol 1500 mg po q6hprn Muscle Spasms -Bowel Regimen with  Bisacodyl 10 mg RC Daily PRN moderate Constipation, Magnesium Citrate 1 bottle po Once PRN Sever Constipation, and Miralax 17 grams po Daily and Daily Prn -Antiemetics with po/IV Zofran 4 mg po q6hprn Nausea  Right Upper chest wall, supraclavicular area abscess -CT soft tissue neck showed supraclavicularfossa abscesson the right measuring 3x5 cm.Intramuscular extension of abscess into the right sternocleidomastoid muscle with several loculations -Gen. surgery consulted and did I&D of the neck area at the same time Orthopedics did Ankle one  -Right Neck Abscess Gram Stain showed Abundant WBC Present, Predominantly PMN with few Gram Positive Cocci in Pairs and in Chains; Cx showed No Growth at 1 Day  IVDU (Intravenous Drug User) and Polysubstance Abuse -Urine drug screen positive for Amphetamines and Benzodiazepines,Opiates,Cocaine, THC  -Continue pain control, with that she will not be provided narcotics on discharge -Patient counseled strongly forrecreational drugs cessation -Given Nicotine Patch 21 TD q24h -Hepatitis Panel Negative, HIV, and RPR Negative   Anxiety and Depression -C/w Alprazolam 1 mg po BIDprn Anxiety, Fluoxetine 20 mg po Daily, and Quetiapine 25-50 mg po qHS, and Trazodone 150 mg po qHS  Thrombocytosis  -Likely Reactive -Platelet Count trending up and went from 214 -> 423 -Continue to Monitor for S/Sx of Bleeding -Repeat CBC in AM   Hyperglycemia  -Patient's Glucose was 106 -Likely reactive  -Check Hemoglobin A1c  DVT prophylaxis: Enoxaparin 40 mg sq q24h Code Status: FULL CODE Family Communication: No family present at bedside Disposition Plan: Remain Inpatient for continued workup and Treatment; Patient going back to OR on Wednesday   Consultants:   Orthopedic Surgery  General Surgery   Procedures:  -Right Abscess Neck I and D done by Dr. Rosendo Gros -Right Ankle Debridement and Wound Vac Placement    Antimicrobials:  Anti-infectives (From  admission, onward)   Start     Dose/Rate Route Frequency Ordered Stop   11/22/17 1000  ceFAZolin (ANCEF) IVPB 2g/100 mL premix     2 g 200 mL/hr over 30 Minutes Intravenous To Short Stay 11/22/17 0445 11/22/17 1203   11/21/17 1818  piperacillin-tazobactam (ZOSYN) IVPB 3.375 g     3.375 g 12.5 mL/hr over 240 Minutes Intravenous Every 8 hours 11/21/17 1205     11/20/17 1600  vancomycin (VANCOCIN) IVPB 1000 mg/200 mL premix     1,000 mg 200 mL/hr over 60 Minutes Intravenous Every 12 hours 11/20/17 0755     11/20/17 1400  piperacillin-tazobactam (ZOSYN) IVPB 3.375 g  Status:  Discontinued     3.375 g 12.5 mL/hr over 240 Minutes Intravenous Every 8 hours 11/20/17 1237 11/21/17 1205   11/19/17 0400  vancomycin (VANCOCIN) IVPB 750 mg/150 ml premix  Status:  Discontinued     750 mg 150 mL/hr over 60 Minutes Intravenous Every 12 hours 11/18/17 2207 11/20/17 0755   11/18/17 1800  vancomycin (VANCOCIN) IVPB 1000 mg/200 mL premix     1,000 mg 200 mL/hr over 60 Minutes Intravenous  Once 11/18/17 1758 11/18/17 2021     Subjective: Seen and examined and laying in bed complaining of pain in ankle. No CP or SOB. No lightheadedness or dizziness. States she had a rough night.   Objective: Vitals:   11/22/17 1800 11/22/17 2157 11/23/17 0550 11/23/17 0753  BP: (!) 102/49 110/67 (!) 108/59 114/60  Pulse: 67 (!) 58 61 72  Resp: 18   16  Temp: 98 F (36.7 C) 97.8 F (36.6 C) 98.2 F (36.8 C) 98.3 F (36.8 C)  TempSrc: Oral Oral Oral Oral  SpO2: 95% 95% 97% 98%  Weight:      Height:        Intake/Output Summary (Last 24 hours) at 11/23/2017 1543 Last data filed at 11/23/2017 0557 Gross per 24 hour  Intake -  Output 1100 ml  Net -1100 ml   Filed Weights   11/18/17 2152 11/22/17 0946  Weight: 69 kg (152 lb 3.2 oz) 68.9 kg (152 lb)   Examination: Physical Exam:  Constitutional: WN/WD Caucasian female laying in bed calm but appears uncomfortable Eyes: Lids and conjunctivae normal, sclerae  anicteric  ENMT: External Ears, Nose appear normal. Grossly normal hearing. Extremely poor dentition  Neck: Appears normal, supple, no cervical masses, Has Wound covered on Right side of neck Respiratory: Diminished to auscultation bilaterally, no wheezing, rales, rhonchi or crackles. Normal respiratory effort and patient is not tachypenic. No accessory muscle use.  Cardiovascular: RRR, no murmurs / rubs / gallops. S1 and S2 auscultated. No extremity edema. Abdomen: Soft, non-tender, non-distended. No masses palpated. No appreciable hepatosplenomegaly. Bowel sounds positive x4.  GU: Deferred. Musculoskeletal: No clubbing / cyanosis of digits/nails. No joint deformity upper and lower extremities.  Skin: Has multiple Injection tract marks and skin changes from injecting on forearms. Right Foot wrapped and Right neck dressed  Neurologic: CN 2-12 grossly intact with no focal deficits.  Romberg sign and cerebellar reflexes not assessed.  Psychiatric: Normal judgment and insight. Alert and oriented x 3. Normal mood and appropriate affect.   Data Reviewed: I have personally reviewed following labs and imaging studies  CBC: Recent Labs  Lab 11/18/17 1922 11/19/17 0840 11/20/17 0438 11/22/17  5188 11/23/17 0523  WBC 22.8* 26.1* 19.4* 14.4* 14.0*  NEUTROABS 20.5*  --   --   --   --   HGB 14.7 13.3 12.3 11.3* 12.0  HCT 44.2 40.6 37.4 35.3* 37.0  MCV 87.2 88.8 88.6 90.5 89.4  PLT 214 243 265 334 416*   Basic Metabolic Panel: Recent Labs  Lab 11/18/17 1749 11/19/17 0840 11/20/17 0438 11/23/17 0523  NA 138 134* 136 139  K 3.5 3.7 3.4* 3.9  CL 100* 98* 100* 103  CO2 '23 25 26 27  ' GLUCOSE 131* 120* 120* 106*  BUN 20 21* 12 11  CREATININE 0.73 0.66 0.67 0.57  CALCIUM 8.6* 8.4* 8.0* 8.1*   GFR: Estimated Creatinine Clearance: 90.9 mL/min (by C-G formula based on SCr of 0.57 mg/dL). Liver Function Tests: Recent Labs  Lab 11/18/17 1749 11/23/17 0523  AST 22 23  ALT 18 26  ALKPHOS 84  90  BILITOT 0.6 0.2*  PROT 7.4 5.9*  ALBUMIN 3.0* 1.8*   Recent Labs  Lab 11/18/17 1749  LIPASE 20   No results for input(s): AMMONIA in the last 168 hours. Coagulation Profile: No results for input(s): INR, PROTIME in the last 168 hours. Cardiac Enzymes: No results for input(s): CKTOTAL, CKMB, CKMBINDEX, TROPONINI in the last 168 hours. BNP (last 3 results) No results for input(s): PROBNP in the last 8760 hours. HbA1C: No results for input(s): HGBA1C in the last 72 hours. CBG: No results for input(s): GLUCAP in the last 168 hours. Lipid Profile: No results for input(s): CHOL, HDL, LDLCALC, TRIG, CHOLHDL, LDLDIRECT in the last 72 hours. Thyroid Function Tests: No results for input(s): TSH, T4TOTAL, FREET4, T3FREE, THYROIDAB in the last 72 hours. Anemia Panel: No results for input(s): VITAMINB12, FOLATE, FERRITIN, TIBC, IRON, RETICCTPCT in the last 72 hours. Sepsis Labs: Recent Labs  Lab 11/18/17 1904  LATICACIDVEN 1.48    Recent Results (from the past 240 hour(s))  Blood culture (routine x 2)     Status: None   Collection Time: 11/18/17  7:04 PM  Result Value Ref Range Status   Specimen Description   Final    BLOOD LEFT ARM Performed at [facility], [place] [address], [place], [place] [address]    Special Requests   Final    BOTTLES DRAWN AEROBIC AND ANAEROBIC Blood Culture adequate volume Performed at [facility] [address], [place], [place] [address]    Culture   Final    NO GROWTH 5 DAYS Performed at [facility], [place] [address], [place], [place] [address]    Report Status 11/23/2017 FINAL  Final  Blood culture (routine x 2)     Status: None   Collection Time: 11/18/17  7:04 PM  Result Value Ref Range Status   Specimen Description   Final    BLOOD LEFT FOREARM Performed at [facility] [address], [place], [place] [address]    Special Requests   Final    BOTTLES DRAWN AEROBIC AND  ANAEROBIC Blood Culture adequate volume Performed at [facility] [address], [place], [place] [address]    Culture   Final    NO GROWTH 5 DAYS Performed at [facility], [place] [address], [place], [place] [address]    Report Status 11/23/2017 FINAL  Final  Surgical pcr screen     Status: Abnormal   Collection Time: 11/20/17 11:20 PM  Result Value Ref Range Status   MRSA, PCR  NEGATIVE NEGATIVE Final   Staphylococcus aureus POSITIVE (A) NEGATIVE Final    Comment: (NOTE) The Xpert SA Assay (FDA approved for NASAL specimens in patients 44 years of age and older), is one component of a comprehensive surveillance program. It is not intended to diagnose infection nor to guide or monitor treatment. Performed at [facility], [place] [address], [place], [place] [address]   Aerobic/Anaerobic Culture (surgical/deep wound)     Status: None (Preliminary result)   Collection Time: 11/22/17 12:13 PM  Result Value Ref Range Status   Specimen Description FOOT RIGHT ABSCESS  Final   Special Requests NONE  Final   Gram Stain   Final    ABUNDANT WBC PRESENT, PREDOMINANTLY PMN MODERATE GRAM POSITIVE COCCI IN PAIRS IN CHAINS    Culture   Final    CULTURE REINCUBATED FOR BETTER GROWTH Performed at [facility], [place] [address], [place], [place] [address]    Report Status PENDING  Incomplete  Aerobic/Anaerobic Culture (surgical/deep wound)     Status: None (Preliminary result)   Collection Time: 11/22/17 12:14 PM  Result Value Ref Range Status   Specimen Description FOOT RIGHT NECK  Final   Special Requests NONE  Final   Gram Stain   Final    ABUNDANT WBC PRESENT, PREDOMINANTLY PMN FEW GRAM POSITIVE COCCI IN PAIRS IN CHAINS    Culture   Final    NO GROWTH 1 DAY Performed at [facility], [place] [address], [place], [place] [address]    Report Status PENDING  Incomplete   Radiology Studies: No results found.  Scheduled Meds: . Chlorhexidine  Gluconate Cloth  6 each Topical Daily  . enoxaparin (LOVENOX) injection  40 mg Subcutaneous Q24H  . feeding supplement (ENSURE ENLIVE)  237 mL Oral BID BM  . FLUoxetine  20 mg Oral Daily  . gabapentin  600 mg Oral QID  . mouth rinse  15 mL Mouth Rinse BID  . multivitamin with minerals  1 tablet Oral Daily  . mupirocin ointment  1 application Nasal BID  . mupirocin ointment   Topical Daily  . nicotine  21 mg Transdermal Daily  . polyethylene glycol  17 g Oral Daily  . QUEtiapine  25-50 mg Oral QHS  . senna  1 tablet Oral BID  . traZODone  150 mg Oral QHS   Continuous Infusions: . sodium chloride 75 mL/hr at 11/22/17 1400  . sodium chloride 10 mL/hr at 11/22/17 1714  . lactated ringers Stopped (11/22/17 1400)  . piperacillin-tazobactam (ZOSYN)  IV 3.375 g (11/23/17 1105)  . vancomycin Stopped (11/23/17 1747)    LOS: 5 days   Kerney Elbe, DO [facility] Pager [phone]  If 7PM-7AM, please contact night-coverage [email or web address] Password [facility] 11/23/2017, 3:43 PM

## 2017-11-23 NOTE — Progress Notes (Signed)
IV infiltrated upon start of vanc dose.  Attemted IV with no success.  IV team consult ordered.  IV team was unable to insert IV.  MD paged

## 2017-11-23 NOTE — Progress Notes (Addendum)
Patient ID: Stacey BelfastStacy Talton, female   DOB: 07/27/1981, 37 y.o.   MRN: [id number]    1 Day Post-Op  Subjective: Patient having a lot of pain today.  Objective: Vital signs in last 24 hours: Temp:  [97.8 F (36.6 C)-98.3 F (36.8 C)] 98.3 F (36.8 C) (04/13 0753) Pulse Rate:  [58-72] 72 (04/13 0753) Resp:  [8-19] 16 (04/13 0753) BP: (102-114)/(49-67) 114/60 (04/13 0753) SpO2:  [92 %-98 %] 98 % (04/13 0753) Weight:  [68.9 kg (152 lb)] 68.9 kg (152 lb) (04/12 0946) Last BM Date: 11/18/17  Intake/Output from previous day: 04/12 0701 - 04/13 0700 In: 1000 [I.V.:1000] Out: 1205 [Urine:900; Drains:300; Blood:5] Intake/Output this shift: No intake/output data recorded.  PE: Neck: neck wound is clean.  All packing removed and replaced.  Erythema surrounding wound is much better.  Lab Results:  Recent Labs    11/22/17 0629 11/23/17 0523  WBC 14.4* 14.0*  HGB 11.3* 12.0  HCT 35.3* 37.0  PLT 334 423*   BMET Recent Labs    11/23/17 0523  NA 139  K 3.9  CL 103  CO2 27  GLUCOSE 106*  BUN 11  CREATININE 0.57  CALCIUM 8.1*   PT/INR No results for input(s): LABPROT, INR in the last 72 hours. CMP     Component Value Date/Time   NA 139 11/23/2017 0523   K 3.9 11/23/2017 0523   CL 103 11/23/2017 0523   CO2 27 11/23/2017 0523   GLUCOSE 106 (H) 11/23/2017 0523   BUN 11 11/23/2017 0523   CREATININE 0.57 11/23/2017 0523   CALCIUM 8.1 (L) 11/23/2017 0523   PROT 5.9 (L) 11/23/2017 0523   ALBUMIN 1.8 (L) 11/23/2017 0523   AST 23 11/23/2017 0523   ALT 26 11/23/2017 0523   ALKPHOS 90 11/23/2017 0523   BILITOT 0.2 (L) 11/23/2017 0523   GFRNONAA >60 11/23/2017 0523   GFRAA >60 11/23/2017 0523   Lipase     Component Value Date/Time   LIPASE 20 11/18/2017 1749       Studies/Results: No results found.  Anti-infectives: Anti-infectives (From admission, onward)   Start     Dose/Rate Route Frequency Ordered Stop   11/22/17 1000  ceFAZolin (ANCEF) IVPB 2g/100 mL premix      2 g 200 mL/hr over 30 Minutes Intravenous To Short Stay 11/22/17 0445 11/22/17 1203   11/21/17 1818  piperacillin-tazobactam (ZOSYN) IVPB 3.375 g     3.375 g 12.5 mL/hr over 240 Minutes Intravenous Every 8 hours 11/21/17 1205     11/20/17 1600  vancomycin (VANCOCIN) IVPB 1000 mg/200 mL premix     1,000 mg 200 mL/hr over 60 Minutes Intravenous Every 12 hours 11/20/17 0755     11/20/17 1400  piperacillin-tazobactam (ZOSYN) IVPB 3.375 g  Status:  Discontinued     3.375 g 12.5 mL/hr over 240 Minutes Intravenous Every 8 hours 11/20/17 1237 11/21/17 1205   11/19/17 0400  vancomycin (VANCOCIN) IVPB 750 mg/150 ml premix  Status:  Discontinued     750 mg 150 mL/hr over 60 Minutes Intravenous Every 12 hours 11/18/17 2207 11/20/17 0755   11/18/17 1800  vancomycin (VANCOCIN) IVPB 1000 mg/200 mL premix     1,000 mg 200 mL/hr over 60 Minutes Intravenous  Once 11/18/17 1758 11/18/17 2021       Assessment/Plan  right neck abscess, POD 1, s/p I&D -NS WD dressing changes BID -CX :  ABUNDANT WBC PRESENT, PREDOMINANTLY PMN  FEW GRAM POSITIVE COCCI IN PAIRS IN CHAINS -on vanc and  zosyn -will follow  Right ankle abscess -s/p I&D with wound VAC, per ortho  FEN - regular diet VTE - Lovenox ID - Vanc/Zosyn   LOS: 5 days    Letha Cape , [facility] 11/23/2017, 8:31 AM Pager: [phone]

## 2017-11-23 NOTE — Evaluation (Signed)
Physical Therapy Evaluation Patient Details Name: Stacey BelfastStacy Duffner MRN: [id number] DOB: 01/10/1981 Today's Date: 11/23/2017   History of Present Illness  37 y.o. female with history of IVDA (mostly cocaine, intermittently heroin)-presented with fever, worsening right foot/ankle pain, and erythema/swelling in a area in the lower part of her right neck. She was thought to have soft tissue infection/cellulitis-and started on vancomycin, however in spite of being on antimicrobial therapy for 48 hours, patient continued to have persistent fever, and worsening swelling of the right foot highly concerning for underlying septic arthritis and abscess in the lower part of right neck area. now s/p I&D of neck and R ankle. Plan to go to OR wed 4/17 for another I&D and wound closure.      Clinical Impression  Pt admitted with above diagnosis. Pt currently with functional limitations due to the deficits listed below (see PT Problem List). PTA, pt independent with all mobility. Pt plans to stay with her parents in handicap assessable home with 24 hour assistance. Upon eval presents with R foot pain, L hip pain that limit her mobility. Min A to stand, min guard for short distance ambulation which is limited by inability to bear weight on painful R foot and painful L hip.   Pt will benefit from skilled PT to increase their independence and safety with mobility to allow discharge to the venue listed below.       Follow Up Recommendations Home health PT;Supervision for mobility/OOB    Equipment Recommendations  Rolling walker with 5" wheels;Wheelchair (measurements PT)   Patient suffers from right ankle cellulitis and L hip pain  which impairs their ability to perform daily activities like ambulating in the home. A cane will not resolve  issue with performing activities of daily living. A wheelchair will allow patient to safely perform daily activities. Patient is not able to propel themselves in the home using a  standard weight wheelchair due to general weakness. Patient can self propel in the lightweight wheelchair.  Accessories: elevating leg rests (ELRs), wheel locks, extensions and anti-tippers.   Recommendations for Other Services OT consult     Precautions / Restrictions Precautions Precautions: Fall Restrictions Other Position/Activity Restrictions: WBAT      Mobility  Bed Mobility Overal bed mobility: Modified Independent                Transfers Overall transfer level: Needs assistance Equipment used: Rolling walker (2 wheeled) Transfers: Sit to/from Stand Sit to Stand: Min assist         General transfer comment: Min A to help power up due to pain in R ankle and L HIP  Ambulation/Gait Ambulation/Gait assistance: Min guard Ambulation Distance (Feet): 20 Feet Assistive device: Rolling walker (2 wheeled) Gait Pattern/deviations: Step-to pattern;Antalgic Gait velocity: decreased   General Gait Details: Patient able to walk short distances in room with hop to gait pattern. unable to bear weight on R foot due to pain. L hip pain prevents her from walking further at this visit.   Stairs            Wheelchair Mobility    Modified Rankin (Stroke Patients Only)       Balance Overall balance assessment: Mild deficits observed, not formally tested(due to R foot pain and L hip pain, patient reliant on RW )  Pertinent Vitals/Pain Pain Assessment: Faces Faces Pain Scale: Hurts even more Pain Location: R foot Pain Descriptors / Indicators: Discomfort;Grimacing Pain Intervention(s): Limited activity within patient's tolerance;Monitored during session;Premedicated before session;Repositioned    Home Living Family/patient expects to be discharged to:: Private residence Living Arrangements: Parent;Children Available Help at Discharge: Available 24 hours/day Type of Home: House Home Access: Level  entry     Home Layout: One level(stair entrance)        Prior Function Level of Independence: Independent               Hand Dominance        Extremity/Trunk Assessment   Upper Extremity Assessment Upper Extremity Assessment: Overall WFL for tasks assessed    Lower Extremity Assessment Lower Extremity Assessment: Overall WFL for tasks assessed       Communication      Cognition Arousal/Alertness: Awake/alert Behavior During Therapy: WFL for tasks assessed/performed Overall Cognitive Status: Within Functional Limits for tasks assessed                                        General Comments      Exercises General Exercises - Lower Extremity Hip ABduction/ADduction: 10 reps Straight Leg Raises: 10 reps   Assessment/Plan    PT Assessment Patient needs continued PT services  PT Problem List Decreased strength;Decreased range of motion;Decreased activity tolerance;Decreased balance;Pain;Decreased mobility       PT Treatment Interventions DME instruction    PT Goals (Current goals can be found in the Care Plan section)  Acute Rehab PT Goals Patient Stated Goal: stay with her mom and recover PT Goal Formulation: With patient/family Time For Goal Achievement: 11/30/17 Potential to Achieve Goals: Good    Frequency Min 3X/week   Barriers to discharge        Co-evaluation               AM-PAC PT "6 Clicks" Daily Activity  Outcome Measure Difficulty turning over in bed (including adjusting bedclothes, sheets and blankets)?: None Difficulty moving from lying on back to sitting on the side of the bed? : None Difficulty sitting down on and standing up from a chair with arms (e.g., wheelchair, bedside commode, etc,.)?: None Help needed moving to and from a bed to chair (including a wheelchair)?: A Little Help needed walking in hospital room?: A Little Help needed climbing 3-5 steps with a railing? : A Lot 6 Click Score: 20    End  of Session Equipment Utilized During Treatment: Gait belt Activity Tolerance: Patient tolerated treatment well Patient left: in bed;with call bell/phone within reach;with family/visitor present Nurse Communication: Mobility status PT Visit Diagnosis: Pain;Unsteadiness on feet (R26.81);Other abnormalities of gait and mobility (R26.89) Pain - Right/Left: Right Pain - part of body: Ankle and joints of foot    Time: 1610-9604 PT Time Calculation (min) (ACUTE ONLY): 24 min   Charges:   PT Evaluation $PT Eval Low Complexity: 1 Low PT Treatments $Gait Training: 8-22 mins   PT G Codes:        Etta Grandchild, PT, DPT Acute Rehab Services Pager: [phone]    Etta Grandchild 11/23/2017, 4:18 PM

## 2017-11-24 LAB — CBC WITH DIFFERENTIAL/PLATELET
BASOS ABS: 0 10*3/uL (ref 0.0–0.1)
BASOS PCT: 0 %
Basophils Absolute: 0 10*3/uL (ref 0.0–0.1)
Basophils Relative: 0 %
EOS ABS: 0.2 10*3/uL (ref 0.0–0.7)
EOS PCT: 1 %
Eosinophils Absolute: 0.2 10*3/uL (ref 0.0–0.7)
Eosinophils Relative: 1 %
HEMATOCRIT: 36 % (ref 36.0–46.0)
HEMATOCRIT: 35.4 % — AB (ref 36.0–46.0)
Hemoglobin: 11.4 g/dL — ABNORMAL LOW (ref 12.0–15.0)
Hemoglobin: 11.4 g/dL — ABNORMAL LOW (ref 12.0–15.0)
LYMPHS PCT: 24 %
Lymphocytes Relative: 19 %
Lymphs Abs: 2.8 10*3/uL (ref 0.7–4.0)
Lymphs Abs: 2.5 10*3/uL (ref 0.7–4.0)
MCH: 28.6 pg (ref 26.0–34.0)
MCH: 28.8 pg (ref 26.0–34.0)
MCHC: 31.7 g/dL (ref 30.0–36.0)
MCHC: 32.2 g/dL (ref 30.0–36.0)
MCV: 90.5 fL (ref 78.0–100.0)
MCV: 89.4 fL (ref 78.0–100.0)
MONO ABS: 0.5 10*3/uL (ref 0.1–1.0)
Monocytes Absolute: 0.6 10*3/uL (ref 0.1–1.0)
Monocytes Relative: 4 %
Monocytes Relative: 5 %
NEUTROS ABS: 8.6 10*3/uL — AB (ref 1.7–7.7)
NEUTROS ABS: 9.6 10*3/uL — AB (ref 1.7–7.7)
NEUTROS PCT: 75 %
Neutrophils Relative %: 71 %
PLATELETS: 325 10*3/uL (ref 150–400)
PLATELETS: 303 10*3/uL (ref 150–400)
RBC: 3.98 MIL/uL (ref 3.87–5.11)
RBC: 3.96 MIL/uL (ref 3.87–5.11)
RDW: 14.2 % (ref 11.5–15.5)
RDW: 13.7 % (ref 11.5–15.5)
WBC: 12.1 10*3/uL — ABNORMAL HIGH (ref 4.0–10.5)
WBC: 12.9 10*3/uL — ABNORMAL HIGH (ref 4.0–10.5)

## 2017-11-24 LAB — COMPREHENSIVE METABOLIC PANEL
ALT: 32 U/L (ref 14–54)
ANION GAP: 10 (ref 5–15)
AST: 30 U/L (ref 15–41)
Albumin: 1.9 g/dL — ABNORMAL LOW (ref 3.5–5.0)
Alkaline Phosphatase: 74 U/L (ref 38–126)
BILIRUBIN TOTAL: 0.3 mg/dL (ref 0.3–1.2)
BUN: 12 mg/dL (ref 6–20)
CHLORIDE: 104 mmol/L (ref 101–111)
CO2: 25 mmol/L (ref 22–32)
Calcium: 7.9 mg/dL — ABNORMAL LOW (ref 8.9–10.3)
Creatinine, Ser: 0.71 mg/dL (ref 0.44–1.00)
GFR calc Af Amer: >60 mL/min (ref >60)
Glucose, Bld: 104 mg/dL — ABNORMAL HIGH (ref 65–99)
POTASSIUM: 3.8 mmol/L (ref 3.5–5.1)
Sodium: 139 mmol/L (ref 135–145)
TOTAL PROTEIN: 5.9 g/dL — AB (ref 6.5–8.1)

## 2017-11-24 LAB — PHOSPHORUS: PHOSPHORUS: 4.3 mg/dL (ref 2.5–4.6)

## 2017-11-24 LAB — HEMOGLOBIN A1C
HEMOGLOBIN A1C: 5.7 % — AB (ref 4.8–5.6)
Mean Plasma Glucose: 116.89 mg/dL

## 2017-11-24 LAB — MAGNESIUM: Magnesium: 2.1 mg/dL (ref 1.7–2.4)

## 2017-11-24 MED ORDER — SODIUM CHLORIDE 0.9 % IV SOLN
2.0000 g | INTRAVENOUS | Status: DC
  Filled 2017-11-24 (×3): qty 2000

## 2017-11-24 MED ORDER — SODIUM CHLORIDE 0.9 % IV SOLN
2.0000 g | INTRAVENOUS | Status: DC
  Administered 2017-11-24 – 2017-12-02 (×46): 2 g via INTRAVENOUS
  Filled 2017-11-24 (×49): qty 2000

## 2017-11-24 NOTE — Evaluation (Signed)
Occupational Therapy Evaluation Patient Details Name: Stacey Martinez MRN: [id number] DOB: 03-20-1981 Today's Date: 11/24/2017    History of Present Illness 37 y.o. female with history of IVDA (mostly cocaine, intermittently heroin)-presented with fever, worsening right foot/ankle pain, and erythema/swelling in a area in the lower part of her right neck. She was thought to have soft tissue infection/cellulitis-and started on vancomycin, however in spite of being on antimicrobial therapy for 48 hours, patient continued to have persistent fever, and worsening swelling of the right foot highly concerning for underlying septic arthritis and abscess in the lower part of right neck area. now s/p I&D of neck and R ankle. Plan to go to OR wed 4/17 for another I&D and wound closure.    Clinical Impression   PTA, pt was living with her mother and was independent. Currently, pt requires Min A for LB ADLs and Min Guard-Min A for functional mobility using RW. Pt limited by significant pain in RLE impacting her activity tolerance and balance during ADLs and functional mobility. Pt would benefit from further acute OT to facilitate safe dc. Pending pt's progress, recommend dc to home with HHOT for further OT to optimize safety, independence with ADLs, and return to PLOF.      Follow Up Recommendations  Home health OT;Supervision/Assistance - 24 hour(Pending pt progress and surgery results)    Equipment Recommendations  None recommended by OT    Recommendations for Other Services PT consult     Precautions / Restrictions Precautions Precautions: Fall Restrictions Weight Bearing Restrictions: Yes LUE Weight Bearing: Weight bearing as tolerated RLE Weight Bearing: Weight bearing as tolerated      Mobility Bed Mobility Overal bed mobility: Modified Independent                Transfers Overall transfer level: Needs assistance Equipment used: Rolling walker (2 wheeled) Transfers: Sit to/from  Stand Sit to Stand: Min assist         General transfer comment: Min A for power up and to steady in standing    Balance Overall balance assessment: Mild deficits observed, not formally tested(due to R foot pain and L hip pain, patient reliant on RW )                                         ADL either performed or assessed with clinical judgement   ADL Overall ADL's : Needs assistance/impaired Eating/Feeding: Set up;Sitting;Bed level   Grooming: Applying deodorant;Set up;Sitting Grooming Details (indicate cue type and reason): Sitting EOB, pt performing oral care with set up Upper Body Bathing: Set up;Sitting   Lower Body Bathing: Minimal assistance;Sit to/from stand   Upper Body Dressing : Set up;Sitting   Lower Body Dressing: Minimal assistance;Sit to/from stand   Toilet Transfer: Min guard;Ambulation;Regular Toilet;RW;Grab bars   Toileting- Clothing Manipulation and Hygiene: Minimal assistance;Sit to/from stand Toileting - Clothing Manipulation Details (indicate cue type and reason): Min A for balance and managing gown     Functional mobility during ADLs: Min guard;Rolling walker General ADL Comments: Pt with decreased funcitonal performance due to pain in RLE.     Vision         Perception     Praxis      Pertinent Vitals/Pain Pain Assessment: Faces Faces Pain Scale: Hurts even more Pain Location: R foot Pain Descriptors / Indicators: Discomfort;Grimacing Pain Intervention(s): Monitored during session;Limited activity within patient's tolerance;Repositioned  Hand Dominance Right   Extremity/Trunk Assessment Upper Extremity Assessment Upper Extremity Assessment: Overall WFL for tasks assessed   Lower Extremity Assessment Lower Extremity Assessment: Defer to PT evaluation;RLE deficits/detail RLE Deficits / Details: Right foot with wound   Cervical / Trunk Assessment Cervical / Trunk Assessment: Normal;Other exceptions Cervical /  Trunk Exceptions: Prior back surgery   Communication Communication Communication: No difficulties   Cognition Arousal/Alertness: Awake/alert Behavior During Therapy: WFL for tasks assessed/performed Overall Cognitive Status: Within Functional Limits for tasks assessed                                     General Comments       Exercises     Shoulder Instructions      Home Living Family/patient expects to be discharged to:: Private residence Living Arrangements: Parent;Children;Non-relatives/Friends(Aunt, Mother, Son (who has high functioning ASD)) Available Help at Discharge: Available 24 hours/day Type of Home: House Home Access: Level entry     Home Layout: One level(stair entrance)     Bathroom Shower/Tub: Tub/shower unit;Walk-in shower   Bathroom Toilet: Standard Bathroom Accessibility: Yes   Home Equipment: Tub bench;Shower seat(Aunt and uncle have DME for uncle)          Prior Functioning/Environment Level of Independence: Independent        Comments: ADLs, IADLs, driving, on disability        OT Problem List: Decreased activity tolerance;Impaired balance (sitting and/or standing);Decreased knowledge of use of DME or AE;Decreased knowledge of precautions;Pain      OT Treatment/Interventions: Self-care/ADL training;Therapeutic exercise;Energy conservation;DME and/or AE instruction;Therapeutic activities;Patient/family education    OT Goals(Current goals can be found in the care plan section) Acute Rehab OT Goals Patient Stated Goal: Play with her son OT Goal Formulation: With patient Time For Goal Achievement: 12/08/17 Potential to Achieve Goals: Good ADL Goals Pt Will Perform Grooming: with set-up;with supervision;standing Pt Will Perform Lower Body Dressing: with set-up;with supervision;sit to/from stand Pt Will Transfer to Toilet: with set-up;with supervision;regular height toilet Pt Will Perform Toileting - Clothing Manipulation  and hygiene: with set-up;with supervision;sit to/from stand Pt Will Perform Tub/Shower Transfer: Tub transfer;tub bench;ambulating;rolling walker;with set-up;with supervision  OT Frequency: Min 2X/week   Barriers to D/C:            Co-evaluation              AM-PAC PT "6 Clicks" Daily Activity     Outcome Measure Help from another person eating meals?: None Help from another person taking care of personal grooming?: A Little Help from another person toileting, which includes using toliet, bedpan, or urinal?: A Little Help from another person bathing (including washing, rinsing, drying)?: A Little Help from another person to put on and taking off regular upper body clothing?: A Little Help from another person to put on and taking off regular lower body clothing?: A Little 6 Click Score: 19   End of Session Equipment Utilized During Treatment: Gait belt;Rolling walker Nurse Communication: Mobility status;Weight bearing status  Activity Tolerance: Patient tolerated treatment well;Patient limited by pain Patient left: in bed;with call bell/phone within reach;with nursing/sitter in room  OT Visit Diagnosis: Unsteadiness on feet (R26.81);Other abnormalities of gait and mobility (R26.89);Muscle weakness (generalized) (M62.81);Pain Pain - Right/Left: Right Pain - part of body: Leg;Ankle and joints of foot                Time: [id number] OT Time Calculation (  min): 27 min Charges:  OT General Charges $OT Visit: 1 Visit OT Evaluation $OT Eval Moderate Complexity: 1 Mod OT Treatments $Self Care/Home Management : 8-22 mins G-Codes:     Kyser Wandel MSOT, OTR/L Acute Rehab Pager: [phone] Office: [phone]  Theodoro Grist Ilan Kahrs 11/24/2017, 3:15 PM

## 2017-11-24 NOTE — Progress Notes (Signed)
PROGRESS NOTE    Stacey Martinez  QZE:[id number] DOB: 1980-11-13 DOA: 11/18/2017 PCP: Chapman Moss, MD   Brief Narrative:  Brief Narrative   37 y.o.femalewith history of IVDA (mostly cocaine, intermittently heroin)-presented with fever, worsening right foot/ankle pain, and erythema/swelling in a area in the lower part of her right neck. She was thought to have soft tissue infection/cellulitis-and started on vancomycin,however in spite of being on antimicrobial therapy for 48 hours,patient continuedto have persistent fever, and worsening swelling of the right foot highly concerning for underlying septic arthritis andabscess in the lower part of right neck area.  Patient was transferred to [facility] I&D, seen by Dr Sharol Given and it was done on 4/12 along with I and D of Neck Abscess by Dr. Rosendo Gros. She is POD 2 and complaining of pain.   This AM she was seen in bed resting; Nurse paged this Afternoon stating that when he was doing the dressing change, neck wound was profusely bleeding and stopped eventually after pressure was held for at least 5-10 minutes. General Surgery was then paged and awaiting evaluation.   Assessment & Plan:   Principal Problem:   Cellulitis Active Problems:   Leukocytosis   IVDU (intravenous drug user)   Anxiety and depression   Cellulitis of right ankle   Abscess of tendon sheath, right ankle and foot  Right Ankle/Foot cellulitis with concern for septic arthritis in the setting of IV drug usewith Abscess s/p I and D POD 2  -ContinuedIV Vancomycin and Zosyn but will de-escalate now that Cx growing Strep Pyogenes; Discussed Case with ID Dr. Linus Salmons and recommended changing to Penicillin or Ampicillin; Recommended at least 3 week treatment and pull Midline and change to po prior to D/C given IVDU  -Afebrile at 98.3 -Orthopedics consulted and did Incision and Debridement on 4/12 -WBC improving and went from 26.1-> 12.1 -ESR was 82 -Blood Cultures showed No  Growth at 5 Days -Transthoracic ECHO on 4/9 showed EF of 55-60%, no obvious vegetations -Right Foot Abscess Gram Stain showed Abundant WBC, Predominantly PMN with Moderate Gram Positive Cocci in Pairs and in Chains -Cx showed Few Group A Strep (Strep Pyogenes) and No Anaerobes Isolated but Cx in progress for 5 days. -Of not MRSA PCR Negative but did grow Staphylococcus Aures  -Pain Control with 2 mg IV Morphine q4hprn Severe Pain, po Oxycodone 10 mg po q4hprn Moderate Pain, Acetaminophen 650 mg RC q6hprn Mild Pain -C/w Gabapentin 600 mg po QID and C/w Methocarbamol 1500 mg po q6hprn Muscle Spasms -Bowel Regimen with Bisacodyl 10 mg RC Daily PRN moderate Constipation, Magnesium Citrate 1 bottle po Once PRN Sever Constipation, and Miralax 17 grams po Daily and Daily Prn -Antiemetics with po/IV Zofran 4 mg po q6hprn Nausea -Lost IV Access so now has a Midline   Right Upper chest wall, supraclavicular area abscess -CT soft tissue neck showed supraclavicularfossa abscesson the right measuring 3x5 cm.Intramuscular extension of abscess into the right sternocleidomastoid muscle with several loculations -Gen. surgery consulted and did I&D of the neck area at the same time Orthopedics did Ankle one  -Right Neck Abscess Gram Stain showed Abundant WBC Present, Predominantly PMN with few Gram Positive Cocci in Pairs and in Chains; Cx showed Rare Group A Strep (Strep Pyogenes) Isolated -Dr. Dema Severin of General Surgery recommending BID Dressing Changes to the neck with wet to Dry Kerlex -Abscess had Dressing changed by nurse and started Bleeding; General Surgery Paged.    IVDU (Intravenous Drug User) and Polysubstance Abuse -Urine drug  screen positive for Amphetamines and Benzodiazepines,Opiates,Cocaine, THC  -Continue pain control, with that she will not be provided narcotics on discharge -Patient counseled strongly forrecreational drugs cessation -Given Nicotine Patch 21 TD q24h -Hepatitis Panel  Negative, HIV, and RPR Negative  -Has a Midline currently but will D/C as soon as Able   Anxiety and Depression -C/w Alprazolam 1 mg po BIDprn Anxiety, Fluoxetine 20 mg po Daily, and Quetiapine 25-50 mg po qHS, and Trazodone 150 mg po qHS  Thrombocytosis  -Likely Reactive and improved today.  -Platelet Count trending up and went from 214 -> 423 -> 325 -Continue to Monitor for S/Sx of Bleeding -Repeat CBC in AM   Hyperglycemia  -Patient's Glucose on BMP/CMP ranging from 104-120 in AM -Likely reactive  -Check Hemoglobin A1c  Normocytic Anemia -Hb/Hct went from 12.0/37.0 -> 11.4/36.0 -Had bleeding from Neck Wound this Afternoon so will check CBC  -Continue to Monitor for S/Sx of Bleeding -Repeat CBC in AM   Hypocalcemia -Ca2+ was 7.9 on CMP -Check Ionized Calcium -Repeat CMP in AM   DVT prophylaxis: Enoxaparin 40 mg sq q24h Code Status: FULL CODE Family Communication: No family present at bedside Disposition Plan: Remain Inpatient for continued workup and Treatment; Patient going back to OR on Wednesday for repeat I and D and Wound Closure; OT recommending Home Health OT  Consultants:   Orthopedic Surgery  General Surgery   Discussed Case with ID Dr. Linus Salmons   Procedures:  -Right Abscess Neck I and D done by Dr. Rosendo Gros -Right Ankle Debridement and Wound Vac Placement    Antimicrobials:  Anti-infectives (From admission, onward)   Start     Dose/Rate Route Frequency Ordered Stop   11/23/17 2238  piperacillin-tazobactam (ZOSYN) IVPB 3.375 g     3.375 g 12.5 mL/hr over 240 Minutes Intravenous Every 8 hours 11/23/17 2238     11/22/17 1000  ceFAZolin (ANCEF) IVPB 2g/100 mL premix     2 g 200 mL/hr over 30 Minutes Intravenous To Short Stay 11/22/17 0445 11/22/17 1203   11/21/17 1818  piperacillin-tazobactam (ZOSYN) IVPB 3.375 g  Status:  Discontinued     3.375 g 12.5 mL/hr over 240 Minutes Intravenous Every 8 hours 11/21/17 1205 11/23/17 2238   11/20/17 1600   vancomycin (VANCOCIN) IVPB 1000 mg/200 mL premix     1,000 mg 200 mL/hr over 60 Minutes Intravenous Every 12 hours 11/20/17 0755     11/20/17 1400  piperacillin-tazobactam (ZOSYN) IVPB 3.375 g  Status:  Discontinued     3.375 g 12.5 mL/hr over 240 Minutes Intravenous Every 8 hours 11/20/17 1237 11/21/17 1205   11/19/17 0400  vancomycin (VANCOCIN) IVPB 750 mg/150 ml premix  Status:  Discontinued     750 mg 150 mL/hr over 60 Minutes Intravenous Every 12 hours 11/18/17 2207 11/20/17 0755   11/18/17 1800  vancomycin (VANCOCIN) IVPB 1000 mg/200 mL premix     1,000 mg 200 mL/hr over 60 Minutes Intravenous  Once 11/18/17 1758 11/18/17 2021     Subjective: Seen and examined at bedside in was complaining of pain.  States she had a rough night and felt hot and sweaty.  States that she lost IV access last night and had a midline placed.  No nausea or vomiting but is complaining of right ankle pain.  Objective: Vitals:   11/23/17 0753 11/23/17 1538 11/23/17 1933 11/24/17 0605  BP: 114/60 125/76 126/69 112/79  Pulse: 72 70 74 68  Resp: 16     Temp: 98.3 F (36.8 C)  98 F (36.7 C) 98.1 F (36.7 C) 98.3 F (36.8 C)  TempSrc: Oral Oral Oral Oral  SpO2: 98% 99% 99% 97%  Weight:      Height:        Intake/Output Summary (Last 24 hours) at 11/24/2017 0816 Last data filed at 11/24/2017 3383 Gross per 24 hour  Intake 317.67 ml  Output 1325 ml  Net -1007.33 ml   Filed Weights   11/18/17 2152 11/22/17 0946  Weight: 69 kg (152 lb 3.2 oz) 68.9 kg (152 lb)   Examination: Physical Exam:  Constitutional: Well-nourished well-developed Caucasian female laying in bed complaining of ankle pain Eyes: Sclera anicteric.  Lids and conjunctiva normal. ENMT: External ears and nose appear normal.  Grossly normal hearing.  Extremely poor dentition. Neck: Appears supple with no JVD.  Has a wound covered on the right side of her neck Respiratory: Diminished to auscultation but unlabored breathing.  No  appreciable wheezing, rales, rhonchi. Cardiovascular: Regular rate and rhythm.  No lower extremity edema. Abdomen: Soft, nontender, nondistended.  Bowel sounds present in all 4 quadrants GU: Deferred Musculoskeletal: Contractures, no cyanosis; has right arm midline.  Right ankle wrapped. Skin: Has multiple injection track marks and skin changes from injecting on the forearms from IV drug use.  Right foot was wrapped and has wound VAC connected to it.  Right neck wound was also wrapped Neurologic: Cranial nerves II through XII grossly intact with no appreciable focal deficits Psychiatric: Normal mood and affect.  Awake and alert  Data Reviewed: I have personally reviewed following labs and imaging studies  CBC: Recent Labs  Lab 11/18/17 1922 11/19/17 0840 11/20/17 0438 11/22/17 0629 11/23/17 0523 11/24/17 0437  WBC 22.8* 26.1* 19.4* 14.4* 14.0* 12.1*  NEUTROABS 20.5*  --   --   --   --  8.6*  HGB 14.7 13.3 12.3 11.3* 12.0 11.4*  HCT 44.2 40.6 37.4 35.3* 37.0 36.0  MCV 87.2 88.8 88.6 90.5 89.4 90.5  PLT 214 243 265 334 423* 291   Basic Metabolic Panel: Recent Labs  Lab 11/18/17 1749 11/19/17 0840 11/20/17 0438 11/23/17 0523 11/24/17 0437  NA 138 134* 136 139 139  K 3.5 3.7 3.4* 3.9 3.8  CL 100* 98* 100* 103 104  CO2 '23 25 26 27 25  ' GLUCOSE 131* 120* 120* 106* 104*  BUN 20 21* '12 11 12  ' CREATININE 0.73 0.66 0.67 0.57 0.71  CALCIUM 8.6* 8.4* 8.0* 8.1* 7.9*  MG  --   --   --   --  2.1  PHOS  --   --   --   --  4.3   GFR: Estimated Creatinine Clearance: 90.9 mL/min (by C-G formula based on SCr of 0.71 mg/dL). Liver Function Tests: Recent Labs  Lab 11/18/17 1749 11/23/17 0523 11/24/17 0437  AST '22 23 30  ' ALT 18 26 32  ALKPHOS 84 90 74  BILITOT 0.6 0.2* 0.3  PROT 7.4 5.9* 5.9*  ALBUMIN 3.0* 1.8* 1.9*   Recent Labs  Lab 11/18/17 1749  LIPASE 20   No results for input(s): AMMONIA in the last 168 hours. Coagulation Profile: No results for input(s): INR,  PROTIME in the last 168 hours. Cardiac Enzymes: No results for input(s): CKTOTAL, CKMB, CKMBINDEX, TROPONINI in the last 168 hours. BNP (last 3 results) No results for input(s): PROBNP in the last 8760 hours. HbA1C: No results for input(s): HGBA1C in the last 72 hours. CBG: No results for input(s): GLUCAP in the last 168 hours. Lipid Profile:  No results for input(s): CHOL, HDL, LDLCALC, TRIG, CHOLHDL, LDLDIRECT in the last 72 hours. Thyroid Function Tests: No results for input(s): TSH, T4TOTAL, FREET4, T3FREE, THYROIDAB in the last 72 hours. Anemia Panel: No results for input(s): VITAMINB12, FOLATE, FERRITIN, TIBC, IRON, RETICCTPCT in the last 72 hours. Sepsis Labs: Recent Labs  Lab 11/18/17 1904  LATICACIDVEN 1.48    Recent Results (from the past 240 hour(s))  Blood culture (routine x 2)     Status: None   Collection Time: 11/18/17  7:04 PM  Result Value Ref Range Status   Specimen Description   Final    BLOOD LEFT ARM Performed at [facility], [place] [address] [place], [place] [address]    Special Requests   Final    BOTTLES DRAWN AEROBIC AND ANAEROBIC Blood Culture adequate volume Performed at [place] [address], [place], [place] [address]    Culture   Final    NO GROWTH 5 DAYS Performed at [facility], [place] [address], [place], [place] [address]    Report Status 11/23/2017 FINAL  Final  Blood culture (routine x 2)     Status: None   Collection Time: 11/18/17  7:04 PM  Result Value Ref Range Status   Specimen Description   Final    BLOOD LEFT FOREARM Performed at [place] [address], [place], [place] [address]    Special Requests   Final    BOTTLES DRAWN AEROBIC AND ANAEROBIC Blood Culture adequate volume Performed at [place] [address] [place], [place] [address]    Culture   Final    NO GROWTH 5 DAYS Performed at [facility], [place] [address] [place], [place] [address]    Report Status 11/23/2017 FINAL  Final  Surgical pcr screen     Status: Abnormal   Collection Time: 11/20/17 11:20 PM  Result Value Ref Range Status   MRSA, PCR NEGATIVE NEGATIVE Final   Staphylococcus aureus POSITIVE (A) NEGATIVE Final    Comment: (NOTE) The Xpert SA Assay (FDA approved for NASAL specimens in patients 60 years of age and older), is one component of a comprehensive surveillance program. It is not intended to diagnose infection nor to guide or monitor treatment. Performed at [facility], [place] [address], [place], [place] [address]   Aerobic/Anaerobic Culture (surgical/deep wound)     Status: None (Preliminary result)   Collection Time: 11/22/17 12:13 PM  Result Value Ref Range Status   Specimen Description FOOT RIGHT ABSCESS  Final   Special Requests NONE  Final   Gram Stain   Final    ABUNDANT WBC PRESENT, PREDOMINANTLY PMN MODERATE GRAM POSITIVE COCCI IN PAIRS IN CHAINS Performed at [facility], [place] [address] [place], [place] [address]    Culture FEW GROUP A STREP (S.PYOGENES) ISOLATED  Final   Report Status PENDING  Incomplete  Aerobic/Anaerobic Culture (surgical/deep wound)     Status: None (Preliminary result)   Collection Time: 11/22/17 12:14 PM  Result Value Ref Range Status   Specimen Description FOOT RIGHT NECK  Final   Special Requests NONE  Final   Gram Stain   Final    ABUNDANT WBC PRESENT, PREDOMINANTLY PMN FEW GRAM POSITIVE COCCI IN PAIRS IN CHAINS    Culture   Final    NO GROWTH 1 DAY Performed at [facility], [place] [address] [place], [place] [address]  Report Status PENDING  Incomplete   Radiology Studies: No results found.  Scheduled Meds: . Chlorhexidine Gluconate Cloth  6 each Topical Daily  . enoxaparin (LOVENOX) injection  40 mg Subcutaneous Q24H  . feeding supplement (ENSURE ENLIVE)  237 mL Oral BID BM  . FLUoxetine  20 mg Oral Daily  . gabapentin  600 mg Oral QID  . mouth  rinse  15 mL Mouth Rinse BID  . multivitamin with minerals  1 tablet Oral Daily  . mupirocin ointment  1 application Nasal BID  . mupirocin ointment   Topical Daily  . nicotine  21 mg Transdermal Daily  . polyethylene glycol  17 g Oral Daily  . QUEtiapine  25-50 mg Oral QHS  . senna  1 tablet Oral BID  . traZODone  150 mg Oral QHS   Continuous Infusions: . sodium chloride 75 mL/hr at 11/22/17 1400  . sodium chloride 10 mL/hr at 11/22/17 1714  . lactated ringers Stopped (11/22/17 1400)  . piperacillin-tazobactam (ZOSYN)  IV 3.375 g (11/24/17 7482)  . vancomycin Stopped (11/24/17 0513)    LOS: 6 days   Kerney Elbe, DO [facility] Pager [phone]  If 7PM-7AM, please contact night-coverage [email or web address] Password [email or web address] 11/24/2017, 8:16 AM

## 2017-11-24 NOTE — Anesthesia Postprocedure Evaluation (Signed)
Anesthesia Post Note  Patient: Stacey Martinez  Procedure(s) Performed: RIGHT ANKLE DEBRIDEMENT AND PLACEMENT OF WOUND VAC  Drainege of Abcess left arm (Right ) INCISION AND DRAINAGE ABSCESS NECK ABCESS (Right Neck) IRRIGATION AND DEBRIDEMENT ABSCESS Neck     Anesthesia Type: General    Last Vitals:  Vitals:   11/24/17 0605 11/24/17 1657  BP: 112/79 118/60  Pulse: 68 75  Resp:    Temp: 36.8 C 36.8 C  SpO2: 97% 98%    Last Pain:  Vitals:   11/24/17 1734  TempSrc:   PainSc: 7                  Lewie LoronJohn Cruz Devilla

## 2017-11-24 NOTE — Progress Notes (Signed)
Patient ID: [name], female   DOB: 11/14/1980, 37 y.o.   MRN: [id number]    2 Days Post-Op  Subjective: Patient stated night nurse did not change her dressing (according to day RN this is correct.  This issues was addressed with AD of the department.)  Objective: Vital signs in last 24 hours: Temp:  [98 F (36.7 C)-98.3 F (36.8 C)] 98.3 F (36.8 C) (04/14 0605) Pulse Rate:  [68-74] 68 (04/14 0605) BP: (112-126)/(69-79) 112/79 (04/14 0605) SpO2:  [97 %-99 %] 97 % (04/14 0605) Last BM Date: 11/23/17  Intake/Output from previous day: 04/13 0701 - 04/14 0700 In: 317.7 [I.V.:217.7; IV Piggyback:100] Out: 1325 [Urine:900; Drains:425] Intake/Output this shift: No intake/output data recorded.  PE: Neck: wound is clean and packed.  No further purulent drainage  Lab Results:  Recent Labs    11/23/17 0523 11/24/17 0437  WBC 14.0* 12.1*  HGB 12.0 11.4*  HCT 37.0 36.0  PLT 423* 325   BMET Recent Labs    11/23/17 0523 11/24/17 0437  NA 139 139  K 3.9 3.8  CL 103 104  CO2 27 25  GLUCOSE 106* 104*  BUN 11 12  CREATININE 0.57 0.71  CALCIUM 8.1* 7.9*   PT/INR No results for input(s): LABPROT, INR in the last 72 hours. CMP     Component Value Date/Time   NA 139 11/24/2017 0437   K 3.8 11/24/2017 0437   CL 104 11/24/2017 0437   CO2 25 11/24/2017 0437   GLUCOSE 104 (H) 11/24/2017 0437   BUN 12 11/24/2017 0437   CREATININE 0.71 11/24/2017 0437   CALCIUM 7.9 (L) 11/24/2017 0437   PROT 5.9 (L) 11/24/2017 0437   ALBUMIN 1.9 (L) 11/24/2017 0437   AST 30 11/24/2017 0437   ALT 32 11/24/2017 0437   ALKPHOS 74 11/24/2017 0437   BILITOT 0.3 11/24/2017 0437   GFRNONAA >60 11/24/2017 0437   GFRAA >60 11/24/2017 0437   Lipase     Component Value Date/Time   LIPASE 20 11/18/2017 1749       Studies/Results: No results found.  Anti-infectives: Anti-infectives (From admission, onward)   Start     Dose/Rate Route Frequency Ordered Stop   11/23/17 2238   piperacillin-tazobactam (ZOSYN) IVPB 3.375 g     3.375 g 12.5 mL/hr over 240 Minutes Intravenous Every 8 hours 11/23/17 2238     11/22/17 1000  ceFAZolin (ANCEF) IVPB 2g/100 mL premix     2 g 200 mL/hr over 30 Minutes Intravenous To Short Stay 11/22/17 0445 11/22/17 1203   11/21/17 1818  piperacillin-tazobactam (ZOSYN) IVPB 3.375 g  Status:  Discontinued     3.375 g 12.5 mL/hr over 240 Minutes Intravenous Every 8 hours 11/21/17 1205 11/23/17 2238   11/20/17 1600  vancomycin (VANCOCIN) IVPB 1000 mg/200 mL premix     1,000 mg 200 mL/hr over 60 Minutes Intravenous Every 12 hours 11/20/17 0755     11/20/17 1400  piperacillin-tazobactam (ZOSYN) IVPB 3.375 g  Status:  Discontinued     3.375 g 12.5 mL/hr over 240 Minutes Intravenous Every 8 hours 11/20/17 1237 11/21/17 1205   11/19/17 0400  vancomycin (VANCOCIN) IVPB 750 mg/150 ml premix  Status:  Discontinued     750 mg 150 mL/hr over 60 Minutes Intravenous Every 12 hours 11/18/17 2207 11/20/17 0755   11/18/17 1800  vancomycin (VANCOCIN) IVPB 1000 mg/200 mL premix     1,000 mg 200 mL/hr over 60 Minutes Intravenous  Once 11/18/17 1758 11/18/17 2021  Assessment/Plan right neck abscess, POD 2, s/p I&D -NS WD dressing changes BID, RN to do on day shift today after pain medications given.  Discussed with him -CX :  ABUNDANT WBC PRESENT, PREDOMINANTLY PMN  FEW GRAM POSITIVE COCCI IN PAIRS IN CHAINS -on vanc and zosyn -will follow  Right ankle abscess -s/p I&D with wound VAC, per ortho  FEN - regular diet VTE - Lovenox ID - Vanc/Zosyn     LOS: 6 days    Letha Cape , [facility] Surgery 11/24/2017, 10:12 AM Pager: [phone]

## 2017-11-24 NOTE — Progress Notes (Signed)
Patient ID: Stacey BelfastStacy Martinez, female   DOB: 12/30/1980, 37 y.o.   MRN: [id number] Patient states she has more pain in her foot today than yesterday.  There is no signs of swelling the skin is wrinkling well the [facility] is [facility].  We will continue with the installation therapy with plan for repeat irrigation debridement and wound closure on Wednesday.

## 2017-11-25 ENCOUNTER — Encounter (HOSPITAL_COMMUNITY): Payer: Self-pay | Admitting: Orthopedic Surgery

## 2017-11-25 LAB — CBC WITH DIFFERENTIAL/PLATELET
BASOS PCT: 0 %
Basophils Absolute: 0 10*3/uL (ref 0.0–0.1)
EOS PCT: 1 %
Eosinophils Absolute: 0.1 10*3/uL (ref 0.0–0.7)
HEMATOCRIT: 39.3 % (ref 36.0–46.0)
HEMOGLOBIN: 12.5 g/dL (ref 12.0–15.0)
LYMPHS PCT: 25 %
Lymphs Abs: 2.8 10*3/uL (ref 0.7–4.0)
MCH: 29.3 pg (ref 26.0–34.0)
MCHC: 31.8 g/dL (ref 30.0–36.0)
MCV: 92 fL (ref 78.0–100.0)
Monocytes Absolute: 0.6 10*3/uL (ref 0.1–1.0)
Monocytes Relative: 5 %
NEUTROS ABS: 7.7 10*3/uL (ref 1.7–7.7)
NEUTROS PCT: 69 %
Platelets: 518 10*3/uL — ABNORMAL HIGH (ref 150–400)
RBC: 4.27 MIL/uL (ref 3.87–5.11)
RDW: 14.4 % (ref 11.5–15.5)
WBC: 11.2 10*3/uL — ABNORMAL HIGH (ref 4.0–10.5)

## 2017-11-25 LAB — COMPREHENSIVE METABOLIC PANEL
ALK PHOS: 79 U/L (ref 38–126)
ALT: 33 U/L (ref 14–54)
AST: 26 U/L (ref 15–41)
Albumin: 2.1 g/dL — ABNORMAL LOW (ref 3.5–5.0)
Anion gap: 7 (ref 5–15)
BILIRUBIN TOTAL: 0.2 mg/dL — AB (ref 0.3–1.2)
BUN: 10 mg/dL (ref 6–20)
CALCIUM: 8.2 mg/dL — AB (ref 8.9–10.3)
CO2: 28 mmol/L (ref 22–32)
Chloride: 105 mmol/L (ref 101–111)
Creatinine, Ser: 0.73 mg/dL (ref 0.44–1.00)
Glucose, Bld: 101 mg/dL — ABNORMAL HIGH (ref 65–99)
Potassium: 4.2 mmol/L (ref 3.5–5.1)
Sodium: 140 mmol/L (ref 135–145)
TOTAL PROTEIN: 6.3 g/dL — AB (ref 6.5–8.1)

## 2017-11-25 LAB — PHOSPHORUS: PHOSPHORUS: 4.3 mg/dL (ref 2.5–4.6)

## 2017-11-25 LAB — MAGNESIUM: MAGNESIUM: 2.2 mg/dL (ref 1.7–2.4)

## 2017-11-25 NOTE — Progress Notes (Signed)
Physical Therapy Treatment Patient Details Name: Stacey Martinez MRN: [id number] DOB: 02-11-1981 Today's Date: 11/25/2017    History of Present Illness 37 y.o. female with history of IVDA (mostly cocaine, intermittently heroin)-presented with fever, worsening right foot/ankle pain, and erythema/swelling in a area in the lower part of her right neck. She was thought to have soft tissue infection/cellulitis-and started on vancomycin, however in spite of being on antimicrobial therapy for 48 hours, patient continued to have persistent fever, and worsening swelling of the right foot highly concerning for underlying septic arthritis and abscess in the lower part of right neck area. now s/p I&D of neck and R ankle. Plan to go to OR wed 4/17 for another I&D and wound closure.     PT Comments    Continuing work on functional mobility and activity tolerance;  Smoother steps today with RW; able to bear minimal weight through R toes, still painful R lower leg and foot; noted plan for return to OR Wed  Follow Up Recommendations  Home health PT;Supervision for mobility/OOB     Equipment Recommendations  Rolling walker with 5" wheels;Wheelchair (measurements PT) Financial planner for Other Services       Precautions / Restrictions Precautions Precautions: Fall Restrictions LUE Weight Bearing: Weight bearing as tolerated RLE Weight Bearing: Weight bearing as tolerated    Mobility  Bed Mobility Overal bed mobility: Modified Independent                Transfers Overall transfer level: Needs assistance Equipment used: Rolling walker (2 wheeled) Transfers: Sit to/from Stand Sit to Stand: Min assist         General transfer comment: Min assist to steady RW as she tends to pull up on RW with at least one hand; cues for hand placement and safety  Ambulation/Gait Ambulation/Gait assistance: Min guard Ambulation Distance (Feet): 20 Feet(to and from bathroom) Assistive device:  Rolling walker (2 wheeled) Gait Pattern/deviations: Antalgic Gait velocity: decreased   General Gait Details: Noting smoother gait with some minimal weight acceptance R foot; forefoot only; still too painful to get R heel to the floor; Stacey Martinez seemed pleased with smoother walking   Stairs             Wheelchair Mobility    Modified Rankin (Stroke Patients Only)       Balance Overall balance assessment: Mild deficits observed, not formally tested(due to R foot pain and L hip pain, patient reliant on RW )                                          Cognition Arousal/Alertness: Awake/alert Behavior During Therapy: WFL for tasks assessed/performed Overall Cognitive Status: Within Functional Limits for tasks assessed                                        Exercises      General Comments        Pertinent Vitals/Pain Pain Assessment: 0-10 Pain Score: 8  Pain Location: R foot Pain Descriptors / Indicators: Discomfort;Grimacing Pain Intervention(s): Monitored during session;Premedicated before session;Repositioned    Home Living                      Prior Function  PT Goals (current goals can now be found in the care plan section) Acute Rehab PT Goals Patient Stated Goal: Play with her son PT Goal Formulation: With patient/family Time For Goal Achievement: 11/30/17 Potential to Achieve Goals: Good Progress towards PT goals: Progressing toward goals    Frequency    Min 3X/week      PT Plan Current plan remains appropriate    Co-evaluation              AM-PAC PT "6 Clicks" Daily Activity  Outcome Measure  Difficulty turning over in bed (including adjusting bedclothes, sheets and blankets)?: None Difficulty moving from lying on back to sitting on the side of the bed? : None Difficulty sitting down on and standing up from a chair with arms (e.g., wheelchair, bedside commode, etc,.)?: None Help  needed moving to and from a bed to chair (including a wheelchair)?: A Little Help needed walking in hospital room?: A Little Help needed climbing 3-5 steps with a railing? : A Lot 6 Click Score: 20    End of Session Equipment Utilized During Treatment: Gait belt Activity Tolerance: Patient tolerated treatment well Patient left: in chair;with call bell/phone within reach Nurse Communication: Mobility status PT Visit Diagnosis: Pain;Unsteadiness on feet (R26.81);Other abnormalities of gait and mobility (R26.89) Pain - Right/Left: Right Pain - part of body: Ankle and joints of foot     Time: [id number] PT Time Calculation (min) (ACUTE ONLY): 21 min  Charges:  $Gait Training: 8-22 mins                    G Codes:       Van ClinesHolly Elda Martinez, PT  Acute Rehabilitation Services Pager [phone] Office [phone]    Stacey Martinez 11/25/2017, 4:30 PM

## 2017-11-25 NOTE — Plan of Care (Signed)
?  Problem: Clinical Measurements: ?Goal: Will remain free from infection ?Outcome: Progressing ?  ?

## 2017-11-25 NOTE — H&P (View-Only) (Signed)
Patient ID: Stacey BelfastStacy Martinez, female   DOB: 01/26/1981, 37 y.o.   MRN: [id number] Patient complains of pain with her foot dependent.  Plan for return to the operating room on Wednesday for repeat debridement of the right foot and anticipate wound closure.  Nursing to change the left forearm dressing today.

## 2017-11-25 NOTE — Progress Notes (Signed)
PROGRESS NOTE    Stacey Martinez  NID:[id number] DOB: Aug 11, 1981 DOA: 11/18/2017 PCP: Chapman Moss, MD   Brief Narrative:  Brief Narrative   37 y.o.femalewith history of IVDA (mostly cocaine, intermittently heroin)-presented with fever, worsening right foot/ankle pain, and erythema/swelling in a area in the lower part of her right neck. She was thought to have soft tissue infection/cellulitis-and started on vancomycin,however in spite of being on antimicrobial therapy for 48 hours,patient continuedto have persistent fever, and worsening swelling of the right foot highly concerning for underlying septic arthritis andabscess in the lower part of right neck area.  Patient was transferred to [facility] I&D, seen by Dr Sharol Given and it was done on 4/12 along with I and D of Neck Abscess by Dr. Rosendo Gros. She is POD 2 and complaining of pain.   Ethel Rana AM she was seen in bed resting; Nurse paged this Afternoon stating that when he was doing the dressing change, neck wound was profusely bleeding and stopped eventually after pressure was held for at least 5-10 minutes. General Surgery evaluated and felt no intervention was necessary.  Assessment & Plan:   Principal Problem:   Cellulitis Active Problems:   Leukocytosis   IVDU (intravenous drug user)   Anxiety and depression   Cellulitis of right ankle   Abscess of tendon sheath, right ankle and foot  Right Ankle/Foot cellulitis with concern for septic arthritis in the setting of IV drug usewith Abscess s/p I and D POD 3 -Vancomycin and Zosyn de-escalate now that Cx growing Strep Pyogenes; Discussed Case with ID Dr. Linus Salmons and recommended changing to Penicillin or Ampicillin; Recommended at least 3 week treatment and pull Midline and change to po prior to D/C given IVDU  -Afebrile at 98.3 -Orthopedics consulted and did Incision and Debridement on 4/12 plan to take the patient back to the OR on Wednesday for repeat debridement of the right foot  with anticipated wound closure.   -WBC improving and went from 26.1-> 11.2 -ESR was 82 -Blood Cultures showed No Growth at 5 Days -Transthoracic ECHO on 4/9 showed EF of 55-60%, no obvious vegetations -Right Foot Abscess Gram Stain showed Abundant WBC, Predominantly PMN with Moderate Gram Positive Cocci in Pairs and in Chains -Cx showed Few Group A Strep (Strep Pyogenes) and No Anaerobes Isolated but Cx in progress for 5 days. -Of not MRSA PCR Negative but did grow Staphylococcus Aures  -Pain Control with 2 mg IV Morphine q4hprn Severe Pain, po Oxycodone 10 mg po q4hprn Moderate Pain, Acetaminophen 650 mg RC q6hprn Mild Pain -C/w Gabapentin 600 mg po QID and C/w Methocarbamol 1500 mg po q6hprn Muscle Spasms -Bowel Regimen with Bisacodyl 10 mg RC Daily PRN moderate Constipation, Magnesium Citrate 1 bottle po Once PRN Sever Constipation, and Miralax 17 grams po Daily and Daily Prn -Antiemetics with po/IV Zofran 4 mg po q6hprn Nausea -Lost IV Access so now has a Midline   Right Upper chest wall, supraclavicular area abscess -CT soft tissue neck showed supraclavicularfossa abscesson the right measuring 3x5 cm.Intramuscular extension of abscess into the right sternocleidomastoid muscle with several loculations -Gen. surgery consulted and did I&D of the neck area at the same time Orthopedics did Ankle one  -Right Neck Abscess Gram Stain showed Abundant WBC Present, Predominantly PMN with few Gram Positive Cocci in Pairs and in Chains; Cx showed Rare Group A Strep (Strep Pyogenes) Isolated -Dr. Dema Severin of General Surgery recommending BID Dressing Changes to the neck with wet to Dry Kerlex -Abscess had Dressing changed  by nurse and started Bleeding; General Surgery evaluated continue wound care as directed.  Per surgery if she goes home with home health she will need an RN for nursing wound changes daily.  They are arranging a 2-week follow-up for wound check and there is no other further sternal  surgery intervention planned.  IVDU (Intravenous Drug User) and Polysubstance Abuse -Urine drug screen positive for Amphetamines and Benzodiazepines,Opiates,Cocaine, THC  -Continue pain control, with that she will not be provided narcotics on discharge -Patient counseled strongly forrecreational drugs cessation -Given Nicotine Patch 21 TD q24h -Hepatitis Panel Negative, HIV, and RPR Negative  -Has a Midline currently but will D/C as soon as Able and likely after her repeat incision and debridement on Wednesday  Anxiety and Depression -C/w Alprazolam 1 mg po BIDprn Anxiety, Fluoxetine 20 mg po Daily, and Quetiapine 25-50 mg po qHS, and Trazodone 150 mg po qHS  Thrombocytosis  -Likely Reactive  -Platelet Count trending up and went from 214 -> 423 -> 325 -> 303 -> 518 -Continue to Monitor for S/Sx of Bleeding -Repeat CBC in AM   Hyperglycemia in the setting of Prediabetes. -Patient's Glucose on BMP/CMP ranging from 101-120 in AM -Likely reactive  -Checked Hemoglobin A1c and it was 5.7  Normocytic Anemia -Hb/Hct went from 12.0/37.0 -> 11.4/36.0 -> 12.5/39.3 -Had bleeding from Neck Wound this Afternoon so will check CBC  -Continue to Monitor for S/Sx of Bleeding -Repeat CBC in AM   Hypocalcemia -Ca2+ was 8.2 on CMP -Check Ionized Calcium however is never drawn. -Repeat CMP in AM   DVT prophylaxis: Enoxaparin 40 mg sq q24h Code Status: FULL CODE Family Communication: No family present at bedside Disposition Plan: Remain Inpatient for continued workup and Treatment; Patient going back to OR on Wednesday for repeat I and D and Wound Closure; PT/OT recommending [place] OT she will need a rolling walker with 5 inch wheels in a wheelchair.  Consultants:   Orthopedic Surgery  General Surgery   Discussed Case with ID Dr. Linus Salmons   Procedures:  -Right Abscess Neck I and D done by Dr. Rosendo Gros -Right Ankle Debridement and Wound Vac Placement    Antimicrobials:    Anti-infectives (From admission, onward)   Start     Dose/Rate Route Frequency Ordered Stop   11/24/17 2100  ampicillin (OMNIPEN) 2 g in sodium chloride 0.9 % 100 mL IVPB     2 g 300 mL/hr over 20 Minutes Intravenous Every 4 hours 11/24/17 1622     11/24/17 1700  ampicillin (OMNIPEN) 2 g in sodium chloride 0.9 % 100 mL IVPB  Status:  Discontinued     2 g 300 mL/hr over 20 Minutes Intravenous Every 4 hours 11/24/17 1619 11/24/17 1622   11/23/17 2238  piperacillin-tazobactam (ZOSYN) IVPB 3.375 g  Status:  Discontinued     3.375 g 12.5 mL/hr over 240 Minutes Intravenous Every 8 hours 11/23/17 2238 11/24/17 1619   11/22/17 1000  ceFAZolin (ANCEF) IVPB 2g/100 mL premix     2 g 200 mL/hr over 30 Minutes Intravenous To Short Stay 11/22/17 0445 11/22/17 1203   11/21/17 1818  piperacillin-tazobactam (ZOSYN) IVPB 3.375 g  Status:  Discontinued     3.375 g 12.5 mL/hr over 240 Minutes Intravenous Every 8 hours 11/21/17 1205 11/23/17 2238   11/20/17 1600  vancomycin (VANCOCIN) IVPB 1000 mg/200 mL premix  Status:  Discontinued     1,000 mg 200 mL/hr over 60 Minutes Intravenous Every 12 hours 11/20/17 0755 11/24/17 1619   11/20/17  1400  piperacillin-tazobactam (ZOSYN) IVPB 3.375 g  Status:  Discontinued     3.375 g 12.5 mL/hr over 240 Minutes Intravenous Every 8 hours 11/20/17 1237 11/21/17 1205   11/19/17 0400  vancomycin (VANCOCIN) IVPB 750 mg/150 ml premix  Status:  Discontinued     750 mg 150 mL/hr over 60 Minutes Intravenous Every 12 hours 11/18/17 2207 11/20/17 0755   11/18/17 1800  vancomycin (VANCOCIN) IVPB 1000 mg/200 mL premix     1,000 mg 200 mL/hr over 60 Minutes Intravenous  Once 11/18/17 1758 11/18/17 2021     Subjective: Seen and examined at bedside.  Calm.  Complaining of right foot pain.  No chest pain or shortness of breath.  Wanting her pain medication to be increased.  Objective: Vitals:   11/24/17 1657 11/24/17 1958 11/25/17 0524 11/25/17 1426  BP: 118/60 113/61 112/64  124/73  Pulse: 75 79 67 82  Resp:  '16 16 17  ' Temp: 98.2 F (36.8 C) 99.1 F (37.3 C) 98.5 F (36.9 C) 98.1 F (36.7 C)  TempSrc: Oral Oral Oral Oral  SpO2: 98% 98% 97% 98%  Weight:      Height:        Intake/Output Summary (Last 24 hours) at 11/25/2017 1658 Last data filed at 11/25/2017 1027 Gross per 24 hour  Intake 820 ml  Output 125 ml  Net 695 ml   Filed Weights   11/18/17 2152 11/22/17 0946  Weight: 69 kg (152 lb 3.2 oz) 68.9 kg (152 lb)   Examination: Physical Exam:  Constitutional: Well-nourished, well-developed Caucasian female laying in bed complaining of pain again. Eyes: Sclerae Anicteric.  Lids and conjunctive are normal. ENMT:  External ears and nose appear normal.  Grossly normal hearing. Poor dentition Neck: Supple with no JVD.  Has a wound neck on the right side is covered.   Respiratory: Diminished to auscultation bilaterally however she has unlabored breathing.  No wheezing, rales, rhonchi. Cardiovascular: Regular rate and rhythm.  No lower extremity edema appreciated. Abdomen: Soft, nontender, nondistended.  Bowel sounds present in all 4 quadrants  GU: Deferred Musculoskeletal: Contractures or cyanosis.  Right arm has a midline in.  Right ankle is wrapped Skin: Had multiple tract marks injection sites from IV drug abuse on the hands and the forearms.  Right foot was wrapped in a wound VAC was connected to it.  Right neck wound was also wrapped and no evidence of any blood on the dressing. Neurologic: Cranial nerves II through XII grossly intact with no appreciable focal deficits warm and dry.   Psychiatric: Normal mood and affect and intact judgment and insight.  Awake and alert.  Data Reviewed: I have personally reviewed following labs and imaging studies  CBC: Recent Labs  Lab 11/18/17 1922  11/22/17 0629 11/23/17 0523 11/24/17 0437 11/24/17 1923 11/25/17 0657  WBC 22.8*   < > 14.4* 14.0* 12.1* 12.9* 11.2*  NEUTROABS 20.5*  --   --   --  8.6*  9.6* 7.7  HGB 14.7   < > 11.3* 12.0 11.4* 11.4* 12.5  HCT 44.2   < > 35.3* 37.0 36.0 35.4* 39.3  MCV 87.2   < > 90.5 89.4 90.5 89.4 92.0  PLT 214   < > 334 423* 325 303 518*   < > = values in this interval not displayed.   Basic Metabolic Panel: Recent Labs  Lab 11/19/17 0840 11/20/17 0438 11/23/17 0523 11/24/17 0437 11/25/17 0657  NA 134* 136 139 139 140  K 3.7 3.4*  3.9 3.8 4.2  CL 98* 100* 103 104 105  CO2 '25 26 27 25 28  ' GLUCOSE 120* 120* 106* 104* 101*  BUN 21* '12 11 12 10  ' CREATININE 0.66 0.67 0.57 0.71 0.73  CALCIUM 8.4* 8.0* 8.1* 7.9* 8.2*  MG  --   --   --  2.1 2.2  PHOS  --   --   --  4.3 4.3   GFR: Estimated Creatinine Clearance: 90.9 mL/min (by C-G formula based on SCr of 0.73 mg/dL). Liver Function Tests: Recent Labs  Lab 11/18/17 1749 11/23/17 0523 11/24/17 0437 11/25/17 0657  AST '22 23 30 26  ' ALT 18 26 32 33  ALKPHOS 84 90 74 79  BILITOT 0.6 0.2* 0.3 0.2*  PROT 7.4 5.9* 5.9* 6.3*  ALBUMIN 3.0* 1.8* 1.9* 2.1*   Recent Labs  Lab 11/18/17 1749  LIPASE 20   No results for input(s): AMMONIA in the last 168 hours. Coagulation Profile: No results for input(s): INR, PROTIME in the last 168 hours. Cardiac Enzymes: No results for input(s): CKTOTAL, CKMB, CKMBINDEX, TROPONINI in the last 168 hours. BNP (last 3 results) No results for input(s): PROBNP in the last 8760 hours. HbA1C: Recent Labs    11/24/17 0437  HGBA1C 5.7*   CBG: No results for input(s): GLUCAP in the last 168 hours. Lipid Profile: No results for input(s): CHOL, HDL, LDLCALC, TRIG, CHOLHDL, LDLDIRECT in the last 72 hours. Thyroid Function Tests: No results for input(s): TSH, T4TOTAL, FREET4, T3FREE, THYROIDAB in the last 72 hours. Anemia Panel: No results for input(s): VITAMINB12, FOLATE, FERRITIN, TIBC, IRON, RETICCTPCT in the last 72 hours. Sepsis Labs: Recent Labs  Lab 11/18/17 1904  LATICACIDVEN 1.48    Recent Results (from the past 240 hour(s))  Blood culture (routine  x 2)     Status: None   Collection Time: 11/18/17  7:04 PM  Result Value Ref Range Status   Specimen Description   Final    BLOOD LEFT ARM Performed at [facility], [place] [address], [place], [place] [address]    Special Requests   Final    BOTTLES DRAWN AEROBIC AND ANAEROBIC Blood Culture adequate volume Performed at [facility] [address] [place], [place] [address]    Culture   Final    NO GROWTH 5 DAYS Performed at [facility], [place] [address], [place], [place] [address]    Report Status 11/23/2017 FINAL  Final  Blood culture (routine x 2)     Status: None   Collection Time: 11/18/17  7:04 PM  Result Value Ref Range Status   Specimen Description   Final    BLOOD LEFT FOREARM Performed at [facility] [address] [place], [place] [address]    Special Requests   Final    BOTTLES DRAWN AEROBIC AND ANAEROBIC Blood Culture adequate volume Performed at [facility] [address] [place], [place] [address]    Culture   Final    NO GROWTH 5 DAYS Performed at [facility], [place] [address], [place], [place] [address]    Report Status 11/23/2017 FINAL  Final  Surgical pcr screen     Status: Abnormal   Collection Time: 11/20/17 11:20 PM  Result Value Ref Range Status   MRSA, PCR NEGATIVE NEGATIVE Final   Staphylococcus aureus POSITIVE (A) NEGATIVE Final    Comment: (NOTE) The Xpert SA Assay (FDA approved for NASAL specimens in patients 22 years of  age and older), is one component of a comprehensive surveillance program. It is not intended to diagnose infection nor to guide or monitor treatment. Performed at [facility], [place] [address], [place], [place] [address]   Aerobic/Anaerobic Culture (surgical/deep wound)     Status: None (Preliminary result)   Collection Time: 11/22/17 12:13 PM  Result Value Ref Range Status   Specimen Description FOOT RIGHT ABSCESS  Final    Special Requests NONE  Final   Gram Stain   Final    ABUNDANT WBC PRESENT, PREDOMINANTLY PMN MODERATE GRAM POSITIVE COCCI IN PAIRS IN CHAINS Performed at [facility], [place] [address] [place], [place] [address]    Culture   Final    FEW GROUP A STREP (S.PYOGENES) ISOLATED NO ANAEROBES ISOLATED; CULTURE IN PROGRESS FOR 5 DAYS    Report Status PENDING  Incomplete  Aerobic/Anaerobic Culture (surgical/deep wound)     Status: None (Preliminary result)   Collection Time: 11/22/17 12:14 PM  Result Value Ref Range Status   Specimen Description FOOT RIGHT NECK  Final   Special Requests NONE  Final   Gram Stain   Final    ABUNDANT WBC PRESENT, PREDOMINANTLY PMN FEW GRAM POSITIVE COCCI IN PAIRS IN CHAINS Performed at [facility], [place] [address], [place], [place] [address]    Culture   Final    RARE GROUP A STREP (S.PYOGENES) ISOLATED NO ANAEROBES ISOLATED; CULTURE IN PROGRESS FOR 5 DAYS    Report Status PENDING  Incomplete   Radiology Studies: No results found.  Scheduled Meds: . Chlorhexidine Gluconate Cloth  6 each Topical Daily  . enoxaparin (LOVENOX) injection  40 mg Subcutaneous Q24H  . feeding supplement (ENSURE ENLIVE)  237 mL Oral BID BM  . FLUoxetine  20 mg Oral Daily  . gabapentin  600 mg Oral QID  . mouth rinse  15 mL Mouth Rinse BID  . multivitamin with minerals  1 tablet Oral Daily  . mupirocin ointment  1 application Nasal BID  . mupirocin ointment   Topical Daily  . nicotine  21 mg Transdermal Daily  . polyethylene glycol  17 g Oral Daily  . QUEtiapine  25-50 mg Oral QHS  . senna  1 tablet Oral BID  . traZODone  150 mg Oral QHS   Continuous Infusions: . sodium chloride 75 mL/hr at 11/22/17 1400  . sodium chloride 10 mL/hr at 11/22/17 1714  . ampicillin (OMNIPEN) IV Stopped (11/25/17 1527)  . lactated ringers Stopped (11/22/17 1400)    LOS: 7 days   Kerney Elbe, DO [facility] Pager [phone]  If 7PM-7AM, please contact  night-coverage [email or web address] Password [facility] 11/25/2017, 4:58 PM

## 2017-11-25 NOTE — Progress Notes (Addendum)
Patient ID: Stacey Martinez, female   DOB: 1981/08/13, 37 y.o.   MRN: [id number]    3 Days Post-Op  Subjective: Patient tolerating dressing changes, but still very painful.    Objective: Vital signs in last 24 hours: Temp:  [98.2 F (36.8 C)-99.1 F (37.3 C)] 98.5 F (36.9 C) (04/15 0524) Pulse Rate:  [67-79] 67 (04/15 0524) Resp:  [16] 16 (04/15 0524) BP: (112-118)/(60-64) 112/64 (04/15 0524) SpO2:  [97 %-98 %] 97 % (04/15 0524) Last BM Date: 11/23/17  Intake/Output from previous day: 04/14 0701 - 04/15 0700 In: 1080 [P.O.:1080] Out: 125 [Drains:125] Intake/Output this shift: No intake/output data recorded.  PE: Neck: erythema continues to improve.  Wound is clean and packed.  Some bleeding during dressing change, but expect this as this is a sign of healthy good tissue.  No further purulent drainage  Lab Results:  Recent Labs    11/24/17 1923 11/25/17 0657  WBC 12.9* 11.2*  HGB 11.4* 12.5  HCT 35.4* 39.3  PLT 303 518*   BMET Recent Labs    11/24/17 0437 11/25/17 0657  NA 139 140  K 3.8 4.2  CL 104 105  CO2 25 28  GLUCOSE 104* 101*  BUN 12 10  CREATININE 0.71 0.73  CALCIUM 7.9* 8.2*   PT/INR No results for input(s): LABPROT, INR in the last 72 hours. CMP     Component Value Date/Time   NA 140 11/25/2017 0657   K 4.2 11/25/2017 0657   CL 105 11/25/2017 0657   CO2 28 11/25/2017 0657   GLUCOSE 101 (H) 11/25/2017 0657   BUN 10 11/25/2017 0657   CREATININE 0.73 11/25/2017 0657   CALCIUM 8.2 (L) 11/25/2017 0657   PROT 6.3 (L) 11/25/2017 0657   ALBUMIN 2.1 (L) 11/25/2017 0657   AST 26 11/25/2017 0657   ALT 33 11/25/2017 0657   ALKPHOS 79 11/25/2017 0657   BILITOT 0.2 (L) 11/25/2017 0657   GFRNONAA >60 11/25/2017 0657   GFRAA >60 11/25/2017 0657   Lipase     Component Value Date/Time   LIPASE 20 11/18/2017 1749       Studies/Results: No results found.  Anti-infectives: Anti-infectives (From admission, onward)   Start     Dose/Rate Route  Frequency Ordered Stop   11/24/17 2100  ampicillin (OMNIPEN) 2 g in sodium chloride 0.9 % 100 mL IVPB     2 g 300 mL/hr over 20 Minutes Intravenous Every 4 hours 11/24/17 1622     11/24/17 1700  ampicillin (OMNIPEN) 2 g in sodium chloride 0.9 % 100 mL IVPB  Status:  Discontinued     2 g 300 mL/hr over 20 Minutes Intravenous Every 4 hours 11/24/17 1619 11/24/17 1622   11/23/17 2238  piperacillin-tazobactam (ZOSYN) IVPB 3.375 g  Status:  Discontinued     3.375 g 12.5 mL/hr over 240 Minutes Intravenous Every 8 hours 11/23/17 2238 11/24/17 1619   11/22/17 1000  ceFAZolin (ANCEF) IVPB 2g/100 mL premix     2 g 200 mL/hr over 30 Minutes Intravenous To Short Stay 11/22/17 0445 11/22/17 1203   11/21/17 1818  piperacillin-tazobactam (ZOSYN) IVPB 3.375 g  Status:  Discontinued     3.375 g 12.5 mL/hr over 240 Minutes Intravenous Every 8 hours 11/21/17 1205 11/23/17 2238   11/20/17 1600  vancomycin (VANCOCIN) IVPB 1000 mg/200 mL premix  Status:  Discontinued     1,000 mg 200 mL/hr over 60 Minutes Intravenous Every 12 hours 11/20/17 0755 11/24/17 1619   11/20/17 1400  piperacillin-tazobactam (  ZOSYN) IVPB 3.375 g  Status:  Discontinued     3.375 g 12.5 mL/hr over 240 Minutes Intravenous Every 8 hours 11/20/17 1237 11/21/17 1205   11/19/17 0400  vancomycin (VANCOCIN) IVPB 750 mg/150 ml premix  Status:  Discontinued     750 mg 150 mL/hr over 60 Minutes Intravenous Every 12 hours 11/18/17 2207 11/20/17 0755   11/18/17 1800  vancomycin (VANCOCIN) IVPB 1000 mg/200 mL premix     1,000 mg 200 mL/hr over 60 Minutes Intravenous  Once 11/18/17 1758 11/18/17 2021       Assessment/Plan right neck abscess, POD 3, s/p I&D -NS WD dressing changes BID -CX :Group A strep -on vanc and zosyn -will arrange for wound check follow up in our office in 2 weeks.  Wound is clean and otherwise no further general surgery interventions planned. -if she goes home she will need HH, RN for NS WD dressing changes daily -we  will sign off.  Please call with questions  Right ankle abscess -s/p I&D with wound VAC, per ortho  FEN -regular diet VTE -Lovenox ID -Vanc/Zosyn    LOS: 7 days    Letha CapeKelly E Haya Hemler , [facility] 11/25/2017, 9:35 AM Pager: [phone]

## 2017-11-25 NOTE — Progress Notes (Signed)
Patient ID: Stacey Martinez, female   DOB: 03/12/1981, 36 y.o.   MRN: [id number] Patient complains of pain with her foot dependent.  Plan for return to the operating room on Wednesday for repeat debridement of the right foot and anticipate wound closure.  Nursing to change the left forearm dressing today. 

## 2017-11-26 ENCOUNTER — Other Ambulatory Visit (INDEPENDENT_AMBULATORY_CARE_PROVIDER_SITE_OTHER): Payer: Self-pay | Admitting: Orthopedic Surgery

## 2017-11-26 DIAGNOSIS — L02611 Cutaneous abscess of right foot: Secondary | ICD-10-CM

## 2017-11-26 LAB — COMPREHENSIVE METABOLIC PANEL
ALT: 28 U/L (ref 14–54)
AST: 20 U/L (ref 15–41)
Albumin: 2 g/dL — ABNORMAL LOW (ref 3.5–5.0)
Alkaline Phosphatase: 74 U/L (ref 38–126)
Anion gap: 8 (ref 5–15)
BUN: 10 mg/dL (ref 6–20)
CHLORIDE: 105 mmol/L (ref 101–111)
CO2: 26 mmol/L (ref 22–32)
CREATININE: 0.74 mg/dL (ref 0.44–1.00)
Calcium: 8.2 mg/dL — ABNORMAL LOW (ref 8.9–10.3)
Glucose, Bld: 98 mg/dL (ref 65–99)
POTASSIUM: 4 mmol/L (ref 3.5–5.1)
SODIUM: 139 mmol/L (ref 135–145)
Total Bilirubin: 0.3 mg/dL (ref 0.3–1.2)
Total Protein: 5.9 g/dL — ABNORMAL LOW (ref 6.5–8.1)

## 2017-11-26 LAB — CBC WITH DIFFERENTIAL/PLATELET
BASOS PCT: 0 %
Band Neutrophils: 1 %
Basophils Absolute: 0 10*3/uL (ref 0.0–0.1)
Blasts: 0 %
EOS PCT: 3 %
Eosinophils Absolute: 0.4 10*3/uL (ref 0.0–0.7)
HCT: 35.7 % — ABNORMAL LOW (ref 36.0–46.0)
Hemoglobin: 11.1 g/dL — ABNORMAL LOW (ref 12.0–15.0)
LYMPHS ABS: 2.5 10*3/uL (ref 0.7–4.0)
Lymphocytes Relative: 20 %
MCH: 28.3 pg (ref 26.0–34.0)
MCHC: 31.1 g/dL (ref 30.0–36.0)
MCV: 91.1 fL (ref 78.0–100.0)
METAMYELOCYTES PCT: 9 %
MONO ABS: 0.5 10*3/uL (ref 0.1–1.0)
MONOS PCT: 4 %
MYELOCYTES: 0 %
NEUTROS ABS: 9 10*3/uL — AB (ref 1.7–7.7)
NEUTROS PCT: 63 %
NRBC: 0 /100{WBCs}
Other: 0 %
PLATELETS: 519 10*3/uL — AB (ref 150–400)
Promyelocytes Relative: 0 %
RBC: 3.92 MIL/uL (ref 3.87–5.11)
RDW: 14.2 % (ref 11.5–15.5)
WBC: 12.4 10*3/uL — AB (ref 4.0–10.5)

## 2017-11-26 LAB — MAGNESIUM: MAGNESIUM: 2.2 mg/dL (ref 1.7–2.4)

## 2017-11-26 LAB — PHOSPHORUS: PHOSPHORUS: 4.4 mg/dL (ref 2.5–4.6)

## 2017-11-26 MED ORDER — KETOROLAC TROMETHAMINE 15 MG/ML IJ SOLN
15.0000 mg | Freq: Four times a day (QID) | INTRAMUSCULAR | Status: AC | PRN
  Administered 2017-11-26 – 2017-11-27 (×4): 15 mg via INTRAVENOUS
  Filled 2017-11-26 (×4): qty 1

## 2017-11-26 MED ORDER — CEFAZOLIN SODIUM-DEXTROSE 2-4 GM/100ML-% IV SOLN
2.0000 g | INTRAVENOUS | Status: AC
  Administered 2017-11-27: 2 g via INTRAVENOUS
  Filled 2017-11-26 (×2): qty 100

## 2017-11-26 NOTE — Progress Notes (Signed)
Physical Therapy Treatment Patient Details Name: Stacey Martinez MRN: [id number] DOB: 08-21-1980 Today's Date: 11/26/2017    History of Present Illness 37 y.o. female with history of IVDA (mostly cocaine, intermittently heroin)-presented with fever, worsening right foot/ankle pain, and erythema/swelling in a area in the lower part of her right neck. She was thought to have soft tissue infection/cellulitis-and started on vancomycin, however in spite of being on antimicrobial therapy for 48 hours, patient continued to have persistent fever, and worsening swelling of the right foot highly concerning for underlying septic arthritis and abscess in the lower part of right neck area. now s/p I&D of neck and R ankle. Plan to go to OR wed 4/17 for another I&D and wound closure.     PT Comments    Pt received in bed c/o pain and not willing to try gait.  Educated pt on benefits of mobility for pain/healing and pt agreed to do therapeutic exercise.  She completed exercises in bed w/o complaint and good technique.  Pt still unwilling to try gait after exercise so she was educated on mobility again.  Recommend continuing POC to improve pts functional mobility and activity tolerance.  Noted plan for return to OR Wed.     Follow Up Recommendations  Home health PT;Supervision for mobility/OOB     Equipment Recommendations  Rolling walker with 5" wheels;Wheelchair (measurements PT)    Recommendations for Other Services OT consult     Precautions / Restrictions Precautions Precautions: Fall Restrictions Weight Bearing Restrictions: Yes LUE Weight Bearing: Weight bearing as tolerated RLE Weight Bearing: Weight bearing as tolerated Other Position/Activity Restrictions: WBAT    Mobility  Bed Mobility                  Transfers                    Ambulation/Gait                 Stairs             Wheelchair Mobility    Modified Rankin (Stroke Patients Only)        Balance                                            Cognition Arousal/Alertness: Awake/alert Behavior During Therapy: WFL for tasks assessed/performed Overall Cognitive Status: Within Functional Limits for tasks assessed                                        Exercises General Exercises - Lower Extremity Ankle Circles/Pumps: AROM;20 reps;Supine;Right;Other (comment)(Ankle Pumps 20x2/Ankle Cirles 20x2) Hip ABduction/ADduction: AROM;10 reps;Both;Supine Straight Leg Raises: 10 reps;AROM;Both;Supine Other Exercises Other Exercises: Strecthing/supine/achilles tendon with towel/x3    General Comments        Pertinent Vitals/Pain Pain Assessment: Faces Pain Score: 8  Pain Location: R foot Pain Descriptors / Indicators: Discomfort;Grimacing    Home Living                      Prior Function            PT Goals (current goals can now be found in the care plan section) Acute Rehab PT Goals Patient Stated Goal: Play with her son PT Goal Formulation: With patient/family  Time For Goal Achievement: 11/30/17 Potential to Achieve Goals: Good Progress towards PT goals: Progressing toward goals    Frequency    Min 3X/week      PT Plan Current plan remains appropriate    Co-evaluation              AM-PAC PT "6 Clicks" Daily Activity  Outcome Measure  Difficulty turning over in bed (including adjusting bedclothes, sheets and blankets)?: None Difficulty moving from lying on back to sitting on the side of the bed? : None Difficulty sitting down on and standing up from a chair with arms (e.g., wheelchair, bedside commode, etc,.)?: None       6 Click Score: 12    End of Session Equipment Utilized During Treatment: Other (comment) Activity Tolerance: Patient tolerated treatment well Patient left: in bed;with call bell/phone within reach Nurse Communication: Mobility status PT Visit Diagnosis: Pain;Unsteadiness on  feet (R26.81);Other abnormalities of gait and mobility (R26.89) Pain - Right/Left: Right Pain - part of body: Ankle and joints of foot     Time: [id number] PT Time Calculation (min) (ACUTE ONLY): 23 min  Charges:  $Therapeutic Exercise: 23-37 mins                    G Codes:       Rosann AuerbachCraig Coutney Wildermuth, SPTA [phone]    Rosann Auerbachraig Twala Collings 11/26/2017, 3:27 PM

## 2017-11-26 NOTE — Progress Notes (Signed)
PROGRESS NOTE    Stacey Martinez  IRJ:[id number] DOB: 1980-09-20 DOA: 11/18/2017 PCP: Chapman Moss, MD   Brief Narrative:  Brief Narrative   37 y.o.femalewith history of IVDA (mostly cocaine, intermittently heroin)-presented with fever, worsening right foot/ankle pain, and erythema/swelling in a area in the lower part of her right neck. She was thought to have soft tissue infection/cellulitis-and started on vancomycin,however in spite of being on antimicrobial therapy for 48 hours,patient continuedto have persistent fever, and worsening swelling of the right foot highly concerning for underlying septic arthritis andabscess in the lower part of right neck area.  Patient was transferred to [facility] I&D, seen by Dr Sharol Given and it was done on 4/12 along with I and D of Neck Abscess by Dr. Rosendo Gros.   Ethel Rana AM she was seen in bed resting; Nurse paged this Afternoon stating that when he was doing the dressing change, neck wound was profusely bleeding and stopped eventually after pressure was held for at least 5-10 minutes. General Surgery evaluated and felt no intervention was necessary.  Patient is awaiting repeat I&D and wound closure of the right ankle to be done Wednesday 4/17.  This a.m. patient was complaining of intractable pain so IV ketorolac was added to her pain regimen.  Assessment & Plan:   Principal Problem:   Cellulitis Active Problems:   Leukocytosis   IVDU (intravenous drug user)   Anxiety and depression   Cellulitis of right ankle   Abscess of tendon sheath, right ankle and foot  Right Ankle/Foot cellulitis with concern for septic arthritis in the setting of IV drug usewith Abscess s/p I and D POD 4 -Vancomycin and Zosyn de-escalate now that Cx growing Strep Pyogenes; Discussed Case with ID Dr. Linus Salmons and recommended changing to Penicillin or Ampicillin; Recommended at least 3 week treatment and pull Midline and change to po prior to D/C given IVDU  -Afebrile at  98.3 -Orthopedics consulted and did Incision and Debridement on 4/12 plan to take the patient back to the OR on Wednesday for repeat debridement of the right foot with anticipated wound closure.   -WBC improving and went from 26.1-> 11.2 -> 12.4 -ESR was 82 -Blood Cultures showed No Growth at 5 Days -Transthoracic ECHO on 4/9 showed EF of 55-60%, no obvious vegetations -Right Foot Abscess Gram Stain showed Abundant WBC, Predominantly PMN with Moderate Gram Positive Cocci in Pairs and in Chains -Cx showed Few Group A Strep (Strep Pyogenes) and No Anaerobes Isolated but Cx in progress for 5 days. -Of not MRSA PCR Negative but did grow Staphylococcus Aures  -Pain Control with 2 mg IV Morphine q4hprn Severe Pain, po Oxycodone 10 mg po q4hprn Moderate Pain, Acetaminophen 650 mg RC q6hprn Mild Pain; Added IV Ketorolac 15 mg IV every 6 as needed for moderate pain x4 doses today -C/w Gabapentin 600 mg po QID and C/w Methocarbamol 1500 mg po q6hprn Muscle Spasms -Bowel Regimen with Bisacodyl 10 mg RC Daily PRN moderate Constipation, Magnesium Citrate 1 bottle po Once PRN Sever Constipation, and Miralax 17 grams po Daily and Daily Prn -Antiemetics with po/IV Zofran 4 mg po q6hprn Nausea -Lost IV Access so now has a Midline   Right Upper chest wall, supraclavicular area abscess -CT soft tissue neck showed supraclavicularfossa abscesson the right measuring 3x5 cm.Intramuscular extension of abscess into the right sternocleidomastoid muscle with several loculations -Gen. surgery consulted and did I&D of the neck area at the same time Orthopedics did Ankle one  -Right Neck Abscess Gram Stain  showed Abundant WBC Present, Predominantly PMN with few Gram Positive Cocci in Pairs and in Chains; Cx showed Rare Group A Strep (Strep Pyogenes) Isolated -Dr. Dema Severin of General Surgery recommending BID Dressing Changes to the neck with wet to Dry Kerlex -Abscess had Dressing changed by nurse and started Bleeding;  General Surgery evaluated continue wound care as directed.  Per surgery if she goes home with home health she will need an RN for nursing wound changes daily.  They are arranging a 2-week follow-up for wound check and there is no other further sternal surgery intervention planned.  IVDU (Intravenous Drug User) and Polysubstance Abuse -Urine drug screen positive for Amphetamines and Benzodiazepines,Opiates,Cocaine, THC  -Continue pain control, with that she will not be provided narcotics on discharge -Patient counseled strongly forrecreational drugs cessation -Given Nicotine Patch 21 TD q24h -Hepatitis Panel Negative, HIV, and RPR Negative  -Has a Midline currently but will D/C as soon as Able and likely after her repeat incision and debridement on Wednesday  Anxiety and Depression -C/w Alprazolam 1 mg po BIDprn Anxiety, Fluoxetine 20 mg po Daily, and Quetiapine 25-50 mg po qHS, and Trazodone 150 mg po qHS  Thrombocytosis  -Likely Reactive and possibly antibiotic mediated -Platelet Count trending up and went from 214 -> 423 -> 325 -> 303 -> 518 -> 519 -Continue to Monitor for S/Sx of Bleeding -Repeat CBC in AM   Hyperglycemia in the setting of Prediabetes. -Patient's Glucose on BMP/CMP ranging from 98-120 in AM -Likely reactive  -Checked Hemoglobin A1c and it was 5.7  Normocytic Anemia -Hb/Hct went from 12.0/37.0 -> 11.4/36.0 -> 12.5/39.3 -> 11.1/35.7 -Had bleeding from Neck Wound a few days ago but now is stable  -Continue to Monitor for S/Sx of Bleeding as patient is being placed on IV Ketorolac -Repeat CBC in AM   Hypocalcemia -Ca2+ was 8.2 on CMP -Check Ionized Calcium however is never drawn. -Repeat CMP in AM   DVT prophylaxis: Enoxaparin 40 mg sq q24h Code Status: FULL CODE Family Communication: No family present at bedside Disposition Plan: Remain Inpatient for continued workup and Treatment; Patient going back to OR on Wednesday 4/17 for repeat I and D and Wound  Closure; PT/OT recommending [place] OT she will need a rolling walker with 5 inch wheels in a wheelchair.  Consultants:   Orthopedic Surgery  General Surgery   Discussed Case with ID Dr. Linus Salmons   Procedures:  -Right Abscess Neck I and D done by Dr. Rosendo Gros -Right Ankle Debridement and Wound Vac Placement    Antimicrobials:  Anti-infectives (From admission, onward)   Start     Dose/Rate Route Frequency Ordered Stop   11/24/17 2100  ampicillin (OMNIPEN) 2 g in sodium chloride 0.9 % 100 mL IVPB     2 g 300 mL/hr over 20 Minutes Intravenous Every 4 hours 11/24/17 1622     11/24/17 1700  ampicillin (OMNIPEN) 2 g in sodium chloride 0.9 % 100 mL IVPB  Status:  Discontinued     2 g 300 mL/hr over 20 Minutes Intravenous Every 4 hours 11/24/17 1619 11/24/17 1622   11/23/17 2238  piperacillin-tazobactam (ZOSYN) IVPB 3.375 g  Status:  Discontinued     3.375 g 12.5 mL/hr over 240 Minutes Intravenous Every 8 hours 11/23/17 2238 11/24/17 1619   11/22/17 1000  ceFAZolin (ANCEF) IVPB 2g/100 mL premix     2 g 200 mL/hr over 30 Minutes Intravenous To Short Stay 11/22/17 0445 11/22/17 1203   11/21/17 1818  piperacillin-tazobactam (ZOSYN) IVPB  3.375 g  Status:  Discontinued     3.375 g 12.5 mL/hr over 240 Minutes Intravenous Every 8 hours 11/21/17 1205 11/23/17 2238   11/20/17 1600  vancomycin (VANCOCIN) IVPB 1000 mg/200 mL premix  Status:  Discontinued     1,000 mg 200 mL/hr over 60 Minutes Intravenous Every 12 hours 11/20/17 0755 11/24/17 1619   11/20/17 1400  piperacillin-tazobactam (ZOSYN) IVPB 3.375 g  Status:  Discontinued     3.375 g 12.5 mL/hr over 240 Minutes Intravenous Every 8 hours 11/20/17 1237 11/21/17 1205   11/19/17 0400  vancomycin (VANCOCIN) IVPB 750 mg/150 ml premix  Status:  Discontinued     750 mg 150 mL/hr over 60 Minutes Intravenous Every 12 hours 11/18/17 2207 11/20/17 0755   11/18/17 1800  vancomycin (VANCOCIN) IVPB 1000 mg/200 mL premix     1,000 mg 200 mL/hr over  60 Minutes Intravenous  Once 11/18/17 1758 11/18/17 2021     Subjective: Seen and examined at bedside and states that she had intractable pain.  States that she is hurting worse from yesterday evening.  No nausea, no vomiting, no lightheadedness or dizziness. Understand that she will go to surgery tomorrow.  Objective: Vitals:   11/25/17 1946 11/25/17 2000 11/25/17 2157 11/26/17 0535  BP:   122/71 113/73  Pulse: 93 93 94   Resp:  _0 Temp:   98.3 F (36.8 C) 98.2 F (36.8 C)  TempSrc:    Oral  SpO2: 100% 98% 98% 100%  Weight:      Height:        Intake/Output Summary (Last 24 hours) at 11/26/2017 0835 Last data filed at 11/26/2017 0550 Gross per 24 hour  Intake 580 ml  Output 50 ml  Net 530 ml   Filed Weights   11/18/17 2152 11/22/17 0946  Weight: 69 kg (152 lb 3.2 oz) 68.9 kg (152 lb)   Examination: Physical Exam:  Constitutional: Well-nourished, well-developed Caucasian female in no acute distress however she is complaining of significant pain. Eyes: Sclera anicteric.  Lids and conjunctive are normal. ENMT: External ears and nose appear normal.  Grossly normal hearing.  Has poor dentition Neck: Supple with no JVD.  Has a wound on Right side of neck Neck that is covered Respiratory: Diminished to auscultation bilaterally, however she has unlabored breathing.  No appreciable wheezing, rales, rhonchi. Cardiovascular: Regular rate and rhythm. No lower extremity edema appreciated Abdomen: Soft, nontender, nondistended.  Bowel sounds present in all 4 quadrants. GU: Deferred Musculoskeletal: No contractures, no cyanosis.  Has a right arm midline.  Right ankle is wrapped with a wound VAC connected to it Skin: Warm and dry, has multiple track marks injections from IV drug abuse in the hands and forearms.  Also has a wound that was incised and drained in the OR on the left arm.  Right foot was wrapped with wound VAC connected to it. The right neck was dressed and also wrapped  with no evidence of any blood in the dressing Neurologic: Cranial nerves II through XII grossly intact.  No appreciable focal deficits. Psychiatric: Normal mood and anxious affect.  Awake and alert.  Intact judgment and insight  Data Reviewed: I have personally reviewed following labs and imaging studies  CBC: Recent Labs  Lab 11/23/17 0523 11/24/17 0437 11/24/17 1923 11/25/17 0657 11/26/17 0527  WBC 14.0* 12.1* 12.9* 11.2* 12.4*  NEUTROABS  --  8.6* 9.6* 7.7 9.0*  HGB 12.0 11.4* 11.4* 12.5 11.1*  HCT 37.0 36.0 35.4*  39.3 35.7*  MCV 89.4 90.5 89.4 92.0 91.1  PLT 423* 325 303 518* 694*   Basic Metabolic Panel: Recent Labs  Lab 11/20/17 0438 11/23/17 0523 11/24/17 0437 11/25/17 0657 11/26/17 0527  NA 136 139 139 140 139  K 3.4* 3.9 3.8 4.2 4.0  CL 100* 103 104 105 105  CO2 _0 GLUCOSE 120* 106* 104* 101* 98  BUN _1 CREATININE 0.67 0.57 0.71 0.73 0.74  CALCIUM 8.0* 8.1* 7.9* 8.2* 8.2*  MG  --   --  2.1 2.2 2.2  PHOS  --   --  4.3 4.3 4.4   GFR: Estimated Creatinine Clearance: 90.9 mL/min (by C-G formula based on SCr of 0.74 mg/dL). Liver Function Tests: Recent Labs  Lab 11/23/17 0523 11/24/17 0437 11/25/17 0657 11/26/17 0527  AST _2 ALT 26 32 33 28  ALKPHOS 90 74 79 74  BILITOT 0.2* 0.3 0.2* 0.3  PROT 5.9* 5.9* 6.3* 5.9*  ALBUMIN 1.8* 1.9* 2.1* 2.0*   No results for input(s): LIPASE, AMYLASE in the last 168 hours. No results for input(s): AMMONIA in the last 168 hours. Coagulation Profile: No results for input(s): INR, PROTIME in the last 168 hours. Cardiac Enzymes: No results for input(s): CKTOTAL, CKMB, CKMBINDEX, TROPONINI in the last 168 hours. BNP (last 3 results) No results for input(s): PROBNP in the last 8760 hours. HbA1C: Recent Labs    11/24/17 0437  HGBA1C 5.7*   CBG: No results for input(s): GLUCAP in the last 168 hours. Lipid Profile: No results for input(s): CHOL, HDL, LDLCALC, TRIG, CHOLHDL,  LDLDIRECT in the last 72 hours. Thyroid Function Tests: No results for input(s): TSH, T4TOTAL, FREET4, T3FREE, THYROIDAB in the last 72 hours. Anemia Panel: No results for input(s): VITAMINB12, FOLATE, FERRITIN, TIBC, IRON, RETICCTPCT in the last 72 hours. Sepsis Labs: No results for input(s): PROCALCITON, LATICACIDVEN in the last 168 hours.  Recent Results (from the past 240 hour(s))  Blood culture (routine x 2)     Status: None   Collection Time: 11/18/17  7:04 PM  Result Value Ref Range Status   Specimen Description   Final    BLOOD LEFT ARM Performed at [facility] [address], [place], [place] [address]    Special Requests   Final    BOTTLES DRAWN AEROBIC AND ANAEROBIC Blood Culture adequate volume Performed at [facility] [address], [place], [place] [address]    Culture   Final    NO GROWTH 5 DAYS Performed at [facility], [place] [address] [place], [place] [address]    Report Status 11/23/2017 FINAL  Final  Blood culture (routine x 2)     Status: None   Collection Time: 11/18/17  7:04 PM  Result Value Ref Range Status   Specimen Description   Final    BLOOD LEFT FOREARM Performed at [facility] [address], [place], [place] [address]    Special Requests   Final    BOTTLES DRAWN AEROBIC AND ANAEROBIC Blood Culture adequate volume Performed at [facility] [address] [place], [place] [address]    Culture   Final    NO GROWTH 5 DAYS Performed at [facility], [place] [address] [place], [place] [address]    Report Status 11/23/2017 FINAL  Final  Surgical pcr screen     Status: Abnormal   Collection Time: 11/20/17  11:20 PM  Result Value Ref Range Status   MRSA, PCR NEGATIVE NEGATIVE Final   Staphylococcus aureus POSITIVE (A) NEGATIVE Final    Comment: (NOTE) The Xpert SA Assay (FDA approved for NASAL specimens in patients 14 years of age and older), is one  component of a comprehensive surveillance program. It is not intended to diagnose infection nor to guide or monitor treatment. Performed at [facility], [place] [address] [place], [place] [address]   Aerobic/Anaerobic Culture (surgical/deep wound)     Status: None (Preliminary result)   Collection Time: 11/22/17 12:13 PM  Result Value Ref Range Status   Specimen Description FOOT RIGHT ABSCESS  Final   Special Requests NONE  Final   Gram Stain   Final    ABUNDANT WBC PRESENT, PREDOMINANTLY PMN MODERATE GRAM POSITIVE COCCI IN PAIRS IN CHAINS Performed at [facility], [place] [address] [place], [place] [address]    Culture   Final    FEW GROUP A STREP (S.PYOGENES) ISOLATED NO ANAEROBES ISOLATED; CULTURE IN PROGRESS FOR 5 DAYS    Report Status PENDING  Incomplete  Aerobic/Anaerobic Culture (surgical/deep wound)     Status: None (Preliminary result)   Collection Time: 11/22/17 12:14 PM  Result Value Ref Range Status   Specimen Description FOOT RIGHT NECK  Final   Special Requests NONE  Final   Gram Stain   Final    ABUNDANT WBC PRESENT, PREDOMINANTLY PMN FEW GRAM POSITIVE COCCI IN PAIRS IN CHAINS Performed at [facility], [place] [address] [place], [place] [address]    Culture   Final    RARE GROUP A STREP (S.PYOGENES) ISOLATED NO ANAEROBES ISOLATED; CULTURE IN PROGRESS FOR 5 DAYS    Report Status PENDING  Incomplete   Radiology Studies: No results found.  Scheduled Meds: . Chlorhexidine Gluconate Cloth  6 each Topical Daily  . enoxaparin (LOVENOX) injection  40 mg Subcutaneous Q24H  . feeding supplement (ENSURE ENLIVE)  237 mL Oral BID BM  . FLUoxetine  20 mg Oral Daily  . gabapentin  600 mg Oral QID  . mouth rinse  15 mL Mouth Rinse BID  . multivitamin with minerals  1 tablet Oral Daily  . mupirocin ointment  1 application Nasal BID  . mupirocin ointment   Topical Daily  . nicotine  21 mg Transdermal Daily  . polyethylene glycol  17 g Oral Daily  .  QUEtiapine  25-50 mg Oral QHS  . senna  1 tablet Oral BID  . traZODone  150 mg Oral QHS   Continuous Infusions: . sodium chloride 75 mL/hr at 11/22/17 1400  . sodium chloride 10 mL/hr at 11/22/17 1714  . ampicillin (OMNIPEN) IV 2 g (11/26/17 0539)  . lactated ringers Stopped (11/22/17 1400)    LOS: 8 days   Kerney Elbe, DO [facility] Pager [phone]  If 7PM-7AM, please contact night-coverage [email or web address] Password [facility] 11/26/2017, 8:35 AM

## 2017-11-27 ENCOUNTER — Encounter (HOSPITAL_COMMUNITY): Payer: Self-pay | Admitting: *Deleted

## 2017-11-27 ENCOUNTER — Encounter (HOSPITAL_COMMUNITY)
Admission: EM | Disposition: A | Discharge status: Home / Self Care | Payer: Self-pay | Source: Home / Self Care | Attending: Family Medicine

## 2017-11-27 ENCOUNTER — Inpatient Hospital Stay (HOSPITAL_COMMUNITY): Payer: Medicaid Other | Admitting: Certified Registered"

## 2017-11-27 DIAGNOSIS — F419 Anxiety disorder, unspecified: Secondary | ICD-10-CM

## 2017-11-27 DIAGNOSIS — D72829 Elevated white blood cell count, unspecified: Secondary | ICD-10-CM

## 2017-11-27 DIAGNOSIS — L02611 Cutaneous abscess of right foot: Secondary | ICD-10-CM

## 2017-11-27 DIAGNOSIS — F1990 Other psychoactive substance use, unspecified, uncomplicated: Secondary | ICD-10-CM

## 2017-11-27 DIAGNOSIS — M65071 Abscess of tendon sheath, right ankle and foot: Secondary | ICD-10-CM

## 2017-11-27 DIAGNOSIS — F329 Major depressive disorder, single episode, unspecified: Secondary | ICD-10-CM

## 2017-11-27 HISTORY — PX: I & D EXTREMITY: SHX5045

## 2017-11-27 LAB — CBC WITH DIFFERENTIAL/PLATELET
Basophils Absolute: 0 10*3/uL (ref 0.0–0.1)
Basophils Relative: 0 %
EOS PCT: 2 %
Eosinophils Absolute: 0.2 10*3/uL (ref 0.0–0.7)
HEMATOCRIT: 34.5 % — AB (ref 36.0–46.0)
HEMOGLOBIN: 10.7 g/dL — AB (ref 12.0–15.0)
LYMPHS ABS: 2.5 10*3/uL (ref 0.7–4.0)
LYMPHS PCT: 22 %
MCH: 28.3 pg (ref 26.0–34.0)
MCHC: 31 g/dL (ref 30.0–36.0)
MCV: 91.3 fL (ref 78.0–100.0)
Monocytes Absolute: 0.6 10*3/uL (ref 0.1–1.0)
Monocytes Relative: 5 %
NEUTROS ABS: 8.1 10*3/uL — AB (ref 1.7–7.7)
Neutrophils Relative %: 71 %
PLATELETS: 517 10*3/uL — AB (ref 150–400)
RBC: 3.78 MIL/uL — AB (ref 3.87–5.11)
RDW: 14.2 % (ref 11.5–15.5)
WBC: 11.4 10*3/uL — ABNORMAL HIGH (ref 4.0–10.5)

## 2017-11-27 LAB — COMPREHENSIVE METABOLIC PANEL
ALK PHOS: 74 U/L (ref 38–126)
ALT: 23 U/L (ref 14–54)
AST: 20 U/L (ref 15–41)
Albumin: 2.1 g/dL — ABNORMAL LOW (ref 3.5–5.0)
Anion gap: 7 (ref 5–15)
BILIRUBIN TOTAL: 0.4 mg/dL (ref 0.3–1.2)
BUN: 14 mg/dL (ref 6–20)
CALCIUM: 8.3 mg/dL — AB (ref 8.9–10.3)
CO2: 27 mmol/L (ref 22–32)
CREATININE: 0.93 mg/dL (ref 0.44–1.00)
Chloride: 105 mmol/L (ref 101–111)
GFR calc Af Amer: >60 mL/min (ref >60)
Glucose, Bld: 94 mg/dL (ref 65–99)
Potassium: 4.1 mmol/L (ref 3.5–5.1)
Sodium: 139 mmol/L (ref 135–145)
TOTAL PROTEIN: 5.7 g/dL — AB (ref 6.5–8.1)

## 2017-11-27 LAB — AEROBIC/ANAEROBIC CULTURE W GRAM STAIN (SURGICAL/DEEP WOUND)

## 2017-11-27 LAB — AEROBIC/ANAEROBIC CULTURE (SURGICAL/DEEP WOUND)

## 2017-11-27 LAB — PHOSPHORUS: PHOSPHORUS: 5.5 mg/dL — AB (ref 2.5–4.6)

## 2017-11-27 LAB — MAGNESIUM: MAGNESIUM: 2.1 mg/dL (ref 1.7–2.4)

## 2017-11-27 SURGERY — IRRIGATION AND DEBRIDEMENT EXTREMITY
Anesthesia: General | Laterality: Right

## 2017-11-27 MED ORDER — DEXAMETHASONE SODIUM PHOSPHATE 10 MG/ML IJ SOLN
INTRAMUSCULAR | Status: AC
  Filled 2017-11-27: qty 1

## 2017-11-27 MED ORDER — PROPOFOL 10 MG/ML IV BOLUS
INTRAVENOUS | Status: AC
  Filled 2017-11-27: qty 20

## 2017-11-27 MED ORDER — METOCLOPRAMIDE HCL 5 MG/ML IJ SOLN: 5.0000 mg | Freq: Three times a day (TID) | INTRAMUSCULAR | Status: DC | PRN

## 2017-11-27 MED ORDER — 0.9 % SODIUM CHLORIDE (POUR BTL) OPTIME
TOPICAL | Status: DC | PRN
  Administered 2017-11-27 (×2): 1000 mL

## 2017-11-27 MED ORDER — ONDANSETRON HCL 4 MG/2ML IJ SOLN
INTRAMUSCULAR | Status: AC
  Filled 2017-11-27: qty 2

## 2017-11-27 MED ORDER — DEXAMETHASONE SODIUM PHOSPHATE 10 MG/ML IJ SOLN
INTRAMUSCULAR | Status: DC | PRN
  Administered 2017-11-27: 10 mg via INTRAVENOUS

## 2017-11-27 MED ORDER — OXYCODONE HCL 5 MG PO TABS
ORAL_TABLET | ORAL | Status: AC
  Administered 2017-11-27: 15 mg via ORAL
  Filled 2017-11-27: qty 2

## 2017-11-27 MED ORDER — BISACODYL 10 MG RE SUPP: 10.0000 mg | Freq: Every day | RECTAL | Status: DC | PRN

## 2017-11-27 MED ORDER — MAGNESIUM CITRATE PO SOLN
1.0000 | Freq: Once | ORAL | Status: AC | PRN
  Administered 2017-12-02: 1 via ORAL

## 2017-11-27 MED ORDER — SODIUM CHLORIDE 0.9 % IV SOLN
INTRAVENOUS | Status: DC
  Administered 2017-11-27: 12:00:00 via INTRAVENOUS

## 2017-11-27 MED ORDER — FENTANYL CITRATE (PF) 250 MCG/5ML IJ SOLN
INTRAMUSCULAR | Status: AC
  Filled 2017-11-27: qty 5

## 2017-11-27 MED ORDER — OXYCODONE HCL 5 MG PO TABS
15.0000 mg | ORAL_TABLET | ORAL | Status: DC | PRN
  Administered 2017-11-27 – 2017-12-02 (×26): 15 mg via ORAL
  Filled 2017-11-27 (×27): qty 3

## 2017-11-27 MED ORDER — ONDANSETRON HCL 4 MG/2ML IJ SOLN
INTRAMUSCULAR | Status: DC | PRN
  Administered 2017-11-27: 4 mg via INTRAVENOUS

## 2017-11-27 MED ORDER — PROPOFOL 10 MG/ML IV BOLUS
INTRAVENOUS | Status: DC | PRN
  Administered 2017-11-27: 170 mg via INTRAVENOUS

## 2017-11-27 MED ORDER — MIDAZOLAM HCL 5 MG/5ML IJ SOLN
INTRAMUSCULAR | Status: DC | PRN
  Administered 2017-11-27: 2 mg via INTRAVENOUS

## 2017-11-27 MED ORDER — ONDANSETRON HCL 4 MG/2ML IJ SOLN: 4.0000 mg | Freq: Four times a day (QID) | INTRAMUSCULAR | Status: DC | PRN

## 2017-11-27 MED ORDER — METOCLOPRAMIDE HCL 5 MG PO TABS: 5.0000 mg | ORAL_TABLET | Freq: Three times a day (TID) | ORAL | Status: DC | PRN

## 2017-11-27 MED ORDER — LACTATED RINGERS IV SOLN
INTRAVENOUS | Status: DC
  Administered 2017-11-27: 09:00:00 via INTRAVENOUS

## 2017-11-27 MED ORDER — POLYETHYLENE GLYCOL 3350 17 G PO PACK: 17.0000 g | PACK | Freq: Every day | ORAL | Status: DC | PRN

## 2017-11-27 MED ORDER — FENTANYL CITRATE (PF) 250 MCG/5ML IJ SOLN
INTRAMUSCULAR | Status: DC | PRN
  Administered 2017-11-27 (×3): 50 ug via INTRAVENOUS
  Administered 2017-11-27: 100 ug via INTRAVENOUS

## 2017-11-27 MED ORDER — DOCUSATE SODIUM 100 MG PO CAPS
100.0000 mg | ORAL_CAPSULE | Freq: Two times a day (BID) | ORAL | Status: DC
  Administered 2017-11-27 – 2017-12-02 (×9): 100 mg via ORAL
  Filled 2017-11-27 (×9): qty 1

## 2017-11-27 MED ORDER — METHOCARBAMOL 500 MG PO TABS
ORAL_TABLET | ORAL | Status: AC
  Administered 2017-11-28: 1500 mg via ORAL
  Filled 2017-11-27: qty 3

## 2017-11-27 MED ORDER — ONDANSETRON HCL 4 MG PO TABS: 4.0000 mg | ORAL_TABLET | Freq: Four times a day (QID) | ORAL | Status: DC | PRN

## 2017-11-27 MED ORDER — METHOCARBAMOL 500 MG PO TABS
500.0000 mg | ORAL_TABLET | Freq: Four times a day (QID) | ORAL | Status: DC | PRN
  Administered 2017-11-30 (×2): 500 mg via ORAL
  Filled 2017-11-27 (×4): qty 1

## 2017-11-27 MED ORDER — MIDAZOLAM HCL 2 MG/2ML IJ SOLN
INTRAMUSCULAR | Status: AC
  Filled 2017-11-27: qty 2

## 2017-11-27 MED ORDER — METHOCARBAMOL 1000 MG/10ML IJ SOLN
500.0000 mg | Freq: Four times a day (QID) | INTRAVENOUS | Status: DC | PRN
  Filled 2017-11-27: qty 5

## 2017-11-27 MED ORDER — SODIUM CHLORIDE 0.9 % IR SOLN
Status: DC | PRN
  Administered 2017-11-27: 3000 mL

## 2017-11-27 SURGICAL SUPPLY — 36 items
ALLOGRAFT SKIN MESHD 125 SQ CM (Tissue) ×1 IMPLANT
BLADE SURG 21 STRL SS (BLADE) ×3 IMPLANT
BNDG COHESIVE 6X5 TAN STRL LF (GAUZE/BANDAGES/DRESSINGS) IMPLANT
BNDG GAUZE ELAST 4 BULKY (GAUZE/BANDAGES/DRESSINGS) ×6 IMPLANT
CANISTER WOUNDNEG PRESSURE 500 (CANNISTER) ×2 IMPLANT
COVER SURGICAL LIGHT HANDLE (MISCELLANEOUS) ×6 IMPLANT
DRAPE U-SHAPE 47X51 STRL (DRAPES) ×3 IMPLANT
DRSG ADAPTIC 3X8 NADH LF (GAUZE/BANDAGES/DRESSINGS) ×3 IMPLANT
DRSG VAC ATS LRG SENSATRAC (GAUZE/BANDAGES/DRESSINGS) ×2 IMPLANT
DURAPREP 26ML APPLICATOR (WOUND CARE) ×3 IMPLANT
ELECT REM PT RETURN 9FT ADLT (ELECTROSURGICAL)
ELECTRODE REM PT RTRN 9FT ADLT (ELECTROSURGICAL) IMPLANT
GAUZE SPONGE 4X4 12PLY STRL (GAUZE/BANDAGES/DRESSINGS) ×3 IMPLANT
GLOVE BIOGEL PI IND STRL 9 (GLOVE) ×1 IMPLANT
GLOVE BIOGEL PI INDICATOR 9 (GLOVE) ×2
GLOVE SURG ORTHO 9.0 STRL STRW (GLOVE) ×3 IMPLANT
GOWN STRL REUS W/ TWL XL LVL3 (GOWN DISPOSABLE) ×2 IMPLANT
GOWN STRL REUS W/TWL XL LVL3 (GOWN DISPOSABLE) ×4
GRAFT TISS MESH 125 BURN (Tissue) IMPLANT
HANDPIECE INTERPULSE COAX TIP (DISPOSABLE)
KIT BASIN OR (CUSTOM PROCEDURE TRAY) ×3 IMPLANT
KIT DRSG PREVENA PLUS 7DAY 125 (MISCELLANEOUS) ×2 IMPLANT
KIT TURNOVER KIT B (KITS) ×3 IMPLANT
MANIFOLD NEPTUNE II (INSTRUMENTS) ×3 IMPLANT
NS IRRIG 1000ML POUR BTL (IV SOLUTION) ×3 IMPLANT
PACK ORTHO EXTREMITY (CUSTOM PROCEDURE TRAY) ×3 IMPLANT
PAD ARMBOARD 7.5X6 YLW CONV (MISCELLANEOUS) ×6 IMPLANT
SET HNDPC FAN SPRY TIP SCT (DISPOSABLE) IMPLANT
SKIN MESHED 125 SQ CM (Tissue) ×3 IMPLANT
STOCKINETTE IMPERVIOUS 9X36 MD (GAUZE/BANDAGES/DRESSINGS) IMPLANT
SWAB COLLECTION DEVICE MRSA (MISCELLANEOUS) ×3 IMPLANT
SWAB CULTURE ESWAB REG 1ML (MISCELLANEOUS) IMPLANT
TOWEL OR 17X26 10 PK STRL BLUE (TOWEL DISPOSABLE) ×3 IMPLANT
TUBE CONNECTING 12'X1/4 (SUCTIONS) ×1
TUBE CONNECTING 12X1/4 (SUCTIONS) ×2 IMPLANT
YANKAUER SUCT BULB TIP NO VENT (SUCTIONS) ×3 IMPLANT

## 2017-11-27 NOTE — Progress Notes (Signed)
Orthopedic Tech Progress Note Patient Details:  Stacey Martinez 06/21/1981 [id number]  Ortho Devices Type of Ortho Device: CAM walker Ortho Device/Splint Location: rle Ortho Device/Splint Interventions: Application   Post Interventions Patient Tolerated: Well Instructions Provided: Care of device   Nikki DomCrawford, Aleighna Wojtas 11/27/2017, 1:27 PM

## 2017-11-27 NOTE — Op Note (Signed)
11/27/2017  11:03 AM  PATIENT:  Stacey Martinez    PRE-OPERATIVE DIAGNOSIS:  Abscess Right Foot, with open wound.  POST-OPERATIVE DIAGNOSIS: Abscess right foot with open wound with progressive necrosis of the dorsal skin flap to the right foot  PROCEDURE:  REPEAT DEBRIDEMENT OF THE RIGHT FOOT with sharp excision skin and soft tissue muscle and fascia,  Local tissue rearrangement for partial wound closure 5 x 5 cm , Application of split-thickness allograft 125 cm allograft.  Wound size 15 x 7 cm.   SURGEON:  Nadara MustardMarcus V Jimmy Stipes, MD  PHYSICIAN ASSISTANT:None ANESTHESIA:   General  PREOPERATIVE INDICATIONS:  Stacey Martinez is a  37 y.o. female with a diagnosis of Abscess Right Foot who failed conservative measures and elected for surgical management.    The risks benefits and alternatives were discussed with the patient preoperatively including but not limited to the risks of infection, bleeding, nerve injury, cardiopulmonary complications, the need for revision surgery, among others, and the patient was willing to proceed.  OPERATIVE IMPLANTS: Allograft split-thickness skin graft 125 cm  @ENCIMAGES @  OPERATIVE FINDINGS: Progressive necrosis of the dorsal skin flap that required resection.  OPERATIVE PROCEDURE: Patient was brought to the operating room and underwent a general anesthetic.  After adequate levels of anesthesia were obtained patient's right lower extremity was prepped using DuraPrep and draped into a sterile field a timeout was called.  Visualization patient had progressive necrosis of the entire dorsal skin flap.  This was sharply excised with a 21 blade knife there was further debridement of skin and soft tissue using a rondure to excise skin and soft tissue muscle and fascia.  There was good petechial bleeding no signs of abscess or infection.  The foot was irrigated with pulsatile lavage 3 L.  Local tissue rearrangement was used to close the proximal aspect the incision that was  5 x 5 cm.  The remaining wound was then covered with split-thickness skin graft allograft held in place with staples and the cleanse choice Praveena wound VAC was applied.  Patient was extubated taken the PACU in stable condition.   DISCHARGE PLANNING:  Antibiotic duration: Continue antibiotics as needed for the abscess of the right neck and left arm.  No signs of infection in the right foot.  Weightbearing: Nonweightbearing right lower extremity.  Pain medication: As ordered.  Dressing care/ Wound VAC: Continue the hospital wound VAC until discharge and then transition to the Praveena plus portable wound VAC pump for discharge.  Ambulatory devices: Crutches walker or wheelchair.  Discharge to: May need discharge to skilled nursing.  Follow-up: In the office 1 week post operative.

## 2017-11-27 NOTE — Transfer of Care (Signed)
Immediate Anesthesia Transfer of Care Note  Patient: Stacey Martinez  Procedure(s) Performed: REPEAT DEBRIDEMENT OF THE RIGHT FOOT, WOUND CLOSURE (Right )  Patient Location: PACU  Anesthesia Type:General  Level of Consciousness: drowsy and patient cooperative  Airway & Oxygen Therapy: Patient Spontanous Breathing and Patient connected to face mask oxygen  Post-op Assessment: Report given to RN and Post -op Vital signs reviewed and stable  Post vital signs: Reviewed and stable  Last Vitals:  Vitals Value Taken Time  BP 132/80 11/27/2017 11:05 AM  Temp 36.3 C 11/27/2017 11:05 AM  Pulse 83 11/27/2017 11:07 AM  Resp 9 11/27/2017 11:07 AM  SpO2 99 % 11/27/2017 11:07 AM  Vitals shown include unvalidated device data.  Last Pain:  Vitals:   11/27/17 1105  TempSrc:   PainSc: 10-Worst pain ever      Patients Stated Pain Goal: 3 (11/26/17 0936)  Complications: No apparent anesthesia complications

## 2017-11-27 NOTE — Anesthesia Postprocedure Evaluation (Signed)
Anesthesia Post Note  Patient: Kensli Nyborg  Procedure(s) Performed: REPEAT DEBRIDEMENT OF THE RIGHT FOOT, WOUND CLOSURE (Right )     Patient location during evaluation: PACU Anesthesia Type: General Level of consciousness: awake and alert Pain management: pain level controlled Vital Signs Assessment: post-procedure vital signs reviewed and stable Respiratory status: spontaneous breathing, nonlabored ventilation, respiratory function stable and patient connected to nasal cannula oxygen Cardiovascular status: blood pressure returned to baseline and stable Postop Assessment: no apparent nausea or vomiting Anesthetic complications: no    Last Vitals:  Vitals:   11/27/17 1136 11/27/17 1140  BP: 117/85 113/74  Pulse: 80 83  Resp: 18 18  Temp: 36.9 C 36.8 C  SpO2: 95% 98%    Last Pain:  Vitals:   11/27/17 1140  TempSrc: Oral  PainSc: 10-Worst pain ever                 Trevor IhaStephen A Ayesha Markwell

## 2017-11-27 NOTE — Interval H&P Note (Signed)
History and Physical Interval Note:  11/27/2017 6:41 AM  [name]  has presented today for surgery, with the diagnosis of Abscess Right Foot  The various methods of treatment have been discussed with the patient and family. After consideration of risks, benefits and other options for treatment, the patient has consented to  Procedure(s): REPEAT DEBRIDEMENT OF THE RIGHT FOOT, WOUND CLOSURE (Right) as a surgical intervention .  The patient's history has been reviewed, patient examined, no change in status, stable for surgery.  I have reviewed the patient's chart and labs.  Questions were answered to the patient's satisfaction.     Nadara MustardMarcus V Sylvester Salonga

## 2017-11-27 NOTE — Progress Notes (Signed)
PROGRESS NOTE    Stacey Martinez  ZOX:[id number] DOB: 03-31-1981 DOA: 11/18/2017 PCP: Arlie Solomons, MD   Brief Narrative:  Brief Narrative   37 y.o.femalewith history of IVDA (mostly cocaine, intermittently heroin)-presented with fever, worsening right foot/ankle pain, and erythema/swelling in a area in the lower part of her right neck. She was thought to have soft tissue infection/cellulitis-and started on vancomycin,however in spite of being on antimicrobial therapy for 48 hours,patient continuedto have persistent fever, and worsening swelling of the right foot highly concerning for underlying septic arthritis andabscess in the lower part of right neck area.  Patient was transferred to [facility] I&D, seen by Dr Lajoyce Corners and it was done on 4/12 along with I and D of Neck Abscess by Dr. Derrell Lolling.   Burna Mortimer AM she was seen in bed resting; Nurse paged this Afternoon stating that when he was doing the dressing change, neck wound was profusely bleeding and stopped eventually after pressure was held for at least 5-10 minutes. General Surgery evaluated and felt no intervention was necessary.  Patient is awaiting repeat I&D and wound closure of the right ankle to be done Wednesday 4/17.  This a.m. patient was complaining of intractable pain so IV ketorolac was added to her pain regimen.  Assessment & Plan:   Principal Problem:   Cellulitis Active Problems:   Leukocytosis   IVDU (intravenous drug user)   Anxiety and depression   Cellulitis of right ankle   Abscess of tendon sheath, right ankle and foot   Abscess of right foot  Right foot cellulitis and abscess due to S. pyogenes: Concern for septic arthritis in setting of IVDU. s/p I&D 4/12 and repeat I&D, partial closure 4/17 by Dr. Lajoyce Corners. Blood culture negative.  Discussed Case with ID Dr. Luciana Axe: Changed to Ampicillin x3 weeks, continue IV while admitted, then transition to PO (MUST NOT DC WITH MIDLINE IN PLACE) - Continue multimodal  pain control as ordered including toradol, tylenol, gabapentin, robaxin, po oxycodone. Will increase oxycodone dose to 15mg  as I believe she is opioid-tolerant. Also continuing IV morphine for breakthrough pain though this will be tapered in the coming days following surgery.  - Continue bowel regimen and antiemetics prn.  Supraclavicular fossa abscess: measuring 3x5 cm.Intramuscular extension of abscess into the right sternocleidomastoid muscle with several loculations. s/p I&D 4/12 with culture growing group A strep.  - General surgery recommended dressing change W>D BID, will follow up in clinic in 2 weeks. Will need HH-RN for daily dressing changes.   IVDU, polysubstance abuse, tobacco use: UDS + amphetamines, benzodiazepines,opiates,cocaine, THC. Hepatitis Panel Negative, HIV, and RPR Negative  - Would not continue opioid analgesic at discharge.  - Counseled pt, though will have to continue efforts.  - Nicotine patch ordered.  - PULL MIDLINE PRIOR TO DISCHARGE  Anxiety and depression: Dual diagnosis.  - Continue fluoxetine, quetiapine, trazodone and prn xanax only BID to avoid over sedation.   Thrombocytosis: Reactive.  - Monitor.    Hyperglycemia, prediabetes: HbA1c 5.7%.  - Change to carb-modified diet if hyperglycemia returns.   Normocytic anemia: Stable.   DVT prophylaxis: Enoxaparin 40 mg sq q24h Code Status: FULL CODE Family Communication: No family present at bedside Disposition Plan: Home when stable. PT/OT recommending Home Health OT she will need a rolling walker with 5 inch wheels in a wheelchair.  Consultants:   Orthopedic Surgery  General Surgery   Discussed Case with ID Dr. Luciana Axe   Procedures:  -Right Abscess Neck I&D. Dr. Derrell Lolling -Right Ankle  Debridement and Wound Vac Placement. Dr. Lajoyce Corners - Repeat debridement of right foot with sharp excision skin and soft tissue muscle and fascia / local tissue rearrangement for partial wound closure 5x5cm /  application of split-thickness allograft, wound size 15x7cm. Dr. Lajoyce Corners 11/27/2017   Antimicrobials:  Anti-infectives (From admission, onward)   Start     Dose/Rate Route Frequency Ordered Stop   11/27/17 0600  ceFAZolin (ANCEF) IVPB 2g/100 mL premix     2 g 200 mL/hr over 30 Minutes Intravenous On call to O.R. 11/26/17 2012 11/27/17 1044   11/24/17 2100  ampicillin (OMNIPEN) 2 g in sodium chloride 0.9 % 100 mL IVPB     2 g 300 mL/hr over 20 Minutes Intravenous Every 4 hours 11/24/17 1622     11/24/17 1700  ampicillin (OMNIPEN) 2 g in sodium chloride 0.9 % 100 mL IVPB  Status:  Discontinued     2 g 300 mL/hr over 20 Minutes Intravenous Every 4 hours 11/24/17 1619 11/24/17 1622   11/23/17 2238  piperacillin-tazobactam (ZOSYN) IVPB 3.375 g  Status:  Discontinued     3.375 g 12.5 mL/hr over 240 Minutes Intravenous Every 8 hours 11/23/17 2238 11/24/17 1619   11/22/17 1000  ceFAZolin (ANCEF) IVPB 2g/100 mL premix     2 g 200 mL/hr over 30 Minutes Intravenous To Short Stay 11/22/17 0445 11/22/17 1203   11/21/17 1818  piperacillin-tazobactam (ZOSYN) IVPB 3.375 g  Status:  Discontinued     3.375 g 12.5 mL/hr over 240 Minutes Intravenous Every 8 hours 11/21/17 1205 11/23/17 2238   11/20/17 1600  vancomycin (VANCOCIN) IVPB 1000 mg/200 mL premix  Status:  Discontinued     1,000 mg 200 mL/hr over 60 Minutes Intravenous Every 12 hours 11/20/17 0755 11/24/17 1619   11/20/17 1400  piperacillin-tazobactam (ZOSYN) IVPB 3.375 g  Status:  Discontinued     3.375 g 12.5 mL/hr over 240 Minutes Intravenous Every 8 hours 11/20/17 1237 11/21/17 1205   11/19/17 0400  vancomycin (VANCOCIN) IVPB 750 mg/150 ml premix  Status:  Discontinued     750 mg 150 mL/hr over 60 Minutes Intravenous Every 12 hours 11/18/17 2207 11/20/17 0755   11/18/17 1800  vancomycin (VANCOCIN) IVPB 1000 mg/200 mL premix     1,000 mg 200 mL/hr over 60 Minutes Intravenous  Once 11/18/17 1758 11/18/17 2021     Subjective: Complaining of  severe pain since medications were withheld prior to surgery, has been improved with morphine and oxycodone. No fevers.   Objective: Vitals:   11/27/17 1118 11/27/17 1122 11/27/17 1136 11/27/17 1140  BP: 118/72 117/76 117/85 113/74  Pulse: 82 79 80 83  Resp: 12 (!) 8 18 18   Temp:  97.7 F (36.5 C) 98.5 F (36.9 C) 98.3 F (36.8 C)  TempSrc:   Oral Oral  SpO2: 97% 96% 95% 98%  Weight:      Height:        Intake/Output Summary (Last 24 hours) at 11/27/2017 1729 Last data filed at 11/27/2017 1500 Gross per 24 hour  Intake 1750 ml  Output 375 ml  Net 1375 ml   Filed Weights   11/18/17 2152 11/22/17 0946 11/27/17 0853  Weight: 69 kg (152 lb 3.2 oz) 68.9 kg (152 lb) 68.9 kg (152 lb)   Examination: Constitutional: WDWN 36yo F in no distress appearing older than stated age.  Eyes: Sclera anicteric.  Lids and conjunctive are normal. ENMT: MMM, no oral ulcers, poor dentition Neck: Supple with no JVD. Right anterolateral neck with  dressing changed today c/d/i Respiratory: Nonlabored and clear bilaterally.   Cardiovascular: Regular rate and rhythm. No m/r/g. No lower extremity edema appreciated Abdomen: Soft, nontender, nondistended.  Bowel sounds present in all 4 quadrants. Musculoskeletal: No contractures, no cyanosis.  Has a right arm midline without tenderness, erythema, fluctuance.  Right ankle with lateral wound vac applied over skin graft with staples in place inferiorly. Good suction. Toes with active ROM, no numbness, palpable PT pulse.  Skin: Warm and dry, has multiple track marks injections from IV drug abuse in the hands and forearms.  Neurologic: Cranial nerves II through XII grossly intact.  No appreciable focal deficits. Psychiatric: Normal mood and anxious affect.  Awake and alert.  Intact judgment and insight  Data Reviewed: I have personally reviewed following labs and imaging studies  CBC: Recent Labs  Lab 11/24/17 0437 11/24/17 1923 11/25/17 0657 11/26/17 0527  11/27/17 0524  WBC 12.1* 12.9* 11.2* 12.4* 11.4*  NEUTROABS 8.6* 9.6* 7.7 9.0* 8.1*  HGB 11.4* 11.4* 12.5 11.1* 10.7*  HCT 36.0 35.4* 39.3 35.7* 34.5*  MCV 90.5 89.4 92.0 91.1 91.3  PLT 325 303 518* 519* 517*   Basic Metabolic Panel: Recent Labs  Lab 11/23/17 0523 11/24/17 0437 11/25/17 0657 11/26/17 0527 11/27/17 0524  NA 139 139 140 139 139  K 3.9 3.8 4.2 4.0 4.1  CL 103 104 105 105 105  CO2 27 25 28 26 27   GLUCOSE 106* 104* 101* 98 94  BUN 11 12 10 10 14   CREATININE 0.57 0.71 0.73 0.74 0.93  CALCIUM 8.1* 7.9* 8.2* 8.2* 8.3*  MG  --  2.1 2.2 2.2 2.1  PHOS  --  4.3 4.3 4.4 5.5*   GFR: Estimated Creatinine Clearance: 78.2 mL/min (by C-G formula based on SCr of 0.93 mg/dL). Liver Function Tests: Recent Labs  Lab 11/23/17 0523 11/24/17 0437 11/25/17 0657 11/26/17 0527 11/27/17 0524  AST 23 30 26 20 20   ALT 26 32 33 28 23  ALKPHOS 90 74 79 74 74  BILITOT 0.2* 0.3 0.2* 0.3 0.4  PROT 5.9* 5.9* 6.3* 5.9* 5.7*  ALBUMIN 1.8* 1.9* 2.1* 2.0* 2.1*   No results for input(s): LIPASE, AMYLASE in the last 168 hours. No results for input(s): AMMONIA in the last 168 hours. Coagulation Profile: No results for input(s): INR, PROTIME in the last 168 hours. Cardiac Enzymes: No results for input(s): CKTOTAL, CKMB, CKMBINDEX, TROPONINI in the last 168 hours. BNP (last 3 results) No results for input(s): PROBNP in the last 8760 hours. HbA1C: No results for input(s): HGBA1C in the last 72 hours. CBG: No results for input(s): GLUCAP in the last 168 hours. Lipid Profile: No results for input(s): CHOL, HDL, LDLCALC, TRIG, CHOLHDL, LDLDIRECT in the last 72 hours. Thyroid Function Tests: No results for input(s): TSH, T4TOTAL, FREET4, T3FREE, THYROIDAB in the last 72 hours. Anemia Panel: No results for input(s): VITAMINB12, FOLATE, FERRITIN, TIBC, IRON, RETICCTPCT in the last 72 hours. Sepsis Labs: No results for input(s): PROCALCITON, LATICACIDVEN in the last 168 hours.  Recent  Results (from the past 240 hour(s))  Blood culture (routine x 2)     Status: None   Collection Time: 11/18/17  7:04 PM  Result Value Ref Range Status   Specimen Description   Final    BLOOD LEFT ARM Performed at [facility], [address], [place], [place] [address]    Special Requests   Final    BOTTLES DRAWN AEROBIC AND ANAEROBIC Blood Culture adequate volume Performed at [facility], [address] [place], [place] [address]    Culture   Final    NO GROWTH 5 DAYS Performed at [facility], [address], [place], [place] [address]    Report Status 11/23/2017 FINAL  Final  Blood culture (routine x 2)     Status: None   Collection Time: 11/18/17  7:04 PM  Result Value Ref Range Status   Specimen Description   Final    BLOOD LEFT FOREARM Performed at [facility], [address] [place], [place] [address]    Special Requests   Final    BOTTLES DRAWN AEROBIC AND ANAEROBIC Blood Culture adequate volume Performed at [facility], [address], [place], [place] [address]    Culture   Final    NO GROWTH 5 DAYS Performed at [facility], [address] [place], [place] [address]    Report Status 11/23/2017 FINAL  Final  Surgical pcr screen     Status: Abnormal   Collection Time: 11/20/17 11:20 PM  Result Value Ref Range Status   MRSA, PCR NEGATIVE NEGATIVE Final   Staphylococcus aureus POSITIVE (A) NEGATIVE Final    Comment: (NOTE) The Xpert SA Assay (FDA approved for NASAL specimens in patients 73 years of age and older), is one component of a comprehensive surveillance program. It is not intended to diagnose infection nor to guide or monitor treatment. Performed at [facility], [address] [place], [place] [address]   Aerobic/Anaerobic Culture (surgical/deep wound)     Status: None   Collection Time: 11/22/17 12:13 PM  Result Value Ref Range Status   Specimen  Description FOOT RIGHT ABSCESS  Final   Special Requests NONE  Final   Gram Stain   Final    ABUNDANT WBC PRESENT, PREDOMINANTLY PMN MODERATE GRAM POSITIVE COCCI IN PAIRS IN CHAINS    Culture   Final    FEW GROUP A STREP (S.PYOGENES) ISOLATED NO ANAEROBES ISOLATED Performed at [facility], [address] [place], [place] [address]    Report Status 11/27/2017 FINAL  Final  Aerobic/Anaerobic Culture (surgical/deep wound)     Status: None   Collection Time: 11/22/17 12:14 PM  Result Value Ref Range Status   Specimen Description FOOT RIGHT NECK  Final   Special Requests NONE  Final   Gram Stain   Final    ABUNDANT WBC PRESENT, PREDOMINANTLY PMN FEW GRAM POSITIVE COCCI IN PAIRS IN CHAINS    Culture   Final    RARE GROUP A STREP (S.PYOGENES) ISOLATED NO ANAEROBES ISOLATED Performed at [facility], [address] [place], [place] [address]    Report Status 11/27/2017 FINAL  Final   Radiology Studies: No results found.  Scheduled Meds: . docusate sodium  100 mg Oral BID  . enoxaparin (LOVENOX) injection  40 mg Subcutaneous Q24H  . feeding supplement (ENSURE ENLIVE)  237 mL Oral BID BM  . FLUoxetine  20 mg Oral Daily  . gabapentin  600 mg Oral QID  . mouth rinse  15 mL Mouth Rinse BID  . methocarbamol      . multivitamin with minerals  1 tablet Oral Daily  . mupirocin ointment   Topical Daily  . nicotine  21 mg Transdermal Daily  . oxyCODONE      . polyethylene glycol  17 g Oral Daily  . QUEtiapine  25-50 mg Oral QHS  . senna  1 tablet  Oral BID  . traZODone  150 mg Oral QHS   Continuous Infusions: . sodium chloride 75 mL/hr at 11/22/17 1400  . sodium chloride 10 mL/hr at 11/22/17 1714  . sodium chloride 10 mL/hr at 11/27/17 1153  . ampicillin (OMNIPEN) IV 2 g (11/27/17 1723)  . lactated ringers 10 mL/hr at 11/27/17 0900  . methocarbamol (ROBAXIN)  IV      LOS: 9 days   Hazeline Junkeryan Lively Haberman, MD [facility] Pager [phone]  If 7PM-7AM, please contact  night-coverage [email or web address] Password [email or web address] 11/27/2017, 5:29 PM

## 2017-11-27 NOTE — Progress Notes (Addendum)
Nutrition Follow Up  DOCUMENTATION CODES:   Not applicable  INTERVENTION:    Continue Ensure Enlive po BID, each supplement provides 350 kcal and 20 grams of protein  Continue MVI daily  NUTRITION DIAGNOSIS:   Increased nutrient needs related to wound healing as evidenced by estimated needs, ongoing  GOAL:   Patient will meet greater than or equal to 90% of their needs, met  MONITOR:   PO intake, Supplement acceptance, Weight trends, Labs, Skin, I & O's  ASSESSMENT:   Patient with PMH significant for IVDA of cocaine  and mood disorder. Presents this admission with right inner thigh where she previously injected and cellulitis of the right foot.    Pt s/p procedure 4/12: I&D NECK ABSCESS I&D RIGHT ANKLE  PLACEMENT OF WOUND VAC  Pt is currently in PERIOP For repeat debridement of R foot & wound closure. PO intake has been 100% per flowsheet records. Has been receiving Ensure Enlive supplements. Labs and medications reviewed.  Diet Order:  Diet regular Room service appropriate? Yes; Fluid consistency: Thin  EDUCATION NEEDS:   Education needs have been addressed  Skin:  Skin Assessment: Skin Integrity Issues: Skin Integrity Issues:: Wound VAC Wound Vac: R ankle Other: right leg wound from injection site  Last BM:  4/15   Intake/Output Summary (Last 24 hours) at 11/27/2017 1647 Last data filed at 11/27/2017 1500 Gross per 24 hour  Intake 1750 ml  Output 375 ml  Net 1375 ml   Height:   Ht Readings from Last 1 Encounters:  11/27/17 '5\' 4"'  (1.626 m)   Weight:   Wt Readings from Last 1 Encounters:  11/27/17 152 lb (68.9 kg)   Ideal Body Weight:  54.5 kg  BMI:  Body mass index is 26.09 kg/m.  Estimated Nutritional Needs:   Kcal:  2194-7125  Protein:  85-100 gm  Fluid:  1.7-1.9 L  Arthur Holms, RD, LDN Pager #: [phone] After-Hours Pager #: [phone]

## 2017-11-27 NOTE — Anesthesia Preprocedure Evaluation (Signed)
Anesthesia Evaluation  Patient identified by MRN, date of birth, ID band Patient awake    Reviewed: Allergy & Precautions, NPO status , Patient's Chart, lab work & pertinent test results  Airway Mallampati: III  TM Distance: >3 FB Neck ROM: Full    Dental  (+) Edentulous Upper   Pulmonary Current Smoker,    Pulmonary exam normal breath sounds clear to auscultation       Cardiovascular negative cardio ROS Normal cardiovascular exam Rhythm:Regular Rate:Normal  ECG: SR, rate 84  ECHO: LV EF: 55% - 60% LVEF 55-60%, mild LVH, normal wall motion, aortic sclerosis with increased valvular velocity, trivial MR, normal atrial size, normal IVC. There was no obvious vegetation.     Neuro/Psych PSYCHIATRIC DISORDERS Anxiety negative neurological ROS     GI/Hepatic negative GI ROS, (+)     substance abuse  ,   Endo/Other  negative endocrine ROS  Renal/GU negative Renal ROS     Musculoskeletal negative musculoskeletal ROS (+)   Abdominal   Peds  Hematology  (+) anemia ,   Anesthesia Other Findings Abscess Right Ankle  Reproductive/Obstetrics                             Anesthesia Physical Anesthesia Plan  ASA: III  Anesthesia Plan: General   Post-op Pain Management:    Induction: Intravenous  PONV Risk Score and Plan: Treatment may vary due to age or medical condition and Ondansetron  Airway Management Planned: LMA  Additional Equipment:   Intra-op Plan:   Post-operative Plan:   Informed Consent: I have reviewed the patients History and Physical, chart, labs and discussed the procedure including the risks, benefits and alternatives for the proposed anesthesia with the patient or authorized representative who has indicated his/her understanding and acceptance.     Plan Discussed with: CRNA  Anesthesia Plan Comments:         Anesthesia Quick Evaluation

## 2017-11-27 NOTE — Anesthesia Procedure Notes (Addendum)
Procedure Name: LMA Insertion Date/Time: 11/27/2017 10:38 AM Performed by: Army FossaPulliam, Richardson Dubree Dane, CRNA Pre-anesthesia Checklist: Patient identified, Emergency Drugs available, Suction available and Patient being monitored Patient Re-evaluated:Patient Re-evaluated prior to induction Oxygen Delivery Method: Circle System Utilized Preoxygenation: Pre-oxygenation with 100% oxygen Induction Type: IV induction Ventilation: Mask ventilation without difficulty LMA: LMA inserted LMA Size: 3.0 Number of attempts: 1 Airway Equipment and Method: Bite block Placement Confirmation: positive ETCO2 Tube secured with: Tape Dental Injury: Teeth and Oropharynx as per pre-operative assessment

## 2017-11-28 ENCOUNTER — Encounter (HOSPITAL_COMMUNITY): Payer: Self-pay | Admitting: Orthopedic Surgery

## 2017-11-28 DIAGNOSIS — Z888 Allergy status to other drugs, medicaments and biological substances status: Secondary | ICD-10-CM

## 2017-11-28 DIAGNOSIS — L0211 Cutaneous abscess of neck: Secondary | ICD-10-CM

## 2017-11-28 DIAGNOSIS — F1410 Cocaine abuse, uncomplicated: Secondary | ICD-10-CM

## 2017-11-28 DIAGNOSIS — L03115 Cellulitis of right lower limb: Principal | ICD-10-CM

## 2017-11-28 DIAGNOSIS — B955 Unspecified streptococcus as the cause of diseases classified elsewhere: Secondary | ICD-10-CM

## 2017-11-28 DIAGNOSIS — Z91041 Radiographic dye allergy status: Secondary | ICD-10-CM

## 2017-11-28 DIAGNOSIS — Z978 Presence of other specified devices: Secondary | ICD-10-CM

## 2017-11-28 DIAGNOSIS — Z881 Allergy status to other antibiotic agents status: Secondary | ICD-10-CM

## 2017-11-28 DIAGNOSIS — L02611 Cutaneous abscess of right foot: Secondary | ICD-10-CM

## 2017-11-28 DIAGNOSIS — F39 Unspecified mood [affective] disorder: Secondary | ICD-10-CM

## 2017-11-28 DIAGNOSIS — F17210 Nicotine dependence, cigarettes, uncomplicated: Secondary | ICD-10-CM

## 2017-11-28 MED ORDER — MORPHINE SULFATE (PF) 2 MG/ML IV SOLN
1.0000 mg | INTRAVENOUS | Status: AC | PRN
  Administered 2017-11-28 – 2017-11-30 (×9): 1 mg via INTRAVENOUS
  Filled 2017-11-28 (×10): qty 1

## 2017-11-28 NOTE — Consult Note (Signed)
[facility]    Date of Admission:  11/18/2017     Total days of antibiotics 10   Day 5 Ampicillin            Reason for Consult: Foot abscess    Referring Provider: Jarvis Newcomer Primary Care Provider: .  Assessment: IVDA Step abscesses  Plan: 1. Continue prev anbx (ampicilin) 2. Check TEE  Comment-  She denies accessing her neck for IVDA.  Given the same bacteria in separate sites, IVDA, I would favor ruling out IE.  She swears that she will not abuse drugs again give the pain she has gone through. I asked her about NA and changing her environment at d/c.    Principal Problem:   Cellulitis Active Problems:   Leukocytosis   IVDU (intravenous drug user)   Anxiety and depression   Cellulitis of right ankle   Abscess of tendon sheath, right ankle and foot   Abscess of right foot   . docusate sodium  100 mg Oral BID  . enoxaparin (LOVENOX) injection  40 mg Subcutaneous Q24H  . feeding supplement (ENSURE ENLIVE)  237 mL Oral BID BM  . FLUoxetine  20 mg Oral Daily  . gabapentin  600 mg Oral QID  . mouth rinse  15 mL Mouth Rinse BID  . multivitamin with minerals  1 tablet Oral Daily  . mupirocin ointment   Topical Daily  . nicotine  21 mg Transdermal Daily  . polyethylene glycol  17 g Oral Daily  . QUEtiapine  25-50 mg Oral QHS  . senna  1 tablet Oral BID  . traZODone  150 mg Oral QHS    HPI: Stacey Martinez is a 37 y.o. female with past medical history significant for injecting cocaine and mood disorder. We were asked. She presented on 4/8 with worsened wounds on her right inner thigh with previous injection. She noticed swelling of the upper chest wall and right ankle and associated fevers to 103 degrees prior to admission. In the ED she was found to have leukocytosis to 22.8k and tachycardia. Blood cultures were obtained and she received empiric vancomycin and zosyn until 4/14 when she was transitioned to ampicillin.   Right neck CT  revealed right supraclavicular fossa abscess measuring 3x5 cm with intramuscular extension to the sternocleidomastoid muscle with loculations. MRI of the right ankle without evidence for osteomyelitis, only abscess/soft tissue process. On 4/12 she underwent I&D and VAC placement. Due to increased pain she returned for repeated debridement on 4/15. Cultures on 4/12 with group a strep.   Hepatitis C, B and HIV negative.   Review of Systems: Review of Systems  Constitutional: Negative for chills and fever.  Respiratory: Negative for cough.   Gastrointestinal: Negative for constipation and diarrhea.  Genitourinary: Negative for dysuria.  Please see HPI. All other systems reviewed and negative.   Past Medical History:  Diagnosis Date  . Hx of degenerative disc disease   . Substance abuse (HCC)     Social History   Tobacco Use  . Smoking status: Current Every Day Smoker    Types: Cigarettes  Substance Use Topics  . Alcohol use: Not Currently  . Drug use: Yes    History reviewed. No pertinent family history. Allergies  Allergen Reactions  . Phenytoin Sodium Extended Other (See Comments)    Seizure Fd&c Yellow #6-na Benzoate-phenytoin  . Yellow Dye Other (See Comments)    Seizures Fd&c Yellow #6-na Benzoate-phenytoin  .  Doxycycline Rash    blisters Fixed drug eruption December 2018    OBJECTIVE: Blood pressure 113/72, pulse 76, temperature 98 F (36.7 C), temperature source Oral, resp. rate 17, height 5\' 4"  (1.626 m), weight 152 lb (68.9 kg), SpO2 98 %.  Physical Exam  Constitutional: She is oriented to person, place, and time. She appears well-developed and well-nourished.  HENT:  Mouth/Throat: No oropharyngeal exudate.  Eyes: Pupils are equal, round, and reactive to light. EOM are normal.  Cardiovascular: Normal rate, regular rhythm and normal heart sounds.  Pulmonary/Chest: Effort normal and breath sounds normal.  Abdominal: Soft. Bowel sounds are normal. She exhibits  no distension. There is no tenderness.  Musculoskeletal:       Arms:      Feet:  Neurological: She is alert and oriented to person, place, and time.    Lab Results Lab Results  Component Value Date   WBC 11.4 (H) 11/27/2017   HGB 10.7 (L) 11/27/2017   HCT 34.5 (L) 11/27/2017   MCV 91.3 11/27/2017   PLT 517 (H) 11/27/2017    Lab Results  Component Value Date   CREATININE 0.93 11/27/2017   BUN 14 11/27/2017   NA 139 11/27/2017   K 4.1 11/27/2017   CL 105 11/27/2017   CO2 27 11/27/2017    Lab Results  Component Value Date   ALT 23 11/27/2017   AST 20 11/27/2017   ALKPHOS 74 11/27/2017   BILITOT 0.4 11/27/2017     Microbiology: Recent Results (from the past 240 hour(s))  Blood culture (routine x 2)     Status: None   Collection Time: 11/18/17  7:04 PM  Result Value Ref Range Status   Specimen Description   Final    BLOOD LEFT ARM Performed at [facility], [address] [place], [place] [address]    Special Requests   Final    BOTTLES DRAWN AEROBIC AND ANAEROBIC Blood Culture adequate volume Performed at [facility], [address] [place], [place] [address]    Culture   Final    NO GROWTH 5 DAYS Performed at [facility], [address] [place], [place] [address]    Report Status 11/23/2017 FINAL  Final  Blood culture (routine x 2)     Status: None   Collection Time: 11/18/17  7:04 PM  Result Value Ref Range Status   Specimen Description   Final    BLOOD LEFT FOREARM Performed at [facility], [address] [place], [place] [address]    Special Requests   Final    BOTTLES DRAWN AEROBIC AND ANAEROBIC Blood Culture adequate volume Performed at [facility], [address] [place], [place] [address]    Culture   Final    NO GROWTH 5 DAYS Performed at [facility], [address] [place], [place] [address]    Report Status 11/23/2017 FINAL  Final  Surgical pcr  screen     Status: Abnormal   Collection Time: 11/20/17 11:20 PM  Result Value Ref Range Status   MRSA, PCR NEGATIVE NEGATIVE Final   Staphylococcus aureus POSITIVE (A) NEGATIVE Final    Comment: (NOTE) The Xpert SA Assay (FDA approved for NASAL specimens in patients 39 years of age and older), is one component of a comprehensive surveillance program. It is not intended to diagnose infection nor to guide or monitor treatment. Performed at [facility], [address] [place], [place] [address]  Aerobic/Anaerobic Culture (surgical/deep wound)     Status: None   Collection Time: 11/22/17 12:13 PM  Result Value Ref Range Status   Specimen Description FOOT RIGHT ABSCESS  Final   Special Requests NONE  Final   Gram Stain   Final    ABUNDANT WBC PRESENT, PREDOMINANTLY PMN MODERATE GRAM POSITIVE COCCI IN PAIRS IN CHAINS    Culture   Final    FEW GROUP A STREP (S.PYOGENES) ISOLATED NO ANAEROBES ISOLATED Performed at [facility], [address] [place], [place] [address]    Report Status 11/27/2017 FINAL  Final  Aerobic/Anaerobic Culture (surgical/deep wound)     Status: None   Collection Time: 11/22/17 12:14 PM  Result Value Ref Range Status   Specimen Description FOOT RIGHT NECK  Final   Special Requests NONE  Final   Gram Stain   Final    ABUNDANT WBC PRESENT, PREDOMINANTLY PMN FEW GRAM POSITIVE COCCI IN PAIRS IN CHAINS    Culture   Final    RARE GROUP A STREP (S.PYOGENES) ISOLATED NO ANAEROBES ISOLATED Performed at [facility], [address] [place], [place] [address]    Report Status 11/27/2017 FINAL  Final    Rexene Alberts, MSN, NP-C [facility] Cell: [phone] Pager: [phone]  11/28/2017 4:16 PM

## 2017-11-28 NOTE — Progress Notes (Signed)
Patient ID: Stacey BelfastStacy Martinez, female   DOB: 08/09/1981, 37 y.o.   MRN: [id number] Patient is postoperative day 1 with excision of a large area of necrotic skin over the dorsum of her foot.  The total wound size was 7 x 15 cm.  She now has a wound VAC with split-thickness skin graft.  The wound VAC will need to stay in place for at least a week.  There is 50 cc in the drainage canister.  Patient may be discharged with a portable Praveena wound VAC pump that can be obtained from the operating room.

## 2017-11-28 NOTE — Progress Notes (Signed)
    [facility] has been requested to perform a transesophageal echocardiogram on 11/29/2017 for septic arthritis.  After careful review of history and examination, the risks and benefits of transesophageal echocardiogram have been explained including risks of esophageal damage, perforation (1:10,000 risk), bleeding, pharyngeal hematoma as well as other potential complications associated with conscious sedation including aspiration, arrhythmia, respiratory failure and death. Alternatives to treatment were discussed, questions were answered. Patient is willing to proceed.   Patient has h/o IVDA who recently treated for neck abscess came back with R food cellulitis. Last echo on 11/19/2017 showed normal EF, no vegetation. Vital stable. Mildly anemic. No obvious contraindication to TEE.   Stacey CourseHao Samina Weekes, PA-C 11/28/2017 2:21 PM

## 2017-11-28 NOTE — Plan of Care (Signed)
  Problem: Health Behavior/Discharge Planning: Goal: Ability to manage health-related needs will improve Outcome: Progressing   Problem: Clinical Measurements: Goal: Ability to maintain clinical measurements within normal limits will improve Outcome: Progressing Goal: Will remain free from infection Outcome: Progressing Goal: Diagnostic test results will improve Outcome: Progressing Goal: Respiratory complications will improve Outcome: Progressing Goal: Cardiovascular complication will be avoided Outcome: Progressing   Problem: Activity: Goal: Risk for activity intolerance will decrease Outcome: Progressing   Problem: Nutrition: Goal: Adequate nutrition will be maintained Outcome: Progressing   Problem: Coping: Goal: Level of anxiety will decrease Outcome: Progressing   Problem: Elimination: Goal: Will not experience complications related to bowel motility Outcome: Progressing Goal: Will not experience complications related to urinary retention Outcome: Progressing   Problem: Pain Managment: Goal: General experience of comfort will improve Outcome: Progressing   Problem: Safety: Goal: Ability to remain free from injury will improve Outcome: Progressing   Problem: Skin Integrity: Goal: Risk for impaired skin integrity will decrease Outcome: Progressing   Problem: Clinical Measurements: Goal: Ability to avoid or minimize complications of infection will improve Outcome: Progressing   Problem: Skin Integrity: Goal: Skin integrity will improve Outcome: Progressing   

## 2017-11-28 NOTE — Progress Notes (Signed)
Physical Therapy Treatment Patient Details Name: Stacey BelfastStacy Martinez MRN: [id number] DOB: 05/14/1981 Today's Date: 11/28/2017    History of Present Illness 37 y.o. female with history of IVDA (mostly cocaine, intermittently heroin)-presented with fever, worsening right foot/ankle pain, and erythema/swelling in a area in the lower part of her right neck. She was thought to have soft tissue infection/cellulitis-and started on vancomycin, however in spite of being on antimicrobial therapy for 48 hours, patient continued to have persistent fever, and worsening swelling of the right foot highly concerning for underlying septic arthritis and abscess in the lower part of right neck area. now s/p I&D of neck and R ankle. Plan to go to OR wed 4/17 for another I&D and wound closure.     PT Comments    Session focused on gait training with adherence to NWB status. Patient able to ambulate increased distances with improved balance since initial PT evaluation. Following NWB status well with hop-to gait and RW. Will progress distance as tolerated.    Follow Up Recommendations  Home health PT;Supervision for mobility/OOB     Equipment Recommendations  Rolling walker with 5" wheels;Wheelchair (measurements PT)    Recommendations for Other Services OT consult     Precautions / Restrictions Precautions Precautions: Fall Other Brace/Splint: R CAM boot  Restrictions Weight Bearing Restrictions: Yes LUE Weight Bearing: Weight bearing as tolerated RLE Weight Bearing: Non weight bearing Other Position/Activity Restrictions: WBAT    Mobility  Bed Mobility Overal bed mobility: Modified Independent                Transfers Overall transfer level: Needs assistance Equipment used: Rolling walker (2 wheeled) Transfers: Sit to/from Stand Sit to Stand: Min guard         General transfer comment: VCs for safe hand placement; minguard for safety   Ambulation/Gait Ambulation/Gait assistance: Min  guard Ambulation Distance (Feet): 45 Feet Assistive device: Rolling walker (2 wheeled) Gait Pattern/deviations: Antalgic Gait velocity: decreased   General Gait Details: patient with improved balance and adherence to NWB, able to ambulate into hallway with light min guard/close supervision.    Stairs             Wheelchair Mobility    Modified Rankin (Stroke Patients Only)       Balance Overall balance assessment: Needs assistance Sitting-balance support: No upper extremity supported Sitting balance-Leahy Scale: Good Sitting balance - Comments: static sitting EOB    Standing balance support: Bilateral upper extremity supported;Single extremity supported Standing balance-Leahy Scale: Poor Standing balance comment: currently reliant on UE support                             Cognition Arousal/Alertness: Awake/alert Behavior During Therapy: WFL for tasks assessed/performed Overall Cognitive Status: Within Functional Limits for tasks assessed                                        Exercises      General Comments        Pertinent Vitals/Pain Pain Assessment: Faces Faces Pain Scale: Hurts little more Pain Location: R foot Pain Descriptors / Indicators: Discomfort;Grimacing Pain Intervention(s): Monitored during session;Limited activity within patient's tolerance;Premedicated before session;Repositioned    Home Living                      Prior Function  PT Goals (current goals can now be found in the care plan section) Acute Rehab PT Goals PT Goal Formulation: With patient/family Time For Goal Achievement: 11/30/17 Potential to Achieve Goals: Good Progress towards PT goals: Progressing toward goals    Frequency    Min 3X/week      PT Plan Current plan remains appropriate    Co-evaluation              AM-PAC PT "6 Clicks" Daily Activity  Outcome Measure  Difficulty turning over in bed  (including adjusting bedclothes, sheets and blankets)?: None Difficulty moving from lying on back to sitting on the side of the bed? : None Difficulty sitting down on and standing up from a chair with arms (e.g., wheelchair, bedside commode, etc,.)?: None Help needed moving to and from a bed to chair (including a wheelchair)?: A Little Help needed walking in hospital room?: A Little Help needed climbing 3-5 steps with a railing? : A Lot 6 Click Score: 20    End of Session Equipment Utilized During Treatment: Other (comment) Activity Tolerance: Patient tolerated treatment well Patient left: in bed;with call bell/phone within reach Nurse Communication: Mobility status PT Visit Diagnosis: Pain;Unsteadiness on feet (R26.81);Other abnormalities of gait and mobility (R26.89) Pain - Right/Left: Right Pain - part of body: Ankle and joints of foot     Time: 1625-1650 PT Time Calculation (min) (ACUTE ONLY): 25 min  Charges:  $Gait Training: 8-22 mins                    G Codes:       Etta Grandchild, PT, DPT Acute Rehab Services Pager: [phone]     Etta Grandchild 11/28/2017, 4:53 PM

## 2017-11-28 NOTE — Progress Notes (Signed)
PROGRESS NOTE    Stacey Martinez  EPP:[id number] DOB: 05/14/1981 DOA: 11/18/2017 PCP: Stacey Martinez   Brief Narrative:  Brief Narrative   37 y.o.femalewith history of IVDA (mostly cocaine, intermittently heroin)-presented with fever, worsening right foot/ankle pain, and erythema/swelling in a area in the lower part of her right neck. She was thought to have soft tissue infection/cellulitis-and started on vancomycin,however in spite of being on antimicrobial therapy for 48 hours,patient continuedto have persistent fever, and worsening swelling of the right foot highly concerning for underlying septic arthritis andabscess in the lower part of right neck area.  [facility] Dr Stacey [facility] and it was done on 4/12 along with I and D of Neck Abscess by Stacey Martinez.   Stacey MortimerYesterdat AM she was seen in bed resting; Nurse paged this Afternoon stating that when he was doing the dressing change, neck wound was profusely bleeding and stopped eventually after pressure was held for at least 5-10 minutes. General Surgery evaluated and felt no intervention was necessary.  Patient is awaiting repeat I&D and wound closure of the right ankle to be done Wednesday 4/17.  This a.m. patient was complaining of intractable pain so IV ketorolac was added to her pain regimen.  Assessment & Plan:   Principal Problem:   Cellulitis Active Problems:   Leukocytosis   IVDU (intravenous drug user)   Anxiety and depression   Cellulitis of right ankle   Abscess of tendon sheath, right ankle and foot   Abscess of right foot  Right foot cellulitis and abscess due to S. pyogenes: Concern for septic arthritis in setting of IVDU. s/p I&D 4/12 and repeat I&D, partial closure 4/17 by Stacey Martinez. Blood culture negative and echo without definite vegetation.  - Abx changed to ampicillin after phone discussion with ID on 4/14. Recommendation was reportedly "at least 3 weeks of therapy, IV while  admitted and change to po at discharge." I have asked for formal consultation to inform ongoing antibiotic requirements.  - Cardiology consulted for TEE. May be able to accomplish this 4/19.  - MUST NOT DC WITH MIDLINE IN PLACE - Continue multimodal pain control as ordered including toradol, tylenol, gabapentin, robaxin, po oxycodone. Increased oxycodone dose to 15mg  as I believe she is opioid-tolerant. Will wean off IV morphine over next 24 hours.  - Continue bowel regimen and antiemetics prn.  Supraclavicular fossa abscess: measuring 3x5 cm.Intramuscular extension of abscess into the right sternocleidomastoid muscle with several loculations. s/p I&D 4/12 with culture growing group A strep.  - General surgery recommended dressing change W>D BID, will follow up in clinic in 2 weeks. Will need HH-RN for daily dressing changes.   IVDU, polysubstance abuse, tobacco use: UDS + amphetamines, benzodiazepines,opiates,cocaine, THC. Hepatitis Panel Negative, HIV, and RPR Negative  - Would not continue opioid analgesic at discharge.  - Counseled pt, though will have to continue efforts.  - Nicotine patch ordered.  - PULL MIDLINE PRIOR TO DISCHARGE  Anxiety and depression: Dual diagnosis.  - Continue fluoxetine, quetiapine, trazodone and prn xanax only BID to avoid over sedation.   Thrombocytosis: Reactive.  - Monitor.    Hyperglycemia, prediabetes: HbA1c 5.7%.  - Change to carb-modified diet if hyperglycemia returns.   Normocytic anemia: Stable.   DVT prophylaxis: Enoxaparin 40 mg sq q24h Code Status: FULL CODE Family Communication: No family present at bedside Disposition Plan: Home when stable. PT/OT recommending Home Health OT she will need a rolling walker with 5 inch wheels  in a wheelchair.  Consultants:   Orthopedic Surgery  General Surgery   Discussed Case with ID Stacey Martinez   Procedures:  - Right Abscess Neck I&D. Dr. Derrell Lolling - Right Ankle Debridement and Wound Vac  Placement. Dr. Lajoyce Corners - Repeat debridement of right foot with sharp excision skin and soft tissue muscle and fascia / local tissue rearrangement for partial wound closure 5x5cm / application of split-thickness allograft, wound size 15x7cm. Dr. Lajoyce Corners 11/27/2017   Antimicrobials:  Vancomycin 4/8-4/14 Zosyn 4/10-4/14 Ancef 4/12 and 4/17 Ampicillin 4/14 >>   Subjective: Pain control significantly improved with increased oxycodone dose. Was unable to get CAM walker on due to pain. Cannot bear weight on foot. Right neck area is stable. No fevers, chills, night sweats.   Objective: Vitals:   11/27/17 1136 11/27/17 1140 11/27/17 2037 11/28/17 0430  BP: 117/85 113/74 111/62 113/72  Pulse: 80 83 79 76  Resp: 18 18 19 17   Temp: 98.5 F (36.9 C) 98.3 F (36.8 C) 98.4 F (36.9 C) 98 F (36.7 C)  TempSrc: Oral Oral Oral Oral  SpO2: 95% 98% 97% 98%  Weight:      Height:        Intake/Output Summary (Last 24 hours) at 11/28/2017 1334 Last data filed at 11/28/2017 0600 Gross per 24 hour  Intake 980 ml  Output 920 ml  Net 60 ml   Filed Weights   11/18/17 2152 11/22/17 0946 11/27/17 0853  Weight: 69 kg (152 lb 3.2 oz) 68.9 kg (152 lb) 68.9 kg (152 lb)   Examination: Constitutional: WDWN 36yo F in no distress appearing older than stated age.  Eyes: Sclera anicteric.  Lids and conjunctive are normal. ENMT: MMM, no oral ulcers, poor dentition Neck: Supple with no JVD. Right anterolateral neck with dressing changed today c/d/i Respiratory: Nonlabored and clear bilaterally.   Cardiovascular: Regular rate and rhythm. No m/r/g. No lower extremity edema appreciated Abdomen: Soft, nontender, nondistended.  Bowel sounds present in all 4 quadrants. Musculoskeletal: Has a right arm midline without tenderness, erythema, fluctuance.  Right ankle with lateral wound vac applied over skin graft with staples in place inferiorly. Good suction, ~50cc output serosanguinous. Toes with active ROM, no numbness, 2+  DP pulses pulse.  Skin: Warm and dry, has multiple track marks and scabs on upper extremities.  Neurologic: Cranial nerves II through XII grossly intact.  No appreciable focal deficits. Psychiatric: Normal mood and anxious affect.  Awake and alert.  Intact judgment and insight  Data Reviewed: I have personally reviewed following labs and imaging studies  CBC: Recent Labs  Lab 11/24/17 0437 11/24/17 1923 11/25/17 0657 11/26/17 0527 11/27/17 0524  WBC 12.1* 12.9* 11.2* 12.4* 11.4*  NEUTROABS 8.6* 9.6* 7.7 9.0* 8.1*  HGB 11.4* 11.4* 12.5 11.1* 10.7*  HCT 36.0 35.4* 39.3 35.7* 34.5*  MCV 90.5 89.4 92.0 91.1 91.3  PLT 325 303 518* 519* 517*   Basic Metabolic Panel: Recent Labs  Lab 11/23/17 0523 11/24/17 0437 11/25/17 0657 11/26/17 0527 11/27/17 0524  NA 139 139 140 139 139  K 3.9 3.8 4.2 4.0 4.1  CL 103 104 105 105 105  CO2 27 25 28 26 27   GLUCOSE 106* 104* 101* 98 94  BUN 11 12 10 10 14   CREATININE 0.57 0.71 0.73 0.74 0.93  CALCIUM 8.1* 7.9* 8.2* 8.2* 8.3*  MG  --  2.1 2.2 2.2 2.1  PHOS  --  4.3 4.3 4.4 5.5*   GFR: Estimated Creatinine Clearance: 78.2 mL/min (by C-G formula based  on SCr of 0.93 mg/dL). Liver Function Tests: Recent Labs  Lab 11/23/17 0523 11/24/17 0437 11/25/17 0657 11/26/17 0527 11/27/17 0524  AST 23 30 26 20 20   ALT 26 32 33 28 23  ALKPHOS 90 74 79 74 74  BILITOT 0.2* 0.3 0.2* 0.3 0.4  PROT 5.9* 5.9* 6.3* 5.9* 5.7*  ALBUMIN 1.8* 1.9* 2.1* 2.0* 2.1*   No results for input(s): LIPASE, AMYLASE in the last 168 hours. No results for input(s): AMMONIA in the last 168 hours. Coagulation Profile: No results for input(s): INR, PROTIME in the last 168 hours. Cardiac Enzymes: No results for input(s): CKTOTAL, CKMB, CKMBINDEX, TROPONINI in the last 168 hours. BNP (last 3 results) No results for input(s): PROBNP in the last 8760 hours. HbA1C: No results for input(s): HGBA1C in the last 72 hours. CBG: No results for input(s): GLUCAP in the last  168 hours. Lipid Profile: No results for input(s): CHOL, HDL, LDLCALC, TRIG, CHOLHDL, LDLDIRECT in the last 72 hours. Thyroid Function Tests: No results for input(s): TSH, T4TOTAL, FREET4, T3FREE, THYROIDAB in the last 72 hours. Anemia Panel: No results for input(s): VITAMINB12, FOLATE, FERRITIN, TIBC, IRON, RETICCTPCT in the last 72 hours. Sepsis Labs: No results for input(s): PROCALCITON, LATICACIDVEN in the last 168 hours.  Recent Results (from the past 240 hour(s))  Blood culture (routine x 2)     Status: None   Collection Time: 11/18/17  7:04 PM  Result Value Ref Range Status   Specimen Description   Final    BLOOD LEFT ARM Performed at [facility], [address] [place], [place] [address]    Special Requests   Final    BOTTLES DRAWN AEROBIC AND ANAEROBIC Blood Culture adequate volume Performed at [facility], [address], [place], [place] [address]    Culture   Final    NO GROWTH 5 DAYS Performed at [facility], [address], [place], [place] [address]    Report Status 11/23/2017 FINAL  Final  Blood culture (routine x 2)     Status: None   Collection Time: 11/18/17  7:04 PM  Result Value Ref Range Status   Specimen Description   Final    BLOOD LEFT FOREARM Performed at [facility], [address] [place], [place] [address]    Special Requests   Final    BOTTLES DRAWN AEROBIC AND ANAEROBIC Blood Culture adequate volume Performed at [facility], [address], [place], [place] [address]    Culture   Final    NO GROWTH 5 DAYS Performed at [facility], [address], [place], [place] [address]    Report Status 11/23/2017 FINAL  Final  Surgical pcr screen     Status: Abnormal   Collection Time: 11/20/17 11:20 PM  Result Value Ref Range Status   MRSA, PCR NEGATIVE NEGATIVE Final   Staphylococcus aureus POSITIVE (A) NEGATIVE Final    Comment: (NOTE) The Xpert SA  Assay (FDA approved for NASAL specimens in patients 56 years of age and older), is one component of a comprehensive surveillance program. It is not intended to diagnose infection nor to guide or monitor treatment. Performed at [facility], [address] [place], [place] [address]   Aerobic/Anaerobic Culture (surgical/deep wound)     Status: None   Collection Time: 11/22/17 12:13 PM  Result Value Ref Range Status   Specimen Description FOOT RIGHT ABSCESS  Final   Special Requests NONE  Final   Gram Stain   Final    ABUNDANT WBC PRESENT, PREDOMINANTLY PMN MODERATE GRAM POSITIVE COCCI IN PAIRS IN CHAINS    Culture   Final    FEW GROUP A STREP (S.PYOGENES) ISOLATED NO ANAEROBES ISOLATED Performed at [facility], [address], [place], [place] [address]    Report Status 11/27/2017 FINAL  Final  Aerobic/Anaerobic Culture (surgical/deep wound)     Status: None   Collection Time: 11/22/17 12:14 PM  Result Value Ref Range Status   Specimen Description FOOT RIGHT NECK  Final   Special Requests NONE  Final   Gram Stain   Final    ABUNDANT WBC PRESENT, PREDOMINANTLY PMN FEW GRAM POSITIVE COCCI IN PAIRS IN CHAINS    Culture   Final    RARE GROUP A STREP (S.PYOGENES) ISOLATED NO ANAEROBES ISOLATED Performed at [facility], [address], [place], [place] [address]    Report Status 11/27/2017 FINAL  Final   Radiology Studies: No results found.  Scheduled Meds: . docusate sodium  100 mg Oral BID  . enoxaparin (LOVENOX) injection  40 mg Subcutaneous Q24H  . feeding supplement (ENSURE ENLIVE)  237 mL Oral BID BM  . FLUoxetine  20 mg Oral Daily  . gabapentin  600 mg Oral QID  . mouth rinse  15 mL Mouth Rinse BID  . multivitamin with minerals  1 tablet Oral Daily  . mupirocin ointment   Topical Daily  . nicotine  21 mg Transdermal Daily  . polyethylene glycol  17 g Oral Daily  . QUEtiapine  25-50 mg Oral QHS  . senna  1 tablet Oral BID  . traZODone  150 mg  Oral QHS   Continuous Infusions: . sodium chloride 75 mL/hr at 11/22/17 1400  . sodium chloride 10 mL/hr at 11/22/17 1714  . sodium chloride 10 mL/hr at 11/27/17 1153  . ampicillin (OMNIPEN) IV 2 g (11/28/17 1257)  . lactated ringers 10 mL/hr at 11/27/17 0900  . methocarbamol (ROBAXIN)  IV      LOS: 10 days   Hazeline Junker, Martinez [facility] Pager [phone]  If 7PM-7AM, please contact night-coverage [email or web address] Password [email or web address] 11/28/2017, 1:34 PM

## 2017-11-28 NOTE — Progress Notes (Signed)
Occupational Therapy Treatment Patient Details Name: Stacey Martinez MRN: [id number] DOB: 05/21/1981 Today's Date: 11/28/2017    History of present illness 37 y.o. female with history of IVDA (mostly cocaine, intermittently heroin)-presented with fever, worsening right foot/ankle pain, and erythema/swelling in a area in the lower part of her right neck. She was thought to have soft tissue infection/cellulitis-and started on vancomycin, however in spite of being on antimicrobial therapy for 48 hours, patient continued to have persistent fever, and worsening swelling of the right foot highly concerning for underlying septic arthritis and abscess in the lower part of right neck area. now s/p I&D of neck and R ankle. Plan to go to OR wed 4/17 for another I&D and wound closure.    OT comments  Pt progressing towards OT goals, presents supine in bed agreeable to OT tx session. Pt completing bed mobility with minguard, demonstrating ability to perform sit<>stand and short distance functional mobility within room using RW with overall minguard assist for safety and assist for line management. Pt demonstrating ability to maintain NWB in RLE with mobility, though does require min verbal cues to maintain NWB in RLE when repositioning in bed. Pt completing seated grooming ADLs with setup assist; currently requires MinA for LB ADLs. Feel d/c recommendations remain appropriate at this time pending pt has adequate assist after return home. Will continue to follow to ensure appropriate discharge plan and to progress pt towards established OT goals.   Follow Up Recommendations  Home health OT;Supervision/Assistance - 24 hour(pending available support after discharge)    Equipment Recommendations  None recommended by OT          Precautions / Restrictions Precautions Precautions: Fall Required Braces or Orthoses: Other Brace/Splint Other Brace/Splint: R CAM boot  Restrictions Weight Bearing Restrictions:  Yes RLE Weight Bearing: Non weight bearing       Mobility Bed Mobility Overal bed mobility: Modified Independent                Transfers Overall transfer level: Needs assistance Equipment used: Rolling walker (2 wheeled) Transfers: Sit to/from Stand Sit to Stand: Min guard         General transfer comment: VCs for safe hand placement; minguard for safety     Balance Overall balance assessment: Needs assistance Sitting-balance support: No upper extremity supported Sitting balance-Leahy Scale: Good Sitting balance - Comments: static sitting EOB    Standing balance support: Bilateral upper extremity supported;Single extremity supported Standing balance-Leahy Scale: Poor Standing balance comment: currently reliant on UE support                            ADL either performed or assessed with clinical judgement   ADL Overall ADL's : Needs assistance/impaired     Grooming: Wash/dry face;Oral care;Set up;Bed level               Lower Body Dressing: Sit to/from stand;Minimal assistance Lower Body Dressing Details (indicate cue type and reason): pt donning L sock at bed level with setup assist              Functional mobility during ADLs: Min guard;Rolling walker General ADL Comments: pt completing bed mobility and short distance functional mobility in room using RW; maintains NWB in RLE well, does require cues to ensure NWB in RLE when repositioning in bed; once returned to bed pt completing grooming ADLs with setup assist; min verbal cues for safety during mobility as pt appearing easily  distracted                        Cognition Arousal/Alertness: Awake/alert Behavior During Therapy: WFL for tasks assessed/performed Overall Cognitive Status: Within Functional Limits for tasks assessed                                 General Comments: pt is easily distracted and requires some redirection, but feel this may be pt's  baseline                           Pertinent Vitals/ Pain       Pain Assessment: Faces Faces Pain Scale: Hurts little more Pain Location: R foot Pain Descriptors / Indicators: Discomfort;Grimacing Pain Intervention(s): Monitored during session;Premedicated before session;Repositioned                                                          Frequency  Min 2X/week        Progress Toward Goals  OT Goals(current goals can now be found in the care plan section)  Progress towards OT goals: Progressing toward goals  Acute Rehab OT Goals Patient Stated Goal: Play with her son OT Goal Formulation: With patient Time For Goal Achievement: 12/08/17 Potential to Achieve Goals: Good  Plan Discharge plan remains appropriate                     AM-PAC PT "6 Clicks" Daily Activity     Outcome Measure   Help from another person eating meals?: None Help from another person taking care of personal grooming?: A Little Help from another person toileting, which includes using toliet, bedpan, or urinal?: A Little Help from another person bathing (including washing, rinsing, drying)?: A Little Help from another person to put on and taking off regular upper body clothing?: A Little Help from another person to put on and taking off regular lower body clothing?: A Little 6 Click Score: 19    End of Session Equipment Utilized During Treatment: Gait belt;Rolling walker  OT Visit Diagnosis: Unsteadiness on feet (R26.81);Other abnormalities of gait and mobility (R26.89);Muscle weakness (generalized) (M62.81);Pain Pain - Right/Left: Right Pain - part of body: Leg;Ankle and joints of foot   Activity Tolerance Patient tolerated treatment well   Patient Left in bed;with call bell/phone within reach;Other (comment)(telesitter )   Nurse Communication Mobility status        Time: [phone] OT Time Calculation (min): 37 min  Charges: OT General  Charges $OT Visit: 1 Visit OT Treatments $Self Care/Home Management : 8-22 mins $Therapeutic Activity: 8-22 mins  Marcy Siren, OT Pager [phone] 11/28/2017    Orlando Penner 11/28/2017, 1:01 PM

## 2017-11-28 NOTE — H&P (View-Only) (Signed)
[facility]    Date of Admission:  11/18/2017     Total days of antibiotics 10   Day 5 Ampicillin            Reason for Consult: Foot abscess    Referring Provider: Jarvis Newcomer Primary Care Provider: .  Assessment: IVDA Step abscesses  Plan: 1. Continue prev anbx (ampicilin) 2. Check TEE  Comment-  She denies accessing her neck for IVDA.  Given the same bacteria in separate sites, IVDA, I would favor ruling out IE.  She swears that she will not abuse drugs again give the pain she has gone through. I asked her about NA and changing her environment at d/c.    Principal Problem:   Cellulitis Active Problems:   Leukocytosis   IVDU (intravenous drug user)   Anxiety and depression   Cellulitis of right ankle   Abscess of tendon sheath, right ankle and foot   Abscess of right foot   . docusate sodium  100 mg Oral BID  . enoxaparin (LOVENOX) injection  40 mg Subcutaneous Q24H  . feeding supplement (ENSURE ENLIVE)  237 mL Oral BID BM  . FLUoxetine  20 mg Oral Daily  . gabapentin  600 mg Oral QID  . mouth rinse  15 mL Mouth Rinse BID  . multivitamin with minerals  1 tablet Oral Daily  . mupirocin ointment   Topical Daily  . nicotine  21 mg Transdermal Daily  . polyethylene glycol  17 g Oral Daily  . QUEtiapine  25-50 mg Oral QHS  . senna  1 tablet Oral BID  . traZODone  150 mg Oral QHS    HPI: Stacey Martinez is a 37 y.o. female with past medical history significant for injecting cocaine and mood disorder. We were asked. She presented on 4/8 with worsened wounds on her right inner thigh with previous injection. She noticed swelling of the upper chest wall and right ankle and associated fevers to 103 degrees prior to admission. In the ED she was found to have leukocytosis to 22.8k and tachycardia. Blood cultures were obtained and she received empiric vancomycin and zosyn until 4/14 when she was transitioned to ampicillin.   Right neck CT  revealed right supraclavicular fossa abscess measuring 3x5 cm with intramuscular extension to the sternocleidomastoid muscle with loculations. MRI of the right ankle without evidence for osteomyelitis, only abscess/soft tissue process. On 4/12 she underwent I&D and VAC placement. Due to increased pain she returned for repeated debridement on 4/15. Cultures on 4/12 with group a strep.   Hepatitis C, B and HIV negative.   Review of Systems: Review of Systems  Constitutional: Negative for chills and fever.  Respiratory: Negative for cough.   Gastrointestinal: Negative for constipation and diarrhea.  Genitourinary: Negative for dysuria.  Please see HPI. All other systems reviewed and negative.   Past Medical History:  Diagnosis Date  . Hx of degenerative disc disease   . Substance abuse (HCC)     Social History   Tobacco Use  . Smoking status: Current Every Day Smoker    Types: Cigarettes  Substance Use Topics  . Alcohol use: Not Currently  . Drug use: Yes    History reviewed. No pertinent family history. Allergies  Allergen Reactions  . Phenytoin Sodium Extended Other (See Comments)    Seizure Fd&c Yellow #6-na Benzoate-phenytoin  . Yellow Dye Other (See Comments)    Seizures Fd&c Yellow #6-na Benzoate-phenytoin  .  Doxycycline Rash    blisters Fixed drug eruption December 2018    OBJECTIVE: Blood pressure 113/72, pulse 76, temperature 98 F (36.7 C), temperature source Oral, resp. rate 17, height 5\' 4"  (1.626 m), weight 152 lb (68.9 kg), SpO2 98 %.  Physical Exam  Constitutional: She is oriented to person, place, and time. She appears well-developed and well-nourished.  HENT:  Mouth/Throat: No oropharyngeal exudate.  Eyes: Pupils are equal, round, and reactive to light. EOM are normal.  Cardiovascular: Normal rate, regular rhythm and normal heart sounds.  Pulmonary/Chest: Effort normal and breath sounds normal.  Abdominal: Soft. Bowel sounds are normal. She exhibits  no distension. There is no tenderness.  Musculoskeletal:       Arms:      Feet:  Neurological: She is alert and oriented to person, place, and time.    Lab Results Lab Results  Component Value Date   WBC 11.4 (H) 11/27/2017   HGB 10.7 (L) 11/27/2017   HCT 34.5 (L) 11/27/2017   MCV 91.3 11/27/2017   PLT 517 (H) 11/27/2017    Lab Results  Component Value Date   CREATININE 0.93 11/27/2017   BUN 14 11/27/2017   NA 139 11/27/2017   K 4.1 11/27/2017   CL 105 11/27/2017   CO2 27 11/27/2017    Lab Results  Component Value Date   ALT 23 11/27/2017   AST 20 11/27/2017   ALKPHOS 74 11/27/2017   BILITOT 0.4 11/27/2017     Microbiology: Recent Results (from the past 240 hour(s))  Blood culture (routine x 2)     Status: None   Collection Time: 11/18/17  7:04 PM  Result Value Ref Range Status   Specimen Description   Final    BLOOD LEFT ARM Performed at [facility], [address] [place], [place] [address]    Special Requests   Final    BOTTLES DRAWN AEROBIC AND ANAEROBIC Blood Culture adequate volume Performed at [facility], [address] [place], [place] [address]    Culture   Final    NO GROWTH 5 DAYS Performed at [facility], [address] [place], [place] [address]    Report Status 11/23/2017 FINAL  Final  Blood culture (routine x 2)     Status: None   Collection Time: 11/18/17  7:04 PM  Result Value Ref Range Status   Specimen Description   Final    BLOOD LEFT FOREARM Performed at [facility], [address] [place], [place] [address]    Special Requests   Final    BOTTLES DRAWN AEROBIC AND ANAEROBIC Blood Culture adequate volume Performed at [facility], [address] [place], [place] [address]    Culture   Final    NO GROWTH 5 DAYS Performed at [facility], [address] [place], [place] [address]    Report Status 11/23/2017 FINAL  Final  Surgical pcr  screen     Status: Abnormal   Collection Time: 11/20/17 11:20 PM  Result Value Ref Range Status   MRSA, PCR NEGATIVE NEGATIVE Final   Staphylococcus aureus POSITIVE (A) NEGATIVE Final    Comment: (NOTE) The Xpert SA Assay (FDA approved for NASAL specimens in patients 39 years of age and older), is one component of a comprehensive surveillance program. It is not intended to diagnose infection nor to guide or monitor treatment. Performed at [facility], [address] [place], [place] [address]  Aerobic/Anaerobic Culture (surgical/deep wound)     Status: None   Collection Time: 11/22/17 12:13 PM  Result Value Ref Range Status   Specimen Description FOOT RIGHT ABSCESS  Final   Special Requests NONE  Final   Gram Stain   Final    ABUNDANT WBC PRESENT, PREDOMINANTLY PMN MODERATE GRAM POSITIVE COCCI IN PAIRS IN CHAINS    Culture   Final    FEW GROUP A STREP (S.PYOGENES) ISOLATED NO ANAEROBES ISOLATED Performed at [facility], [address] [place], [place] [address]    Report Status 11/27/2017 FINAL  Final  Aerobic/Anaerobic Culture (surgical/deep wound)     Status: None   Collection Time: 11/22/17 12:14 PM  Result Value Ref Range Status   Specimen Description FOOT RIGHT NECK  Final   Special Requests NONE  Final   Gram Stain   Final    ABUNDANT WBC PRESENT, PREDOMINANTLY PMN FEW GRAM POSITIVE COCCI IN PAIRS IN CHAINS    Culture   Final    RARE GROUP A STREP (S.PYOGENES) ISOLATED NO ANAEROBES ISOLATED Performed at [facility], [address] [place], [place] [address]    Report Status 11/27/2017 FINAL  Final    Rexene Alberts, MSN, NP-C [facility] Cell: [phone] Pager: [phone]  11/28/2017 4:16 PM

## 2017-11-29 ENCOUNTER — Inpatient Hospital Stay (HOSPITAL_COMMUNITY): Payer: Medicaid Other | Admitting: Anesthesiology

## 2017-11-29 ENCOUNTER — Inpatient Hospital Stay (HOSPITAL_COMMUNITY): Payer: Medicaid Other

## 2017-11-29 ENCOUNTER — Encounter (HOSPITAL_COMMUNITY)
Admission: EM | Disposition: A | Discharge status: Home / Self Care | Payer: Self-pay | Source: Home / Self Care | Attending: Family Medicine

## 2017-11-29 ENCOUNTER — Encounter (HOSPITAL_COMMUNITY): Payer: Self-pay

## 2017-11-29 DIAGNOSIS — F1910 Other psychoactive substance abuse, uncomplicated: Secondary | ICD-10-CM

## 2017-11-29 DIAGNOSIS — R7881 Bacteremia: Secondary | ICD-10-CM

## 2017-11-29 DIAGNOSIS — L02415 Cutaneous abscess of right lower limb: Secondary | ICD-10-CM

## 2017-11-29 HISTORY — PX: TEE WITHOUT CARDIOVERSION: SHX5443

## 2017-11-29 SURGERY — ECHOCARDIOGRAM, TRANSESOPHAGEAL
Anesthesia: Monitor Anesthesia Care

## 2017-11-29 MED ORDER — SODIUM CHLORIDE 0.9 % IV SOLN
INTRAVENOUS | Status: DC | PRN
  Administered 2017-11-29: 13:00:00 via INTRAVENOUS

## 2017-11-29 MED ORDER — PROPOFOL 500 MG/50ML IV EMUL
INTRAVENOUS | Status: DC | PRN
  Administered 2017-11-29: 75 ug/kg/min via INTRAVENOUS

## 2017-11-29 MED ORDER — PROPOFOL 10 MG/ML IV BOLUS
INTRAVENOUS | Status: DC | PRN
  Administered 2017-11-29: 20 mg via INTRAVENOUS
  Administered 2017-11-29: 40 mg via INTRAVENOUS

## 2017-11-29 MED ORDER — SODIUM CHLORIDE 0.9 % IV SOLN
INTRAVENOUS | Status: DC
  Administered 2017-11-29: 13:00:00 via INTRAVENOUS

## 2017-11-29 MED ORDER — LIDOCAINE HCL (CARDIAC) PF 100 MG/5ML IV SOSY
PREFILLED_SYRINGE | INTRAVENOUS | Status: DC | PRN
  Administered 2017-11-29: 40 mg via INTRATRACHEAL

## 2017-11-29 NOTE — Anesthesia Postprocedure Evaluation (Signed)
Anesthesia Post Note  Patient: Stacey Martinez  Procedure(s) Performed: TRANSESOPHAGEAL ECHOCARDIOGRAM (TEE) (N/A )     Patient location during evaluation: Endoscopy Anesthesia Type: MAC Level of consciousness: awake and alert Pain management: pain level controlled Vital Signs Assessment: post-procedure vital signs reviewed and stable Respiratory status: spontaneous breathing, nonlabored ventilation, respiratory function stable and patient connected to nasal cannula oxygen Cardiovascular status: stable and blood pressure returned to baseline Postop Assessment: no apparent nausea or vomiting Anesthetic complications: no    Last Vitals:  Vitals:   11/29/17 1430 11/29/17 1437  BP:  (!) 102/50  Pulse: 65 65  Resp: 13 11  Temp:    SpO2: 99% 98%    Last Pain:  Vitals:   11/29/17 1437  TempSrc:   PainSc: 0-No pain                 Safal Halderman

## 2017-11-29 NOTE — Anesthesia Procedure Notes (Signed)
Procedure Name: MAC Date/Time: 11/29/2017 2:08 PM Performed by: Neldon Newport, CRNA Pre-anesthesia Checklist: Timeout performed, Patient being monitored, Suction available, Emergency Drugs available and Patient identified Patient Re-evaluated:Patient Re-evaluated prior to induction Oxygen Delivery Method: Nasal cannula Preoxygenation: Pre-oxygenation with 100% oxygen

## 2017-11-29 NOTE — Transfer of Care (Addendum)
Immediate Anesthesia Transfer of Care Note  Patient: Stacey Martinez  Procedure(s) Performed: TRANSESOPHAGEAL ECHOCARDIOGRAM (TEE) (N/A )  Patient Location: Endoscopy Unit  Anesthesia Type:MAC  Level of Consciousness: awake, alert  and oriented  Airway & Oxygen Therapy: Patient Spontanous Breathing and Patient connected to nasal cannula oxygen  Post-op Assessment: Report given to RN, Post -op Vital signs reviewed and stable and Patient moving all extremities X 4  Post vital signs: Reviewed and stable  Last Vitals:  Vitals Value Taken Time  BP    Temp    Pulse    Resp    SpO2      Last Pain:  Vitals:   11/29/17 1321  TempSrc: Oral  PainSc: 10-Worst pain ever      Patients Stated Pain Goal: 3 (39/58/44 1712)  Complications: No apparent anesthesia complications

## 2017-11-29 NOTE — CV Procedure (Signed)
TEE  Patient sedated by anesthesia with Propofol IV  TEE probe advanced to mid esophagus without difficulty  Findings:  MV normal  Trace MR TV normal  Triv TR AV normal   PV normal  LA, LAA without masses No PFO by color doppler or with injection of agitated saline   LVEF and RVEF normal  Thoracic aorta normal   Full report to follow.

## 2017-11-29 NOTE — Anesthesia Preprocedure Evaluation (Signed)
Anesthesia Evaluation  Patient identified by MRN, date of birth, ID band Patient awake    Reviewed: Allergy & Precautions, NPO status , Patient's Chart, lab work & pertinent test results  History of Anesthesia Complications Negative for: history of anesthetic complications  Airway Mallampati: III  TM Distance: >3 FB Neck ROM: Full    Dental  (+) Edentulous Upper   Pulmonary Current Smoker,    Pulmonary exam normal breath sounds clear to auscultation       Cardiovascular negative cardio ROS   Rhythm:Regular Rate:Normal  ECG: SR, rate 84  ECHO: LV EF: 55% - 60% LVEF 55-60%, mild LVH, normal wall motion, aortic sclerosis with increased valvular velocity, trivial MR, normal atrial size, normal IVC. There was no obvious vegetation.     Neuro/Psych PSYCHIATRIC DISORDERS Anxiety negative neurological ROS     GI/Hepatic negative GI ROS, (+)     substance abuse  cocaine use, marijuana use, methamphetamine use and IV drug use,   Endo/Other  negative endocrine ROS  Renal/GU negative Renal ROS     Musculoskeletal negative musculoskeletal ROS (+)   Abdominal   Peds  Hematology  (+) anemia ,   Anesthesia Other Findings Abscess Right Ankle  Reproductive/Obstetrics                             Anesthesia Physical Anesthesia Plan  ASA: III  Anesthesia Plan:    Post-op Pain Management:    Induction:   PONV Risk Score and Plan: 1 and Treatment may vary due to age or medical condition  Airway Management Planned: Nasal Cannula  Additional Equipment: None  Intra-op Plan:   Post-operative Plan:   Informed Consent: I have reviewed the patients History and Physical, chart, labs and discussed the procedure including the risks, benefits and alternatives for the proposed anesthesia with the patient or authorized representative who has indicated his/her understanding and acceptance.   Dental  advisory given  Plan Discussed with: CRNA and Surgeon  Anesthesia Plan Comments:         Anesthesia Quick Evaluation

## 2017-11-29 NOTE — Progress Notes (Signed)
PROGRESS NOTE    Stacey Stacey Martinez  JXB:[id number] DOB: 12/17/1980 DOA: 11/18/2017 PCP: Stacey SolomonsLee, David E, MD   Brief Narrative:  Brief Narrative   37 y.o.femalewith history of IVDA (mostly cocaine, intermittently heroin)-presented with fever, worsening right foot/ankle pain, and erythema/swelling in a area in the lower part of her right neck. She was thought to have soft tissue infection/cellulitis-and started on vancomycin,however in spite of being on antimicrobial therapy for 48 hours,patient continuedto have persistent fever, and worsening swelling of the right foot highly concerning for underlying septic arthritis andabscess in the lower part of right neck area.  [facility] by Dr Stacey Martinez and it was done on 4/12 along with I and D of Neck Abscess by Dr. Derrell Martinez.   Stacey MortimerYesterdat AM she was seen in bed resting; Nurse paged this Afternoon stating that when he was doing the dressing change, neck wound was profusely bleeding and stopped eventually after pressure was held for at least 5-10 minutes. General Surgery evaluated and felt no intervention was necessary.  Patient is awaiting repeat I&D and wound closure of the right ankle to be done Wednesday 4/17.  This a.m. patient was complaining of intractable pain so IV ketorolac was added to her pain regimen.  Assessment & Plan:   Principal Problem:   Cellulitis Active Problems:   Leukocytosis   IVDU (intravenous drug user)   Anxiety and depression   Cellulitis of right ankle   Abscess of tendon sheath, right ankle and foot   Abscess of right foot  Right foot cellulitis and abscess due to S. pyogenes: Concern for septic arthritis in setting of IVDU. s/p I&D 4/12 and repeat I&D, partial closure 4/17 by Dr. Lajoyce Martinez. Blood culture negative and echo without definite vegetation.  - Continue ampicillin per ID  - Cardiology consulted for TEE, negative 4/19. - MUST NOT DC WITH MIDLINE IN PLACE - Continue multimodal  pain control as ordered including toradol, tylenol, gabapentin, robaxin, po oxycodone. Increased oxycodone dose to 15mg  as I believe she is opioid-tolerant. DC IV morphine after tonight per discussion with patient.  - Continue bowel regimen and antiemetics prn. - If erythema continues to spread, would need ortho reeval inpatient.  - Trend leukocytosis in AM.  Supraclavicular fossa abscess: measuring 3x5 cm.Intramuscular extension of abscess into the right sternocleidomastoid muscle with several loculations. s/p I&D 4/12 with culture growing group A strep.  - General surgery recommended dressing change W>D BID, will follow up in clinic in 2 weeks. Will need HH-RN for daily dressing changes.   IVDU, polysubstance abuse, tobacco use: UDS + amphetamines, benzodiazepines,opiates,cocaine, THC. Hepatitis Panel Negative, HIV, and RPR Negative  - Would not continue opioid analgesic at discharge.  - Counseled pt, though will have to continue efforts.  - Nicotine patch ordered.  - PULL MIDLINE PRIOR TO DISCHARGE  Anxiety and depression: Dual diagnosis.  - Continue fluoxetine, quetiapine, trazodone and prn xanax only BID to avoid over sedation.   Thrombocytosis: Reactive.  - Monitor.    Hyperglycemia, prediabetes: HbA1c 5.7%.  - Change to carb-modified diet if hyperglycemia returns.   Normocytic anemia: Stable.   DVT prophylaxis: Enoxaparin Code Status: FULL CODE Family Communication: No family present at bedside Disposition Plan: Home when stable. PT/OT recommending Home Health OT she will need a rolling walker with 5 inch wheels in a wheelchair.  Consultants:   Orthopedic Surgery  General Surgery   ID  Procedures:  - Right Abscess Neck I&D. Dr. Derrell Martinez - Right Ankle Debridement  and Wound Vac Placement. Dr. Lajoyce Corners - Repeat debridement of right foot with sharp excision skin and soft tissue muscle and fascia / local tissue rearrangement for partial wound closure 5x5cm / application  of split-thickness allograft, wound size 15x7cm. Dr. Lajoyce Corners 11/27/2017 - TEE  Patient sedated by anesthesia with Propofol IV  TEE probe advanced to mid esophagus without difficulty  Findings:  MV normal  Trace MR TV normal  Triv TR AV normal   PV normal  LA, LAA without masses No PFO by color doppler or with injection of agitated saline   LVEF and RVEF normal  Thoracic aorta normal    Antimicrobials:  Vancomycin 4/8-4/14 Zosyn 4/10-4/14 Ancef 4/12 and 4/17 Ampicillin 4/14 >>   Subjective: Starving from not eating all day in prep for TEE. No other complaints.   Objective: Vitals:   11/29/17 1427 11/29/17 1430 11/29/17 1437 11/29/17 1508  BP: 92/61  (!) 102/50 106/62  Pulse: 68 65 65 77  Resp: 14 13 11    Temp: 97.6 F (36.4 C)   97.9 F (36.6 C)  TempSrc: Oral   Oral  SpO2: 98% 99% 98% 100%  Weight:      Height:        Intake/Output Summary (Last 24 hours) at 11/29/2017 1549 Last data filed at 11/29/2017 1417 Gross per 24 hour  Intake 504.27 ml  Output 0 ml  Net 504.27 ml   Filed Weights   11/22/17 0946 11/27/17 0853 11/29/17 1321  Weight: 68.9 kg (152 lb) 68.9 kg (152 lb) 68.9 kg (152 lb)   Examination: Constitutional: WDWN 36yo F in no distress appearing older than stated age.  Eyes: Sclera anicteric.  Lids and conjunctive are normal. ENMT: MMM, no oral ulcers, poor dentition Neck: Supple with no JVD. Right supraclavicular fossa with dressing and packed. c/d/i Respiratory: Nonlabored and clear bilaterally.   Cardiovascular: Regular rate and rhythm. No m/r/g. No lower extremity edema appreciated Abdomen: Soft, nontender, nondistended.  Bowel sounds present in all 4 quadrants. Musculoskeletal: Has a right arm midline without tenderness, erythema, fluctuance.  Right ankle with lateral wound vac applied over skin graft with staples in place inferiorly. Some erythema as well. Good suction, ~50cc output serosanguinous. Toes with active ROM, no numbness,  2+ DP pulses pulse.  Skin: Warm and dry, has multiple track marks and scabs on upper extremities.  Neurologic: Cranial nerves II through XII grossly intact.  No appreciable focal deficits. Psychiatric: Normal mood and anxious affect.  Awake and alert.  Intact judgment and insight  Data Reviewed: I have personally reviewed following labs and imaging studies  CBC: Recent Labs  Lab 11/24/17 0437 11/24/17 1923 11/25/17 0657 11/26/17 0527 11/27/17 0524  WBC 12.1* 12.9* 11.2* 12.4* 11.4*  NEUTROABS 8.6* 9.6* 7.7 9.0* 8.1*  HGB 11.4* 11.4* 12.5 11.1* 10.7*  HCT 36.0 35.4* 39.3 35.7* 34.5*  MCV 90.5 89.4 92.0 91.1 91.3  PLT 325 303 518* 519* 517*   Basic Metabolic Panel: Recent Labs  Lab 11/23/17 0523 11/24/17 0437 11/25/17 0657 11/26/17 0527 11/27/17 0524  NA 139 139 140 139 139  K 3.9 3.8 4.2 4.0 4.1  CL 103 104 105 105 105  CO2 27 25 28 26 27   GLUCOSE 106* 104* 101* 98 94  BUN 11 12 10 10 14   CREATININE 0.57 0.71 0.73 0.74 0.93  CALCIUM 8.1* 7.9* 8.2* 8.2* 8.3*  MG  --  2.1 2.2 2.2 2.1  PHOS  --  4.3 4.3 4.4 5.5*   GFR: Estimated Creatinine  Clearance: 78.2 mL/min (by C-G formula based on SCr of 0.93 mg/dL). Liver Function Tests: Recent Labs  Lab 11/23/17 0523 11/24/17 0437 11/25/17 0657 11/26/17 0527 11/27/17 0524  AST 23 30 26 20 20   ALT 26 32 33 28 23  ALKPHOS 90 74 79 74 74  BILITOT 0.2* 0.3 0.2* 0.3 0.4  PROT 5.9* 5.9* 6.3* 5.9* 5.7*  ALBUMIN 1.8* 1.9* 2.1* 2.0* 2.1*   Recent Results (from the past 240 hour(s))  Surgical pcr screen     Status: Abnormal   Collection Time: 11/20/17 11:20 PM  Result Value Ref Range Status   MRSA, PCR NEGATIVE NEGATIVE Final   Staphylococcus aureus POSITIVE (A) NEGATIVE Final    Comment: (NOTE) The Xpert SA Assay (FDA approved for NASAL specimens in patients 31 years of age and older), is one component of a comprehensive surveillance program. It is not intended to diagnose infection nor to guide or monitor  treatment. Performed at [facility], [address] [place], [place] [address]   Aerobic/Anaerobic Culture (surgical/deep wound)     Status: None   Collection Time: 11/22/17 12:13 PM  Result Value Ref Range Status   Specimen Description FOOT RIGHT ABSCESS  Final   Special Requests NONE  Final   Gram Stain   Final    ABUNDANT WBC PRESENT, PREDOMINANTLY PMN MODERATE GRAM POSITIVE COCCI IN PAIRS IN CHAINS    Culture   Final    FEW GROUP A STREP (S.PYOGENES) ISOLATED NO ANAEROBES ISOLATED Performed at [facility], [address], [place], [place] [address]    Report Status 11/27/2017 FINAL  Final  Aerobic/Anaerobic Culture (surgical/deep wound)     Status: None   Collection Time: 11/22/17 12:14 PM  Result Value Ref Range Status   Specimen Description FOOT RIGHT NECK  Final   Special Requests NONE  Final   Gram Stain   Final    ABUNDANT WBC PRESENT, PREDOMINANTLY PMN FEW GRAM POSITIVE COCCI IN PAIRS IN CHAINS    Culture   Final    RARE GROUP A STREP (S.PYOGENES) ISOLATED NO ANAEROBES ISOLATED Performed at [facility], [address] [place], [place] [address]    Report Status 11/27/2017 FINAL  Final   Radiology Studies: No results found.  Scheduled Meds: . docusate sodium  100 mg Oral BID  . enoxaparin (LOVENOX) injection  40 mg Subcutaneous Q24H  . feeding supplement (ENSURE ENLIVE)  237 mL Oral BID BM  . FLUoxetine  20 mg Oral Daily  . gabapentin  600 mg Oral QID  . mouth rinse  15 mL Mouth Rinse BID  . multivitamin with minerals  1 tablet Oral Daily  . mupirocin ointment   Topical Daily  . nicotine  21 mg Transdermal Daily  . polyethylene glycol  17 g Oral Daily  . QUEtiapine  25-50 mg Oral QHS  . senna  1 tablet Oral BID  . traZODone  150 mg Oral QHS   Continuous Infusions: . sodium chloride 75 mL/hr at 11/22/17 1400  . sodium chloride 10 mL/hr at 11/22/17 1714  . sodium chloride 10 mL/hr at 11/27/17 1153  . ampicillin (OMNIPEN) IV 2 g  (11/29/17 1515)  . lactated ringers 10 mL/hr at 11/27/17 0900  . methocarbamol (ROBAXIN)  IV      LOS: 11 days   Hazeline Junker, MD [facility] Pager [phone]  If 7PM-7AM, please contact night-coverage [email or web address] Password [facility] 11/29/2017, 3:49 PM

## 2017-11-29 NOTE — Progress Notes (Signed)
INFECTIOUS DISEASE PROGRESS NOTE  ID: Stacey BelfastStacy Martinez is a 37 y.o. female with  Principal Problem:   Cellulitis Active Problems:   Leukocytosis   IVDU (intravenous drug user)   Anxiety and depression   Cellulitis of right ankle   Abscess of tendon sheath, right ankle and foot   Abscess of right foot  Subjective: No complaints. Eating.   Abtx:  Anti-infectives (From admission, onward)   Start     Dose/Rate Route Frequency Ordered Stop   11/27/17 0600  ceFAZolin (ANCEF) IVPB 2g/100 mL premix     2 g 200 mL/hr over 30 Minutes Intravenous On call to O.R. 11/26/17 2012 11/27/17 1044   11/24/17 2100  ampicillin (OMNIPEN) 2 g in sodium chloride 0.9 % 100 mL IVPB     2 g 300 mL/hr over 20 Minutes Intravenous Every 4 hours 11/24/17 1622     11/24/17 1700  ampicillin (OMNIPEN) 2 g in sodium chloride 0.9 % 100 mL IVPB  Status:  Discontinued     2 g 300 mL/hr over 20 Minutes Intravenous Every 4 hours 11/24/17 1619 11/24/17 1622   11/23/17 2238  piperacillin-tazobactam (ZOSYN) IVPB 3.375 g  Status:  Discontinued     3.375 g 12.5 mL/hr over 240 Minutes Intravenous Every 8 hours 11/23/17 2238 11/24/17 1619   11/22/17 1000  ceFAZolin (ANCEF) IVPB 2g/100 mL premix     2 g 200 mL/hr over 30 Minutes Intravenous To Short Stay 11/22/17 0445 11/22/17 1203   11/21/17 1818  piperacillin-tazobactam (ZOSYN) IVPB 3.375 g  Status:  Discontinued     3.375 g 12.5 mL/hr over 240 Minutes Intravenous Every 8 hours 11/21/17 1205 11/23/17 2238   11/20/17 1600  vancomycin (VANCOCIN) IVPB 1000 mg/200 mL premix  Status:  Discontinued     1,000 mg 200 mL/hr over 60 Minutes Intravenous Every 12 hours 11/20/17 0755 11/24/17 1619   11/20/17 1400  piperacillin-tazobactam (ZOSYN) IVPB 3.375 g  Status:  Discontinued     3.375 g 12.5 mL/hr over 240 Minutes Intravenous Every 8 hours 11/20/17 1237 11/21/17 1205   11/19/17 0400  vancomycin (VANCOCIN) IVPB 750 mg/150 ml premix  Status:  Discontinued     750 mg 150  mL/hr over 60 Minutes Intravenous Every 12 hours 11/18/17 2207 11/20/17 0755   11/18/17 1800  vancomycin (VANCOCIN) IVPB 1000 mg/200 mL premix     1,000 mg 200 mL/hr over 60 Minutes Intravenous  Once 11/18/17 1758 11/18/17 2021      Medications:  Scheduled: . docusate sodium  100 mg Oral BID  . enoxaparin (LOVENOX) injection  40 mg Subcutaneous Q24H  . feeding supplement (ENSURE ENLIVE)  237 mL Oral BID BM  . FLUoxetine  20 mg Oral Daily  . gabapentin  600 mg Oral QID  . mouth rinse  15 mL Mouth Rinse BID  . multivitamin with minerals  1 tablet Oral Daily  . mupirocin ointment   Topical Daily  . nicotine  21 mg Transdermal Daily  . polyethylene glycol  17 g Oral Daily  . QUEtiapine  25-50 mg Oral QHS  . senna  1 tablet Oral BID  . traZODone  150 mg Oral QHS    Objective: Vital signs in last 24 hours: Temp:  [97.6 F (36.4 C)-98.6 F (37 C)] 97.9 F (36.6 C) (04/19 1508) Pulse Rate:  [65-79] 77 (04/19 1508) Resp:  [11-18] 11 (04/19 1437) BP: (92-136)/(50-70) 106/62 (04/19 1508) SpO2:  [98 %-100 %] 100 % (04/19 1508) Weight:  [68.9 kg (  152 lb)] 68.9 kg (152 lb) (04/19 1321)   General appearance: alert, cooperative and no distress Resp: clear to auscultation bilaterally Cardio: regular rate and rhythm GI: normal findings: bowel sounds normal and soft, non-tender Incision/Wound: R foot vac wound. R neck dressed.   Lab Results Recent Labs    11/27/17 0524  WBC 11.4*  HGB 10.7*  HCT 34.5*  NA 139  K 4.1  CL 105  CO2 27  BUN 14  CREATININE 0.93   Liver Panel Recent Labs    11/27/17 0524  PROT 5.7*  ALBUMIN 2.1*  AST 20  ALT 23  ALKPHOS 74  BILITOT 0.4   Sedimentation Rate No results for input(s): ESRSEDRATE in the last 72 hours. C-Reactive Protein No results for input(s): CRP in the last 72 hours.  Microbiology: Recent Results (from the past 240 hour(s))  Surgical pcr screen     Status: Abnormal   Collection Time: 11/20/17 11:20 PM  Result Value  Ref Range Status   MRSA, PCR NEGATIVE NEGATIVE Final   Staphylococcus aureus POSITIVE (A) NEGATIVE Final    Comment: (NOTE) The Xpert SA Assay (FDA approved for NASAL specimens in patients 94 years of age and older), is one component of a comprehensive surveillance program. It is not intended to diagnose infection nor to guide or monitor treatment. Performed at [facility], [address], [place], [place] [address]   Aerobic/Anaerobic Culture (surgical/deep wound)     Status: None   Collection Time: 11/22/17 12:13 PM  Result Value Ref Range Status   Specimen Description FOOT RIGHT ABSCESS  Final   Special Requests NONE  Final   Gram Stain   Final    ABUNDANT WBC PRESENT, PREDOMINANTLY PMN MODERATE GRAM POSITIVE COCCI IN PAIRS IN CHAINS    Culture   Final    FEW GROUP A STREP (S.PYOGENES) ISOLATED NO ANAEROBES ISOLATED Performed at [facility], [address], [place], [place] [address]    Report Status 11/27/2017 FINAL  Final  Aerobic/Anaerobic Culture (surgical/deep wound)     Status: None   Collection Time: 11/22/17 12:14 PM  Result Value Ref Range Status   Specimen Description FOOT RIGHT NECK  Final   Special Requests NONE  Final   Gram Stain   Final    ABUNDANT WBC PRESENT, PREDOMINANTLY PMN FEW GRAM POSITIVE COCCI IN PAIRS IN CHAINS    Culture   Final    RARE GROUP A STREP (S.PYOGENES) ISOLATED NO ANAEROBES ISOLATED Performed at [facility], [address] [place], [place] [address]    Report Status 11/27/2017 FINAL  Final    Studies/Results: No results found.   Assessment/Plan: IVDA Strep abscesses  Total days of antibiotics: 11 (6 amp)  Her TEE is negative When she is ready for d/c, would transition her to keflex 500mg  bid for 2 additional weeks.  Available as needed         Johny Sax MD, FACP Infectious Diseases (pager) [phone] [email or web address] 11/29/2017, 5:00 PM  LOS: 11 days

## 2017-11-29 NOTE — Interval H&P Note (Signed)
History and Physical Interval Note:  11/29/2017 11:55 AM  Stacey Martinez  has presented today for surgery, with the diagnosis of BACTEREMIA  The various methods of treatment have been discussed with the patient and family. After consideration of risks, benefits and other options for treatment, the patient has consented to  Procedure(s): TRANSESOPHAGEAL ECHOCARDIOGRAM (TEE) (N/A) as a surgical intervention .  The patient's history has been reviewed, patient examined, no change in status, stable for surgery.  I have reviewed the patient's chart and labs.  Questions were answered to the patient's satisfaction.     Dietrich PatesPaula Areej Tayler

## 2017-11-29 NOTE — Progress Notes (Signed)
Physical Therapy Treatment Patient Details Name: Stacey Martinez MRN: [id number] DOB: [birth year] Today's Date: 11/29/2017    History of Present Illness 37 y.o. female with history of IVDA (mostly cocaine, intermittently heroin)-presented with fever, worsening right foot/ankle pain, and erythema/swelling in a area in the lower part of her right neck. She was thought to have soft tissue infection/cellulitis-and started on vancomycin, however in spite of being on antimicrobial therapy for 48 hours, patient continued to have persistent fever, and worsening swelling of the right foot highly concerning for underlying septic arthritis and abscess in the lower part of right neck area. now s/p I&D of neck and R ankle. Plan to go to OR wed 4/17 for another I&D and wound closure.     PT Comments    Today's skilled session focused on gait training with RW while adhering to WB precautions. Pt anxious about procedure this afternoon and not wanting to "over do it". Pt requesting WC mobility training as she will require WC at d/c. Plan to bring WC next session. Spoke with evaluating PT to have WC mobilities added to goals. Will continue to follow acutely to maximize pt's functional independence and activity tolerance.    Follow Up Recommendations  Home health PT;Supervision for mobility/OOB     Equipment Recommendations  Rolling walker with 5" wheels;Wheelchair (measurements PT)    Recommendations for Other Services OT consult     Precautions / Restrictions Precautions Precautions: Fall Required Braces or Orthoses: Other Brace/Splint Other Brace/Splint: R CAM boot  Restrictions Weight Bearing Restrictions: Yes LUE Weight Bearing: Weight bearing as tolerated RLE Weight Bearing: Non weight bearing    Mobility  Bed Mobility Overal bed mobility: Modified Independent                Transfers Overall transfer level: Needs assistance Equipment used: Rolling walker (2 wheeled) Transfers: Sit  to/from Stand Sit to Stand: Min guard         General transfer comment: min guard for safety  Ambulation/Gait Ambulation/Gait assistance: Min guard Ambulation Distance (Feet): 45 Feet Assistive device: Rolling walker (2 wheeled) Gait Pattern/deviations: Antalgic;Step-to pattern(hop to) Gait velocity: decreased   General Gait Details: Pt able to adhear to NWB with use of RW. Min guard for safety. No LOB noted.   Stairs             Wheelchair Mobility    Modified Rankin (Stroke Patients Only)       Balance Overall balance assessment: Needs assistance Sitting-balance support: No upper extremity supported Sitting balance-Leahy Scale: Good Sitting balance - Comments: static sitting EOB    Standing balance support: Bilateral upper extremity supported;Single extremity supported Standing balance-Leahy Scale: Poor Standing balance comment: currently reliant on UE support                             Cognition Arousal/Alertness: Awake/alert Behavior During Therapy: WFL for tasks assessed/performed Overall Cognitive Status: Within Functional Limits for tasks assessed                                        Exercises      General Comments        Pertinent Vitals/Pain Pain Assessment: Faces Faces Pain Scale: Hurts little more Pain Location: R foot Pain Descriptors / Indicators: Discomfort;Grimacing Pain Intervention(s): Limited activity within patient's tolerance;Monitored during session    Home  Living                      Prior Function            PT Goals (current goals can now be found in the care plan section) Acute Rehab PT Goals Patient Stated Goal: Play with her son PT Goal Formulation: With patient/family Time For Goal Achievement: 11/30/17 Potential to Achieve Goals: Good Progress towards PT goals: Progressing toward goals    Frequency    Min 3X/week      PT Plan Current plan remains appropriate     Co-evaluation              AM-PAC PT "6 Clicks" Daily Activity  Outcome Measure  Difficulty turning over in bed (including adjusting bedclothes, sheets and blankets)?: None Difficulty moving from lying on back to sitting on the side of the bed? : None Difficulty sitting down on and standing up from a chair with arms (e.g., wheelchair, bedside commode, etc,.)?: None Help needed moving to and from a bed to chair (including a wheelchair)?: A Little Help needed walking in hospital room?: A Little Help needed climbing 3-5 steps with a railing? : A Lot 6 Click Score: 20    End of Session Equipment Utilized During Treatment: Gait belt Activity Tolerance: Patient tolerated treatment well Patient left: in bed;with call bell/phone within reach Nurse Communication: Mobility status PT Visit Diagnosis: Pain;Unsteadiness on feet (R26.81);Other abnormalities of gait and mobility (R26.89) Pain - Right/Left: Right Pain - part of body: Ankle and joints of foot     Time: [id number] PT Time Calculation (min) (ACUTE ONLY): 20 min  Charges:  $Gait Training: 8-22 mins                    G Codes:       Kallie LocksHannah Efton Thomley, VirginiaPTA Pager [phone] Acute Rehab   Sheral ApleyHannah E Kamyiah Colantonio 11/29/2017, 11:25 AM

## 2017-11-29 NOTE — Interval H&P Note (Signed)
History and Physical Interval Note:  11/29/2017 1:45 PM  Stacey Martinez  has presented today for surgery, with the diagnosis of BACTEREMIA  The various methods of treatment have been discussed with the patient and family. After consideration of risks, benefits and other options for treatment, the patient has consented to  Procedure(s): TRANSESOPHAGEAL ECHOCARDIOGRAM (TEE) (N/A) as a surgical intervention .  The patient's history has been reviewed, patient examined, no change in status, stable for surgery.  I have reviewed the patient's chart and labs.  Questions were answered to the patient's satisfaction.     Dietrich PatesPaula Dickie Cloe

## 2017-11-30 LAB — CBC
HCT: 32.8 % — ABNORMAL LOW (ref 36.0–46.0)
Hemoglobin: 10.3 g/dL — ABNORMAL LOW (ref 12.0–15.0)
MCH: 28.8 pg (ref 26.0–34.0)
MCHC: 31.4 g/dL (ref 30.0–36.0)
MCV: 91.6 fL (ref 78.0–100.0)
Platelets: 529 10*3/uL — ABNORMAL HIGH (ref 150–400)
RBC: 3.58 MIL/uL — ABNORMAL LOW (ref 3.87–5.11)
RDW: 14.7 % (ref 11.5–15.5)
WBC: 7.4 10*3/uL (ref 4.0–10.5)

## 2017-11-30 MED ORDER — FLUCONAZOLE 150 MG PO TABS
150.0000 mg | ORAL_TABLET | Freq: Once | ORAL | Status: AC
  Administered 2017-11-30: 150 mg via ORAL
  Filled 2017-11-30: qty 1

## 2017-11-30 NOTE — Progress Notes (Signed)
Pt notified RN and CM that she had lost a "diamond ring in pre-op". Pt asked to describe ring to RN. RN checked with secretary on floor and CN as to location of lost and found and for phone number for security. Ring found, labeled with pt sticker, in plastic bag in top drawer of secretary desk.  RN returned ring to pt, in bag with [place] NT at bedside as witness. Pt verbalizes ring was hers, label on bag was hers and she was satisfied this was her property that had been returned to her. Pt expressed her thanks to staff.

## 2017-11-30 NOTE — Progress Notes (Signed)
PROGRESS NOTE    Stacey Martinez  ZOX:[id number] DOB: Nov 21, 1980 DOA: 11/18/2017 PCP: Arlie Solomons, MD   Brief Narrative:  Brief Narrative   37 y.o.femalewith history of IVDA (mostly cocaine, intermittently heroin)-presented with fever, worsening right foot/ankle pain, and erythema/swelling in a area in the lower part of her right neck. She was thought to have soft tissue infection/cellulitis-and started on vancomycin,however in spite of being on antimicrobial therapy for 48 hours,patient continuedto have persistent fever, and worsening swelling of the right foot highly concerning for underlying septic arthritis andabscess in the lower part of right neck area.  Patient was transferred to [facility] I&D, seen by Dr Lajoyce Corners and it was done on 4/12 along with I and D of Neck Abscess by Dr. Derrell Lolling.   Burna Mortimer AM she was seen in bed resting; Nurse paged this Afternoon stating that when he was doing the dressing change, neck wound was profusely bleeding and stopped eventually after pressure was held for at least 5-10 minutes. General Surgery evaluated and felt no intervention was necessary.  Patient is awaiting repeat I&D and wound closure of the right ankle to be done Wednesday 4/17.  This a.m. patient was complaining of intractable pain so IV ketorolac was added to her pain regimen.  Assessment & Plan:   Principal Problem:   Cellulitis Active Problems:   Leukocytosis   IVDU (intravenous drug user)   Anxiety and depression   Cellulitis of right ankle   Abscess of tendon sheath, right ankle and foot   Abscess of right foot  Right foot cellulitis and abscess due to S. pyogenes: Concern for septic arthritis in setting of IVDU. s/p I&D 4/12 and repeat I&D, partial closure 4/17 by Dr. Lajoyce Corners. Blood culture negative and echo without definite vegetation.  - Continue ampicillin per ID, switch to keflex 500mg  po BID x2 weeks at discharge. Leukocytosis resolved 4/20.  - Cardiology consulted for  TEE, negative 4/19. - MUST NOT DC WITH MIDLINE IN PLACE - Continue multimodal pain control as ordered. No indication for IV narcotics.  - Continue bowel regimen and antiemetics prn. Having mild constipation.   Supraclavicular fossa abscess: measuring 3x5 cm.Intramuscular extension of abscess into the right sternocleidomastoid muscle with several loculations. s/p I&D 4/12 with culture growing group A strep.  - General surgery recommended dressing change W>D BID, will follow up in clinic in 2 weeks. Will need HH-RN for daily dressing changes.   IVDU, polysubstance abuse, tobacco use: UDS + amphetamines, benzodiazepines,opiates,cocaine, THC. Hepatitis Panel Negative, HIV, and RPR Negative  - Discussed the need to avoid narcotics at discharge with the patient. Will use multimodal Tx's including tylenol, NSAID, and tramadol until she follows up with Dr. Lajoyce Corners.  - Pt not amenable to CSW consult/resource provision. Will stop using drugs "with will power."  - Nicotine patch ordered.  - PULL MIDLINE PRIOR TO DISCHARGE  Anxiety and depression: Dual diagnosis.  - Continue fluoxetine, quetiapine, trazodone and prn xanax only BID to avoid over sedation.   Thrombocytosis: Reactive.  - Monitor.    Hyperglycemia, prediabetes: HbA1c 5.7%.  - Change to carb-modified diet if hyperglycemia returns.   Normocytic anemia: Stable.   DVT prophylaxis: Enoxaparin Code Status: FULL CODE Family Communication: No family present at bedside Disposition Plan: Home likely 4/22 after 2 weeks IV abx to be followed by 2 weeks of keflex per ID. Home health being arranged including PT, OT, RN for wound packing, DME including rolling walker, wheelchair.   Consultants:   Orthopedic Surgery  General  Surgery   ID  Procedures:  - Right Abscess Neck I&D. Dr. Derrell Lollingamirez - Right Ankle Debridement and Wound Vac Placement. Dr. Lajoyce Cornersuda - Repeat debridement of right foot with sharp excision skin and soft tissue muscle and  fascia / local tissue rearrangement for partial wound closure 5x5cm / application of split-thickness allograft, wound size 15x7cm. Dr. Lajoyce Cornersuda 11/27/2017 - TEE  Patient sedated by anesthesia with Propofol IV  TEE probe advanced to mid esophagus without difficulty  Findings:  MV normal  Trace MR TV normal  Triv TR AV normal   PV normal  LA, LAA without masses No PFO by color doppler or with injection of agitated saline   LVEF and RVEF normal  Thoracic aorta normal  Antimicrobials:  Vancomycin 4/8-4/14 Zosyn 4/10-4/14 Ancef 4/12 and 4/17 Ampicillin 4/14 >>   Subjective: Pain controlled. Getting around better with PT. No fever, chills, dyspnea, chest pain. Right neck ROM improving.   Objective: Vitals:   11/29/17 1508 11/29/17 2100 11/30/17 0621 11/30/17 1433  BP: 106/62 105/72 107/61 116/67  Pulse: 77 93 69 80  Resp:  15 14 15   Temp: 97.9 F (36.6 C) 98.2 F (36.8 C) 98.4 F (36.9 C) 98.1 F (36.7 C)  TempSrc: Oral Oral Oral Oral  SpO2: 100% 98% 98% 99%  Weight:      Height:        Intake/Output Summary (Last 24 hours) at 11/30/2017 1455 Last data filed at 11/29/2017 2100 Gross per 24 hour  Intake 240 ml  Output -  Net 240 ml   Filed Weights   11/22/17 0946 11/27/17 0853 11/29/17 1321  Weight: 68.9 kg (152 lb) 68.9 kg (152 lb) 68.9 kg (152 lb)   Examination: Constitutional: Calm 36yo F in no distress Eyes: Sclera anicteric. PERRL. Lids and conjunctive are normal. ENMT: MMM, no oral ulcers, poor dentition Neck: Supple with no JVD. Right supraclavicular fossa with dressing and packed without surrounding erythema. c/d/i Respiratory: Nonlabored and clear bilaterally.   Cardiovascular: Regular rate and rhythm. No m/r/g. No lower extremity edema appreciated Abdomen: Soft, nontender, nondistended.  Bowel sounds present in all 4 quadrants. Musculoskeletal: Has a right arm midline IV without tenderness, erythema, fluctuance.  Right ankle with lateral wound vac  applied over skin graft with staples in place inferiorly. Some erythema which is improving. Wound vac in place. Toes with active ROM, no numbness, 2+ DP pulses bilaterally Skin: Warm and dry, has multiple track marks and scabs on upper extremities without fluctuance.  Neurologic: Cranial nerves II through XII grossly intact.  No appreciable focal deficits. Psychiatric: Normal mood and anxious affect.  Awake and alert.  Intact judgment and insight  Data Reviewed: I have personally reviewed following labs and imaging studies  CBC: Recent Labs  Lab 11/24/17 0437 11/24/17 1923 11/25/17 0657 11/26/17 0527 11/27/17 0524 11/30/17 0707  WBC 12.1* 12.9* 11.2* 12.4* 11.4* 7.4  NEUTROABS 8.6* 9.6* 7.7 9.0* 8.1*  --   HGB 11.4* 11.4* 12.5 11.1* 10.7* 10.3*  HCT 36.0 35.4* 39.3 35.7* 34.5* 32.8*  MCV 90.5 89.4 92.0 91.1 91.3 91.6  PLT 325 303 518* 519* 517* 529*   Basic Metabolic Panel: Recent Labs  Lab 11/24/17 0437 11/25/17 0657 11/26/17 0527 11/27/17 0524  NA 139 140 139 139  K 3.8 4.2 4.0 4.1  CL 104 105 105 105  CO2 25 28 26 27   GLUCOSE 104* 101* 98 94  BUN 12 10 10 14   CREATININE 0.71 0.73 0.74 0.93  CALCIUM 7.9* 8.2* 8.2* 8.3*  MG 2.1 2.2 2.2 2.1  PHOS 4.3 4.3 4.4 5.5*   GFR: Estimated Creatinine Clearance: 78.2 mL/min (by C-G formula based on SCr of 0.93 mg/dL). Liver Function Tests: Recent Labs  Lab 11/24/17 0437 11/25/17 0657 11/26/17 0527 11/27/17 0524  AST 30 26 20 20   ALT 32 33 28 23  ALKPHOS 74 79 74 74  BILITOT 0.3 0.2* 0.3 0.4  PROT 5.9* 6.3* 5.9* 5.7*  ALBUMIN 1.9* 2.1* 2.0* 2.1*   Recent Results (from the past 240 hour(s))  Surgical pcr screen     Status: Abnormal   Collection Time: 11/20/17 11:20 PM  Result Value Ref Range Status   MRSA, PCR NEGATIVE NEGATIVE Final   Staphylococcus aureus POSITIVE (A) NEGATIVE Final    Comment: (NOTE) The Xpert SA Assay (FDA approved for NASAL specimens in patients 28 years of age and older), is one component of  a comprehensive surveillance program. It is not intended to diagnose infection nor to guide or monitor treatment. Performed at [facility], [address], [place], [place] [address]   Aerobic/Anaerobic Culture (surgical/deep wound)     Status: None   Collection Time: 11/22/17 12:13 PM  Result Value Ref Range Status   Specimen Description FOOT RIGHT ABSCESS  Final   Special Requests NONE  Final   Gram Stain   Final    ABUNDANT WBC PRESENT, PREDOMINANTLY PMN MODERATE GRAM POSITIVE COCCI IN PAIRS IN CHAINS    Culture   Final    FEW GROUP A STREP (S.PYOGENES) ISOLATED NO ANAEROBES ISOLATED Performed at [facility], [address], [place], [place] [address]    Report Status 11/27/2017 FINAL  Final  Aerobic/Anaerobic Culture (surgical/deep wound)     Status: None   Collection Time: 11/22/17 12:14 PM  Result Value Ref Range Status   Specimen Description FOOT RIGHT NECK  Final   Special Requests NONE  Final   Gram Stain   Final    ABUNDANT WBC PRESENT, PREDOMINANTLY PMN FEW GRAM POSITIVE COCCI IN PAIRS IN CHAINS    Culture   Final    RARE GROUP A STREP (S.PYOGENES) ISOLATED NO ANAEROBES ISOLATED Performed at [facility], [address] [place], [place] [address]    Report Status 11/27/2017 FINAL  Final   Radiology Studies: No results found.  Scheduled Meds: . docusate sodium  100 mg Oral BID  . enoxaparin (LOVENOX) injection  40 mg Subcutaneous Q24H  . feeding supplement (ENSURE ENLIVE)  237 mL Oral BID BM  . FLUoxetine  20 mg Oral Daily  . gabapentin  600 mg Oral QID  . mouth rinse  15 mL Mouth Rinse BID  . multivitamin with minerals  1 tablet Oral Daily  . mupirocin ointment   Topical Daily  . nicotine  21 mg Transdermal Daily  . polyethylene glycol  17 g Oral Daily  . QUEtiapine  25-50 mg Oral QHS  . senna  1 tablet Oral BID  . traZODone  150 mg Oral QHS   Continuous Infusions: . sodium chloride 75 mL/hr at 11/22/17 1400  . sodium chloride  10 mL/hr at 11/22/17 1714  . sodium chloride 10 mL/hr at 11/27/17 1153  . ampicillin (OMNIPEN) IV Stopped (11/30/17 1441)  . lactated ringers 10 mL/hr at 11/27/17 0900  . methocarbamol (ROBAXIN)  IV      LOS: 12 days   Hazeline Junker, MD [facility] Pager [phone]  If 7PM-7AM, please contact night-coverage [email or web address] Password [facility] 11/30/2017, 2:55 PM

## 2017-11-30 NOTE — Care Management Note (Addendum)
Case Management Note  Patient Details  Name: Stacey BelfastStacy Martinez MRN: [id number] Date of Birth: 11/05/1980  Subjective/Objective:  37 yr old female admitted with cellulitis, multiple abscess. S/p I & D x2 and right foot I & D with skin grafting.                  Action/Plan:  Patient will need HHRN for Dressing changes to neck wound. Patient has medicaid, referral was called to Shaune LeeksJermaine Jenkins, [facility] Can not provide HHRN in patient's area. Case manager reached out to Rosebud PolesAdacia Schamberger, [facility], they will accept patient.  DME has been ordered. Patient says her mom and family will asist her at discharge.      Expected Discharge Date:   11/30/17               Expected Discharge Plan:  Home w Home Health Services  In-House Referral:  NA  Discharge planning Services  CM Consult  Post Acute Care Choice:  Durable Medical Equipment, Home Health Choice offered to:  NA  DME Arranged:  Walker rolling, Wheelchair manual DME Agency:  [facility]  HH Arranged[facility]  [facility], PT, [facility]  Status of Service:  In process, will continue to follow  If discussed at Long Length of Stay Meetings, dates discussed:    Additional Comments:  Durenda GuthrieBrady, Vernica Wachtel Naomi, RN 11/30/2017, 11:24 AM

## 2017-11-30 NOTE — Plan of Care (Signed)
Pt is able to verbalize her plan of care. [facility]. Pt states she feels improved.

## 2017-12-01 ENCOUNTER — Encounter (HOSPITAL_COMMUNITY): Payer: Self-pay | Admitting: Internal Medicine

## 2017-12-01 NOTE — Progress Notes (Signed)
PROGRESS NOTE    Stacey Martinez  ZOX:[id number] DOB: [birth year] DOA: 11/18/2017 PCP: Arlie Solomons, MD   Brief Narrative:  Brief Narrative   37 y.o.femalewith history of IVDA (mostly cocaine, intermittently heroin)-presented with fever, worsening right foot/ankle pain, and erythema/swelling in a area in the lower part of her right neck. She was thought to have soft tissue infection/cellulitis-and started on vancomycin,however in spite of being on antimicrobial therapy for 48 hours,patient continuedto have persistent fever, and worsening swelling of the right foot highly concerning for underlying septic arthritis andabscess in the lower part of right neck area.  Patient was transferred to [facility] I&D, seen by Dr Lajoyce Corners and it was done on 4/12 along with I and D of Neck Abscess by Dr. Derrell Lolling.   Burna Mortimer AM she was seen in bed resting; Nurse paged this Afternoon stating that when he was doing the dressing change, neck wound was profusely bleeding and stopped eventually after pressure was held for at least 5-10 minutes. General Surgery evaluated and felt no intervention was necessary.  Patient is awaiting repeat I&D and wound closure of the right ankle to be done Wednesday 4/17.  This a.m. patient was complaining of intractable pain so IV ketorolac was added to her pain regimen.  Assessment & Plan:   Principal Problem:   Cellulitis Active Problems:   Leukocytosis   IVDU (intravenous drug user)   Anxiety and depression   Cellulitis of right ankle   Abscess of tendon sheath, right ankle and foot   Abscess of right foot  Right foot cellulitis and abscess due to S. pyogenes: Concern for septic arthritis in setting of IVDU. s/p I&D 4/12 and repeat I&D, partial closure 4/17 by Dr. Lajoyce Corners. Blood culture negative and echo without definite vegetation.  - Continue ampicillin per ID through 4/22, switch to keflex 500mg  po BID x2 weeks at discharge. Leukocytosis resolved 4/20.  - Cardiology  consulted for TEE, negative 4/19. - MUST NOT DC WITH MIDLINE IN PLACE - Continue multimodal pain control as ordered. No indication for IV narcotics, discussed again with patient today.  - Continue bowel regimen and antiemetics prn. Having mild constipation, improved.  - Will DC with wound vac, follow up with Dr. Lajoyce Corners.   Supraclavicular fossa abscess: measuring 3x5 cm.Intramuscular extension of abscess into the right sternocleidomastoid muscle with several loculations. s/p I&D 4/12 with culture growing group A strep.  - General surgery recommended dressing change W>D BID, will follow up in clinic in 2 weeks. Will need HH-RN for daily dressing changes. This has been arranged.   IVDU, polysubstance abuse, tobacco use: UDS + amphetamines, benzodiazepines,opiates,cocaine, THC. Hepatitis Panel Negative, HIV, and RPR Negative  - Discussed the need to avoid narcotics at discharge with the patient. Will use multimodal Tx's including tylenol, NSAID, and tramadol until she follows up with Dr. Lajoyce Corners.  - Pt not amenable to CSW consult/resource provision. Will stop using drugs "with will power."  - Nicotine patch ordered.  - PULL MIDLINE PRIOR TO DISCHARGE  Anxiety and depression: Dual diagnosis.  - Continue fluoxetine, quetiapine, trazodone and prn xanax only BID to avoid over sedation.   Thrombocytosis: Reactive.  - Monitor.    Hyperglycemia, prediabetes: HbA1c 5.7%.  - Change to carb-modified diet if hyperglycemia returns.   Normocytic anemia: Stable.   DVT prophylaxis: Enoxaparin Code Status: FULL CODE Family Communication: No family present at bedside Disposition Plan: Home 4/22 after 2 weeks IV abx to be followed by 2 weeks of keflex per ID. Home health  being arranged including PT, OT, RN for wound packing, DME including rolling walker, wheelchair.   Consultants:   Orthopedic Surgery  General Surgery   ID  Procedures:  - Right Abscess Neck I&D. Dr. Derrell Lollingamirez - Right Ankle  Debridement and Wound Vac Placement. Dr. Lajoyce Cornersuda - Repeat debridement of right foot with sharp excision skin and soft tissue muscle and fascia / local tissue rearrangement for partial wound closure 5x5cm / application of split-thickness allograft, wound size 15x7cm. Dr. Lajoyce Cornersuda 11/27/2017 - TEE  Patient sedated by anesthesia with Propofol IV  TEE probe advanced to mid esophagus without difficulty  Findings:  MV normal  Trace MR TV normal  Triv TR AV normal   PV normal  LA, LAA without masses No PFO by color doppler or with injection of agitated saline   LVEF and RVEF normal  Thoracic aorta normal  Antimicrobials:  Vancomycin 4/8-4/14 Zosyn 4/10-4/14 Ancef 4/12 and 4/17 Ampicillin 4/14 >>   Subjective: Anxious about discharge tomorrow but has been moving around more, pain improving. No fevers.   Objective: Vitals:   11/30/17 1433 11/30/17 1940 12/01/17 0516 12/01/17 1445  BP: 116/67 111/62 (!) 105/53 115/61  Pulse: 80 86 76 85  Resp: 15 16 16 18   Temp: 98.1 F (36.7 C) 98.4 F (36.9 C) 98.2 F (36.8 C) 97.7 F (36.5 C)  TempSrc: Oral Oral Oral Oral  SpO2: 99% 98% 97% 98%  Weight:      Height:        Intake/Output Summary (Last 24 hours) at 12/01/2017 1529 Last data filed at 12/01/2017 1400 Gross per 24 hour  Intake 1020 ml  Output -  Net 1020 ml   Filed Weights   11/22/17 0946 11/27/17 0853 11/29/17 1321  Weight: 68.9 kg (152 lb) 68.9 kg (152 lb) 68.9 kg (152 lb)   Examination: Constitutional: Calm 36yo F in no distress ENMT: MMM, no oral ulcers, poor dentition Neck: Supple with no JVD. Right supraclavicular fossa with dressing and packed, dated 4/21, without surrounding erythema. c/d/i Respiratory: Nonlabored and clear bilaterally.   Cardiovascular: Regular rate and rhythm. No m/r/g. No lower extremity edema appreciated Abdomen: Soft, nontender, nondistended.  Bowel sounds present in all 4 quadrants. Musculoskeletal: Right ankle with lateral wound vac  applied over skin graft with staples in place inferiorly. Some erythema which is now stable. Toes appear normal. Wound vac in place with good seal, sanguinous discharge. Toes with active ROM, no numbness, 2+ DP pulses bilaterally Skin: Warm and dry, has multiple track marks and scabs on upper extremities without fluctuance. Has a right arm midline IV without tenderness, erythema, fluctuance. Neurologic: Cranial nerves II through XII grossly intact.  No appreciable focal deficits. Psychiatric: Normal mood and anxious affect.  Awake and alert.  Intact judgment and insight  Data Reviewed: I have personally reviewed following labs and imaging studies  CBC: Recent Labs  Lab 11/24/17 1923 11/25/17 0657 11/26/17 0527 11/27/17 0524 11/30/17 0707  WBC 12.9* 11.2* 12.4* 11.4* 7.4  NEUTROABS 9.6* 7.7 9.0* 8.1*  --   HGB 11.4* 12.5 11.1* 10.7* 10.3*  HCT 35.4* 39.3 35.7* 34.5* 32.8*  MCV 89.4 92.0 91.1 91.3 91.6  PLT 303 518* 519* 517* 529*   Basic Metabolic Panel: Recent Labs  Lab 11/25/17 0657 11/26/17 0527 11/27/17 0524  NA 140 139 139  K 4.2 4.0 4.1  CL 105 105 105  CO2 28 26 27   GLUCOSE 101* 98 94  BUN 10 10 14   CREATININE 0.73 0.74 0.93  CALCIUM  8.2* 8.2* 8.3*  MG 2.2 2.2 2.1  PHOS 4.3 4.4 5.5*   GFR: Estimated Creatinine Clearance: 78.2 mL/min (by C-G formula based on SCr of 0.93 mg/dL). Liver Function Tests: Recent Labs  Lab 11/25/17 0657 11/26/17 0527 11/27/17 0524  AST 26 20 20   ALT 33 28 23  ALKPHOS 79 74 74  BILITOT 0.2* 0.3 0.4  PROT 6.3* 5.9* 5.7*  ALBUMIN 2.1* 2.0* 2.1*   Recent Results (from the past 240 hour(s))  Aerobic/Anaerobic Culture (surgical/deep wound)     Status: None   Collection Time: 11/22/17 12:13 PM  Result Value Ref Range Status   Specimen Description FOOT RIGHT ABSCESS  Final   Special Requests NONE  Final   Gram Stain   Final    ABUNDANT WBC PRESENT, PREDOMINANTLY PMN MODERATE GRAM POSITIVE COCCI IN PAIRS IN CHAINS    Culture    Final    FEW GROUP A STREP (S.PYOGENES) ISOLATED NO ANAEROBES ISOLATED Performed at [facility], [address], [place], [place] [address]    Report Status 11/27/2017 FINAL  Final  Aerobic/Anaerobic Culture (surgical/deep wound)     Status: None   Collection Time: 11/22/17 12:14 PM  Result Value Ref Range Status   Specimen Description FOOT RIGHT NECK  Final   Special Requests NONE  Final   Gram Stain   Final    ABUNDANT WBC PRESENT, PREDOMINANTLY PMN FEW GRAM POSITIVE COCCI IN PAIRS IN CHAINS    Culture   Final    RARE GROUP A STREP (S.PYOGENES) ISOLATED NO ANAEROBES ISOLATED Performed at [facility], [address], [place], [place] [address]    Report Status 11/27/2017 FINAL  Final   Radiology Studies: No results found.  Scheduled Meds: . docusate sodium  100 mg Oral BID  . enoxaparin (LOVENOX) injection  40 mg Subcutaneous Q24H  . feeding supplement (ENSURE ENLIVE)  237 mL Oral BID BM  . FLUoxetine  20 mg Oral Daily  . gabapentin  600 mg Oral QID  . mouth rinse  15 mL Mouth Rinse BID  . multivitamin with minerals  1 tablet Oral Daily  . mupirocin ointment   Topical Daily  . nicotine  21 mg Transdermal Daily  . polyethylene glycol  17 g Oral Daily  . QUEtiapine  25-50 mg Oral QHS  . senna  1 tablet Oral BID  . traZODone  150 mg Oral QHS   Continuous Infusions: . ampicillin (OMNIPEN) IV Stopped (12/01/17 1319)  . lactated ringers 10 mL/hr at 11/27/17 0900  . methocarbamol (ROBAXIN)  IV      LOS: 13 days   Hazeline Junker, MD [facility] Pager [phone]  If 7PM-7AM, please contact night-coverage [email or web address] Password [facility] 12/01/2017, 3:29 PM

## 2017-12-02 MED ORDER — GABAPENTIN 600 MG PO TABS: 600.0000 mg | ORAL_TABLET | Freq: Four times a day (QID) | ORAL | 0 refills | Status: AC

## 2017-12-02 MED ORDER — TRAZODONE HCL 150 MG PO TABS: 150.0000 mg | ORAL_TABLET | Freq: Every day | ORAL | 0 refills | Status: DC

## 2017-12-02 MED ORDER — IBUPROFEN 600 MG PO TABS: 600.0000 mg | ORAL_TABLET | Freq: Four times a day (QID) | ORAL | 0 refills | Status: DC | PRN

## 2017-12-02 MED ORDER — QUETIAPINE FUMARATE 25 MG PO TABS: 25.0000 mg | ORAL_TABLET | Freq: Every day | ORAL | 0 refills | Status: AC

## 2017-12-02 MED ORDER — CEPHALEXIN 500 MG PO CAPS: 500.0000 mg | ORAL_CAPSULE | Freq: Two times a day (BID) | ORAL | 0 refills | Status: DC

## 2017-12-02 MED ORDER — QUETIAPINE FUMARATE 25 MG PO TABS: 25.0000 mg | ORAL_TABLET | Freq: Every day | ORAL | 0 refills | Status: DC

## 2017-12-02 MED ORDER — GABAPENTIN 600 MG PO TABS: 600.0000 mg | ORAL_TABLET | Freq: Four times a day (QID) | ORAL | 0 refills | Status: DC

## 2017-12-02 MED ORDER — FLUOXETINE HCL 20 MG PO CAPS: 20.0000 mg | ORAL_CAPSULE | Freq: Every day | ORAL | 0 refills | Status: DC

## 2017-12-02 MED ORDER — ACETAMINOPHEN 500 MG PO TABS: 1000.0000 mg | ORAL_TABLET | Freq: Four times a day (QID) | ORAL | 0 refills | Status: DC | PRN

## 2017-12-02 MED ORDER — TRAMADOL HCL 50 MG PO TABS: 50.0000 mg | ORAL_TABLET | Freq: Four times a day (QID) | ORAL | 0 refills | Status: AC | PRN

## 2017-12-02 NOTE — Discharge Summary (Signed)
Physician Discharge Summary  Stacey Martinez BJY:[id number] DOB: 20-Mar-1981 DOA: 11/18/2017  PCP: Arlie Solomons, MD  Admit date: 11/18/2017 Discharge date: 12/02/2017  Admitted From: Home Disposition: Home   Recommendations for Outpatient Follow-up:  1. Follow up with PCP in 1-2 weeks 2. Follow up with orthopedics, Dr. Lajoyce Corners in the next week for wound reeavluation, continuing wound vac until that time.  3. Follow up with general surgery as scheduled. Family instructed on procedure to pack wound prior to disachrge (HH-RN not able to service her geographic area).   Home Health: PT, OT Equipment/Devices: WC, RW, wound vac Discharge Condition: Stable CODE STATUS: Full Diet recommendation: Regular  Brief/Interim Summary: 37 y.o.femalewith history of IVDA presented with fever, worsening right foot/ankle pain, and erythema/swelling in an area in the lower part of her right neck. She was thought to have soft tissue infection/cellulitis and started on vancomycin,however in spite of being on antimicrobial therapy for 48 hours,patient continuedto have persistent fever, and worsening swelling of the right foot highly concerning for underlying septic arthritis andabscess in the lower part of right neck area.   Patient was transferred to [facility], seen by Dr. Lajoyce Corners and I&D was performed 4/12 along with I&D of supraclavicular fossa abscess by general surgery, Dr. Derrell Lolling. Wound cultures from both sites grew group A strep, so antibiotics narrowed to ampicillin and right foot I&D of necrotic tissue with split thickness skin graft, wound vac placement was performed 4/17 by Dr. Lajoyce Corners. Blood cultures and TEE have been negative. ID recommends 2 weeks of keflex 500mg  po BID at discharge.  Discharge Diagnoses:  Principal Problem:   Cellulitis Active Problems:   Leukocytosis   IVDU (intravenous drug user)   Anxiety and depression   Cellulitis of right ankle   Abscess of tendon sheath, right ankle and foot  Abscess of right foot  Right foot cellulitis and abscess due to S. pyogenes: Concern for septic arthritis in setting of IVDU. s/p I&D 4/12 and repeat I&D, partial closure 4/17 by Dr. Lajoyce Corners. Blood culture negative and echo without definite vegetation.  - Continued ampicillin per ID through 4/22, switch to keflex 500mg  po BID x2 weeks at discharge. Leukocytosis resolved 4/20.  - Though blood cultures negative, multifocal infections with same organism is concerning. Cardiology consulted for TEE, negative 4/19. - Pulled right midline IV and all other access prior to discharge.  - Continue multimodal pain control as ordered. No narcotics at discharge.  - Will DC with wound vac, follow up with Dr. Lajoyce Corners.   Supraclavicular fossa abscess: measuring 3x5 cm.Intramuscular extension of abscess into the right sternocleidomastoid muscle with several loculations. s/p I&D 4/12 with culture growing group A strep.  - General surgery recommended dressing change W>D BID while inpatient. Has follow up scheduled in clinic. Family teaching will be provided prior to discharge  IVDU, polysubstance abuse, tobacco use: UDS + amphetamines, benzodiazepines,opiates,cocaine, THC. Hepatitis Panel Negative, HIV, and RPR Negative  - Discussed the need to avoid narcotics at discharge with the patient. Will use multimodal Tx's including tylenol, NSAID, and tramadol until she follows up with Dr. Lajoyce Corners.  - Pt not amenable to CSW consult/resource provision. Will stop using drugs "with will power."  - Nicotine patch ordered.   Anxiety and depression: Dual diagnosis.  - Continue fluoxetine, quetiapine, trazodone, gabapentin at discharge.   Thrombocytosis: Reactive.  - Monitor.    Hyperglycemia, prediabetes: HbA1c 5.7%.  - Change to carb-modified diet if hyperglycemia returns.   Normocytic anemia: Stable.   Discharge Instructions Discharge  Instructions    Discharge instructions   Complete by:  As directed    As we  discussed, it is important to follow up with Dr. Audrie Lia office this week. Continue not bearing weight on the right leg and maintaining the wound vac until that time. Follow up with general surgery for the right neck area as well. You will need to change the dressing and packing daily. This will be taught to you and your family prior to discharge. It's very important avoid all illicit substances. Follow up with your PCP in the next 1 -2  weeks as well for full refills on your medications. 2-week supplies were sent to your pharmacy in addition to keflex 500mg  twice daily which you need to take for the next 2 weeks.  - If you have fever or worsening pain seek medical attention right away.   Increase activity slowly   Complete by:  As directed    Negative Pressure Wound Therapy - Incisional   Complete by:  As directed    Attached to the wound VAC dressing to the portable Praveena wound VAC pump that is in central supply in the main operating room.     Allergies as of 12/02/2017      Reactions   Phenytoin Sodium Extended Other (See Comments)   Seizure Fd&c Yellow #6-na Benzoate-phenytoin   Yellow Dye Other (See Comments)   Seizures Fd&c Yellow #6-na Benzoate-phenytoin   Doxycycline Rash   blisters Fixed drug eruption December 2018      Medication List    STOP taking these medications   ALPRAZolam 1 MG tablet Commonly known as:  XANAX     TAKE these medications   acetaminophen 500 MG tablet Commonly known as:  TYLENOL Take 2 tablets (1,000 mg total) by mouth every 6 (six) hours as needed (pain). What changed:    when to take this  reasons to take this   cephALEXin 500 MG capsule Commonly known as:  KEFLEX Take 1 capsule (500 mg total) by mouth 2 (two) times daily.   FLUoxetine 20 MG capsule Commonly known as:  PROZAC Take 1 capsule (20 mg total) by mouth daily. What changed:  medication strength   gabapentin 600 MG tablet Commonly known as:  NEURONTIN Take 1 tablet (600 mg  total) by mouth 4 (four) times daily.   ibuprofen 600 MG tablet Commonly known as:  ADVIL,MOTRIN Take 1 tablet (600 mg total) by mouth every 6 (six) hours as needed (pain). What changed:    medication strength  how much to take  when to take this  reasons to take this   methocarbamol 500 MG tablet Commonly known as:  ROBAXIN Take 1,500-2,000 mg by mouth 4 (four) times daily.   QUEtiapine 25 MG tablet Commonly known as:  SEROQUEL Take 1 tablet (25 mg total) by mouth at bedtime. What changed:  how much to take   traMADol 50 MG tablet Commonly known as:  ULTRAM Take 1 tablet (50 mg total) by mouth every 6 (six) hours as needed for up to 5 days.   traZODone 150 MG tablet Commonly known as:  DESYREL Take 1 tablet (150 mg total) by mouth at bedtime.            Durable Medical Equipment  (From admission, onward)        Start     Ordered   12/02/17 0948  For home use only DME lightweight manual wheelchair with seat cushion  (Wheelchairs)  Once  Comments:  Patient suffers from right foot and ankle cellulitis and abscess which impairs their ability to perform daily activities like bathing, dressing, feeding, grooming and toileting in the home.  A cane will not resolve  issue with performing activities of daily living. A wheelchair will allow patient to safely perform daily activities. Patient is not able to propel themselves in the home using a standard weight wheelchair due to general weakness. Patient can self propel in the lightweight wheelchair.  Accessories: elevating leg rests (ELRs), wheel locks, extensions and anti-tippers.   12/02/17 1610   12/02/17 0948  For home use only DME Walker rolling  [facility])  Once    Question:  Patient needs a walker to treat with the following condition  Answer:  Infection of bone of right ankle (HCC)   12/02/17 0952   11/30/17 1149  For home use only DME Walker rolling  Once    Question:  Patient needs a walker to treat with the  following condition  Answer:  Penetrating foot wound, right, initial encounter   11/30/17 1149   11/26/17 1249  For home use only DME lightweight manual wheelchair with seat cushion  Once    Comments:  Patient suffers from foot wound which impairs their ability to perform daily activities likeambulating in the home.  A cane will not resolve  issue with performing activities of daily living. A wheelchair will allow patient to safely perform daily activities. Patient is not able to propel themselves in the home using a standard weight wheelchair due to generalized weakness. Patient can self propel in the lightweight wheelchair.  Accessories: elevating leg rests (ELRs), wheel locks, extensions and anti-tippers.   11/26/17 1249     Follow-up Information    Nadara Mustard, MD Follow up in 1 week(s).   Specialty:  Orthopedic Surgery Why:  Call today to make a follow up appointment later THIS WEEK Contact information: [address] [place] [address] [phone]        [facility] Follow up on 12/10/2017.   Specialty:  General Surgery Why:  9:45am, please arrive by 9:15am for check in and paper work.  please bring insurance card and photo ID with you Contact information: [address] [place] [address] [phone]        [facility], [facility] Follow up.   Specialty:  Home Health Services Why:  A representative from [facility] will contact you to arrange start date and time for your Nursing and Home Health therapy. Contact information: [address] [place] [address] [phone]        Arlie Solomons, MD. Schedule an appointment as soon as possible for a visit in 1 week(s).   Specialty:  Family Medicine Contact information: [address] [place] [address] [phone]          Allergies  Allergen Reactions  . Phenytoin Sodium Extended Other (See Comments)    Seizure Fd&c Yellow #6-na  Benzoate-phenytoin  . Yellow Dye Other (See Comments)    Seizures Fd&c Yellow #6-na Benzoate-phenytoin  . Doxycycline Rash    blisters Fixed drug eruption December 2018    Consultations:  Orthopedics  ID  General surgery  Procedures/Studies: Dg Chest 2 View  Result Date: 11/18/2017 CLINICAL DATA:  Nausea and chills for 4 days. EXAM: CHEST - 2 VIEW COMPARISON:  None. FINDINGS: The heart size and mediastinal contours are within normal limits. Both lungs are clear.  The visualized skeletal structures are unremarkable. IMPRESSION: Negative.  No active cardiopulmonary disease. Electronically Signed   By: Myles RosenthalJohn  Stahl M.D.   On: 11/18/2017 19:27   Dg Ankle 2 Views Right  Result Date: 11/20/2017 CLINICAL DATA:  Ankle pain and swelling following injection EXAM: RIGHT ANKLE - 2 VIEW COMPARISON:  None. FINDINGS: Diffuse soft tissue swelling is noted about the ankle consistent with the given clinical history. No acute fracture or dislocation is noted. No bony erosive changes are seen. IMPRESSION: Diffuse soft tissue swelling without acute bony abnormality. Electronically Signed   By: Alcide CleverMark  Lukens M.D.   On: 11/20/2017 09:46   Ct Soft Tissue Neck W Contrast  Result Date: 11/20/2017 CLINICAL DATA:  History of drug use. Inflamed area at the base of the neck on the right. EXAM: CT NECK WITH CONTRAST TECHNIQUE: Multidetector CT imaging of the neck was performed using the standard protocol following the bolus administration of intravenous contrast. CONTRAST:  75mL OMNIPAQUE IOHEXOL 300 MG/ML  SOLN COMPARISON:  None. FINDINGS: Pharynx and larynx: No mucosal or submucosal lesion. Salivary glands: Parotid and submandibular glands are normal. Thyroid: Normal Lymph nodes: No enlarged or low-density nodes on either side of the neck. Vascular: Arterial and venous structures are patent. Limited intracranial: Normal Visualized orbits: Not included Mastoids and visualized paranasal sinuses: Clear Skeleton: Negative  Upper chest: Negative Other: There is an abscess in the right supraclavicular fossa region measuring 3 x 5 cm, with extension into the sternocleidomastoid muscle and a series of intramuscular abscesses. The highest abscess at the C3 level is small, measuring only 5 mm in diameter. Middle abscess at the C4-5 level shows a diameter of 1.3 cm. The lower abscess in communication with the supraclavicular abscess measures about 2 cm in diameter. IMPRESSION: Supraclavicular fossa abscess on the right measuring about 3 x 5 cm in diameter. Intramuscular extension of abscess into the right sternocleidomastoid muscle with several loculations. Electronically Signed   By: Paulina FusiMark  Shogry M.D.   On: 11/20/2017 13:13   Mr Ankle Right W Wo Contrast  Result Date: 11/20/2017 CLINICAL DATA:  IV drug user with fever and worsening foot/ankle pain, erythema and swelling. EXAM: MRI OF THE RIGHT ANKLE WITHOUT AND WITH CONTRAST TECHNIQUE: Multiplanar, multisequence MR imaging of the ankle was performed before and after the administration of intravenous contrast. CONTRAST:  15mL MULTIHANCE GADOBENATE DIMEGLUMINE 529 MG/ML IV SOLN COMPARISON:  None. FINDINGS: TENDONS Peroneal: Intact peroneus longus and peroneus brevis tendons. Posteromedial: Intact tibialis posterior, flexor hallucis longus and flexor digitorum longus tendons. Anterior: Intact tibialis anterior, extensor hallucis longus and extensor digitorum longus tendons. Achilles: Intact. Plantar Fascia: Intact. LIGAMENTS Lateral: Intact. Medial: Intact. CARTILAGE Ankle Joint: No joint effusion or chondral defect. Subtalar Joints/Sinus Tarsi: No joint effusion or chondral defect. Bones: No marrow signal abnormality. No fracture or dislocation. Other: Cellulitis with subcutaneous soft tissue edema of the included leg, ankle and midfoot. Crescentic subcutaneous fluid collections with subtle enhancement compatible with evolving soft tissue abscesses along the dorsal and lateral aspect of  the ankle and foot, the largest collection approximately 4.5 x 1.4 x 1.5 cm overlying the dorsal lateral aspect of the midfoot. IMPRESSION: IMPRESSION 1. Subcutaneous loculated enhancing soft tissue fluid collections along the dorsal and lateral aspect of the ankle and foot compatible with evolving soft tissue abscesses. Surrounding cellulitis of the ankle and foot without osteomyelitis. 2. Intact tendons and ligaments crossing the ankle joint. Electronically Signed   By: Tollie Ethavid  Kwon M.D.   On: 11/20/2017 19:56    -  Right Abscess Neck I&D. Dr. Derrell Lolling - Right Ankle Debridement and Wound Vac Placement. Dr. Lajoyce Corners - Repeat debridement of right foot with sharp excision skin and soft tissue muscle and fascia / local tissue rearrangement for partial wound closure 5x5cm / application of split-thickness allograft, wound size 15x7cm. Dr. Lajoyce Corners 11/27/2017 - TEE  Patient sedated by anesthesia with Propofol IV  TEE probe advanced to mid esophagus without difficulty  Findings:  MV normal Trace MR TV normal Triv TR AV normal  PV normal  LA, LAA without masses No PFO by color doppler or with injection of agitated saline   LVEF and RVEF normal  Thoracic aorta normal  Subjective: Pain is controlled. No fevers. Neck regaining further ROM.   Discharge Exam: Vitals:   12/01/17 2017 12/02/17 0513  BP: 134/78 127/78  Pulse: 84 71  Resp: 18   Temp: 98.2 F (36.8 C)   SpO2: 99% 97%   General: Drowsy 36yo F in no distress Cardiovascular: RRR, S1/S2 +, no rubs, no gallops Respiratory: CTA bilaterally, no wheezing, no rhonchi Abdominal: Soft, NT, ND, bowel sounds + Skin: Right supraclavicular fossa with dressing and packed, dated 4/21, without surrounding erythema. c/d/i Ext: Right ankle with lateral wound vac applied over skin graft with staples in place inferiorly. Some erythema which is now stable. Toes appear normal. Wound vac in place with good seal, sanguinous discharge. Toes with  active ROM, no numbness, 2+ DP pulses bilaterally  Labs: Basic Metabolic Panel: Recent Labs  Lab 11/26/17 0527 11/27/17 0524  NA 139 139  K 4.0 4.1  CL 105 105  CO2 26 27  GLUCOSE 98 94  BUN 10 14  CREATININE 0.74 0.93  CALCIUM 8.2* 8.3*  MG 2.2 2.1  PHOS 4.4 5.5*   Liver Function Tests: Recent Labs  Lab 11/26/17 0527 11/27/17 0524  AST 20 20  ALT 28 23  ALKPHOS 74 74  BILITOT 0.3 0.4  PROT 5.9* 5.7*  ALBUMIN 2.0* 2.1*   CBC: Recent Labs  Lab 11/26/17 0527 11/27/17 0524 11/30/17 0707  WBC 12.4* 11.4* 7.4  NEUTROABS 9.0* 8.1*  --   HGB 11.1* 10.7* 10.3*  HCT 35.7* 34.5* 32.8*  MCV 91.1 91.3 91.6  PLT 519* 517* 529*   Urinalysis    Component Value Date/Time   COLORURINE AMBER (A) 11/19/2017 1349   APPEARANCEUR HAZY (A) 11/19/2017 1349   LABSPEC 1.032 (H) 11/19/2017 1349   PHURINE 6.0 11/19/2017 1349   GLUCOSEU NEGATIVE 11/19/2017 1349   HGBUR NEGATIVE 11/19/2017 1349   BILIRUBINUR SMALL (A) 11/19/2017 1349   KETONESUR 5 (A) 11/19/2017 1349   PROTEINUR 100 (A) 11/19/2017 1349   NITRITE NEGATIVE 11/19/2017 1349   LEUKOCYTESUR NEGATIVE 11/19/2017 1349    Microbiology Recent Results (from the past 240 hour(s))  Aerobic/Anaerobic Culture (surgical/deep wound)     Status: None   Collection Time: 11/22/17 12:13 PM  Result Value Ref Range Status   Specimen Description FOOT RIGHT ABSCESS  Final   Special Requests NONE  Final   Gram Stain   Final    ABUNDANT WBC PRESENT, PREDOMINANTLY PMN MODERATE GRAM POSITIVE COCCI IN PAIRS IN CHAINS    Culture   Final    FEW GROUP A STREP (S.PYOGENES) ISOLATED NO ANAEROBES ISOLATED Performed at [facility], [address] [place], [place] [address]    Report Status 11/27/2017 FINAL  Final  Aerobic/Anaerobic Culture (surgical/deep wound)     Status: None   Collection Time: 11/22/17 12:14 PM  Result Value  Ref Range Status   Specimen Description FOOT RIGHT NECK  Final   Special Requests NONE  Final   Gram  Stain   Final    ABUNDANT WBC PRESENT, PREDOMINANTLY PMN FEW GRAM POSITIVE COCCI IN PAIRS IN CHAINS    Culture   Final    RARE GROUP A STREP (S.PYOGENES) ISOLATED NO ANAEROBES ISOLATED Performed at [facility], [address] [place], [place] [address]    Report Status 11/27/2017 FINAL  Final    Time coordinating discharge: Approximately 40 minutes  Hazeline Junker, MD  [facility] 12/02/2017, 9:52 AM Pager [phone]

## 2017-12-02 NOTE — Progress Notes (Signed)
RN gave patient discharge instructions, she states understanding RN also taught pt how to pack the wound and she stated understandning and walked me through the process pt given pain medication awaiting ride and then Midline will be removed prior to her leaving

## 2017-12-02 NOTE — Progress Notes (Addendum)
Physical Therapy Treatment Patient Details Name: Stacey BelfastStacy Amadon MRN: [id number] DOB: 11/24/1980 Today's Date: 12/02/2017    History of Present Illness 37 y.o. female with history of IVDA (mostly cocaine, intermittently heroin)-presented with fever, worsening right foot/ankle pain, and erythema/swelling in a area in the lower part of her right neck. She was thought to have soft tissue infection/cellulitis-and started on vancomycin, however in spite of being on antimicrobial therapy for 48 hours, patient continued to have persistent fever, and worsening swelling of the right foot highly concerning for underlying septic arthritis and abscess in the lower part of right neck area. now s/p I&D of neck and R ankle. Plan to go to OR wed 4/17 for another I&D and wound closure.     PT Comments    Pt received in bed ready to participate in PT.  She was PraxairMin Guard for safety during 2x standing pivot transfers bed<->bedside commode and gait .  Pts WC was present in room upon arrival.  Educated/demonstrated pt on proper technique with WC and how to operate it.  Pt demonstrated proper/safe technique with WC during session and demonstrated how to adjust it at end of session.  Discussed precautions for not pulling vac off foot upon arriving home.  Pt remains to benefit from skilled Home health PT due to needing Min Guard when OOB and to increase functional independence.      Follow Up Recommendations  Home health PT;Supervision for mobility/OOB     Equipment Recommendations  Rolling walker with 5" wheels;Wheelchair (measurements PT)    Recommendations for Other Services OT consult     Precautions / Restrictions Precautions Precautions: Fall Required Braces or Orthoses: Other Brace/Splint Other Brace/Splint: R CAM boot  Restrictions Weight Bearing Restrictions: Yes RLE Weight Bearing: Non weight bearing Other Position/Activity Restrictions: NWB   Mobility  Bed Mobility Overal bed mobility: Modified  Independent                Transfers Overall transfer level: Needs assistance Equipment used: Rolling walker (2 wheeled) Transfers: Sit to/from UGI CorporationStand;Stand Pivot Transfers Sit to Stand: Min guard         General transfer comment: min guard for safety.  Standing pivot transfer bed<->bedside commode and bed<->WC.    Ambulation/Gait Ambulation/Gait assistance: Min guard Ambulation Distance (Feet): 15 Feet Assistive device: Rolling walker (2 wheeled) Gait Pattern/deviations: Antalgic;Step-to pattern Gait velocity: decreased   General Gait Details: Min guard for safety, no LOB noted.     Stairs             Merchant navy officerWheelchair Mobility Wheelchair Mobility Wheelchair mobility: Yes Wheelchair propulsion: Both upper extremities Wheelchair parts: Supervision/cueing Distance: Tourist information centre manager250 Wheelchair Assistance Details (indicate cue type and reason): Basic education on how to operate WC and how to adjust/move parts on WC.  Pt demonstrated proper technique during and at end of session.    Modified Rankin (Stroke Patients Only)       Balance Overall balance assessment: Needs assistance Sitting-balance support: No upper extremity supported   Sitting balance - Comments: static sitting EOB    Standing balance support: Bilateral upper extremity supported;Single extremity supported   Standing balance comment: currently reliant on UE support                             Cognition Arousal/Alertness: Awake/alert Behavior During Therapy: WFL for tasks assessed/performed Overall Cognitive Status: Within Functional Limits for tasks assessed  General Comments: pt is easily distracted and requires some redirection, but feel this may be pt's baseline       Exercises      General Comments        Pertinent Vitals/Pain Pain Assessment: 0-10 Pain Score: 4  Pain Location: R foot Pain Descriptors / Indicators:  Discomfort;Grimacing Pain Intervention(s): Limited activity within patient's tolerance;Monitored during session;Repositioned    Home Living                      Prior Function            PT Goals (current goals can now be found in the care plan section) Acute Rehab PT Goals Patient Stated Goal: Play with her son PT Goal Formulation: With patient/family Time For Goal Achievement: 11/30/17 Potential to Achieve Goals: Good Progress towards PT goals: Progressing toward goals    Frequency    Min 3X/week      PT Plan Current plan remains appropriate    Co-evaluation              AM-PAC PT "6 Clicks" Daily Activity  Outcome Measure  Difficulty turning over in bed (including adjusting bedclothes, sheets and blankets)?: None Difficulty moving from lying on back to sitting on the side of the bed? : None Difficulty sitting down on and standing up from a chair with arms (e.g., wheelchair, bedside commode, etc,.)?: None Help needed moving to and from a bed to chair (including a wheelchair)?: A Little Help needed walking in hospital room?: A Little Help needed climbing 3-5 steps with a railing? : A Lot 6 Click Score: 20    End of Session Equipment Utilized During Treatment: Gait belt Activity Tolerance: Patient tolerated treatment well Patient left: in bed;with call bell/phone within reach Nurse Communication: Mobility status PT Visit Diagnosis: Pain;Unsteadiness on feet (R26.81);Other abnormalities of gait and mobility (R26.89) Pain - Right/Left: Right Pain - part of body: Ankle and joints of foot     Time: 1610-9604 PT Time Calculation (min) (ACUTE ONLY): 46 min  Charges:  $Gait Training: 8-22 mins $Therapeutic Activity: 8-22 mins $Wheel Chair Management: 8-22 mins                    G Codes:       Rosann Auerbach, SPTA [phone]    Rosann Auerbach 12/02/2017, 1:38 PM

## 2017-12-04 ENCOUNTER — Telehealth (INDEPENDENT_AMBULATORY_CARE_PROVIDER_SITE_OTHER): Payer: Self-pay

## 2017-12-04 NOTE — Telephone Encounter (Signed)
States pt's wound vac canister is full advised to d/c vac today ( its been 7 days post op) and apply dry dressing change daily. Pt has an appt with Dr. Lajoyce Cornersuda on Monday will cal with questions.

## 2017-12-06 ENCOUNTER — Telehealth (INDEPENDENT_AMBULATORY_CARE_PROVIDER_SITE_OTHER): Payer: Self-pay | Admitting: Orthopedic Surgery

## 2017-12-06 NOTE — Telephone Encounter (Signed)
Called and lm on vm to apologize I am not sure what orders she is requesting. Therapy? Nursing? If she would please call back and let me know what service it is that she is trying to obtain for the pt. She is/p a debridement of her right foot last week and just need to know what orders she is needing. I will be happy to take care of whatever it is that she needs.

## 2017-12-06 NOTE — Telephone Encounter (Signed)
[facility]  Stacey Martinez  [phone]   Verbal orders  2 times a week for 4 weeks    Pt would like to request inpatient rehab

## 2017-12-09 ENCOUNTER — Telehealth (INDEPENDENT_AMBULATORY_CARE_PROVIDER_SITE_OTHER): Payer: Self-pay | Admitting: Orthopedic Surgery

## 2017-12-09 ENCOUNTER — Encounter (INDEPENDENT_AMBULATORY_CARE_PROVIDER_SITE_OTHER): Payer: Self-pay | Admitting: Orthopedic Surgery

## 2017-12-09 ENCOUNTER — Ambulatory Visit (INDEPENDENT_AMBULATORY_CARE_PROVIDER_SITE_OTHER): Payer: Medicaid Other | Admitting: Orthopedic Surgery

## 2017-12-09 DIAGNOSIS — M65071 Abscess of tendon sheath, right ankle and foot: Secondary | ICD-10-CM

## 2017-12-09 MED ORDER — TRAMADOL HCL 50 MG PO TABS: 100.0000 mg | ORAL_TABLET | Freq: Four times a day (QID) | ORAL | 0 refills | Status: DC | PRN

## 2017-12-09 MED ORDER — SULFAMETHOXAZOLE-TRIMETHOPRIM 800-160 MG PO TABS: 1.0000 | ORAL_TABLET | Freq: Two times a day (BID) | ORAL | 0 refills | Status: DC

## 2017-12-09 NOTE — Telephone Encounter (Signed)
[facility]  [phone]    Verbal  2 times a week for 4 weeks   Weight bearing status pt is having trouble with not putting pressure on foot

## 2017-12-09 NOTE — Progress Notes (Signed)
Office Visit Note   Patient: Stacey Martinez           Date of Birth: June 20, 1981           MRN: [id number] Visit Date: 12/09/2017              Requested by: Arlie Solomons, MD [address] [place], [place] [address] PCP: Arlie Solomons, MD  Chief Complaint  Patient presents with  . Right Foot - Follow-up, Routine Post Op      HPI: Patient is a 37 year old woman who presents in follow-up for wound care and debridement surgery for ulceration dorsal lateral aspect of the right foot.  Patient is currently on Keflex she states she cannot take doxycycline.  She states she is not elevating her leg and has pain and swelling in the right foot.  Assessment & Plan: Visit Diagnoses:  1. Abscess of tendon sheath, right ankle and foot     Plan: Patient was given instructions to keep her foot elevated level with report at all times.  She is given 2 extra-large socks the sock will be worn around the clock correctly against the wound and change daily with cleansing with soap and water.  We will give her a prescription for Bactrim DS we will give her prescription for tramadol she will be nonweightbearing she is to work on range of motion of the ankle follow-up in 1 week.  I anticipate the patient will take at least a year for this to completely healed and she will be totally disabled for at least one year.  Follow-Up Instructions: Return in about 1 week (around 12/16/2017).   Ortho Exam  Patient is alert, oriented, no adenopathy, well-dressed, normal affect, normal respiratory effort. Examination patient has a good dorsalis pedis pulse with elevation redness completely resolved.  There is good granulation tissue beneath the skin graft.  There is no ascending cellulitis.  We will add Bactrim DS to expand her coverage.  There is no fluctuance no abscess no ascending cellulitis.  Imaging: No results found.   Labs: Lab Results  Component Value Date   HGBA1C 5.7 (H) 11/24/2017   ESRSEDRATE 82 (H) 11/19/2017   REPTSTATUS 11/27/2017 FINAL 11/22/2017   GRAMSTAIN  11/22/2017    ABUNDANT WBC PRESENT, PREDOMINANTLY PMN FEW GRAM POSITIVE COCCI IN PAIRS IN CHAINS    CULT  11/22/2017    RARE GROUP A STREP (S.PYOGENES) ISOLATED NO ANAEROBES ISOLATED Performed at [facility], [address], [place], [place] [address]     (HGBA1)@  There is no height or weight on file to calculate BMI.  Orders:  No orders of the defined types were placed in this encounter.  Meds ordered this encounter  Medications  . traMADol (ULTRAM) 50 MG tablet    Sig: Take 2 tablets (100 mg total) by mouth every 6 (six) hours as needed for moderate pain.    Dispense:  56 tablet    Refill:  0     Procedures: No procedures performed  Clinical Data: No additional findings.  ROS:  All other systems negative, except as noted in the HPI. Review of Systems  Objective: Vital Signs: There were no vitals taken for this visit.  Specialty Comments:  No specialty comments available.  PMFS History: Patient Active Problem List   Diagnosis Date Noted  . Abscess of right foot   . Abscess of tendon sheath, right ankle and foot   . Cellulitis of right ankle   .  Leukocytosis 11/19/2017  . IVDU (intravenous drug user) 11/19/2017  . Anxiety and depression 11/19/2017  . Cellulitis 11/18/2017   Past Medical History:  Diagnosis Date  . Hx of degenerative disc disease   . Substance abuse (HCC)     History reviewed. No pertinent family history.  Past Surgical History:  Procedure Laterality Date  . I&D EXTREMITY Right 11/22/2017   Procedure: RIGHT ANKLE DEBRIDEMENT AND PLACEMENT OF WOUND VAC  Drainege of Abcess left arm;  Surgeon: Nadara Mustard, MD;  Location: [facility] OR;  Service: Orthopedics;  Laterality: Right;  . I&D EXTREMITY Right 11/27/2017   Procedure: REPEAT DEBRIDEMENT OF THE RIGHT FOOT, WOUND CLOSURE;  Surgeon: Nadara Mustard, MD;  Location: [facility] OR;  Service:  Orthopedics;  Laterality: Right;  . INCISION AND DRAINAGE ABSCESS Right 11/22/2017   Procedure: INCISION AND DRAINAGE ABSCESS NECK ABCESS;  Surgeon: Nadara Mustard, MD;  Location: [facility] OR;  Service: Orthopedics;  Laterality: Right;  . IRRIGATION AND DEBRIDEMENT ABSCESS  11/22/2017   Procedure: IRRIGATION AND DEBRIDEMENT ABSCESS Neck;  Surgeon: Axel Filler, MD;  Location: [facility] OR;  Service: General;;  . TEE WITHOUT CARDIOVERSION N/A 11/29/2017   Procedure: TRANSESOPHAGEAL ECHOCARDIOGRAM (TEE);  Surgeon: Pricilla Riffle, MD;  Location: [facility] ENDOSCOPY;  Service: Cardiovascular;  Laterality: N/A;   Social History   Occupational History  . Not on file  Tobacco Use  . Smoking status: Current Every Day Smoker    Types: Cigarettes  . Smokeless tobacco: Current User  Substance and Sexual Activity  . Alcohol use: Not Currently  . Drug use: Yes  . Sexual activity: Not on file

## 2017-12-10 NOTE — Telephone Encounter (Signed)
Called and lm on vm to advise that per visit yesterday pt is to be non weight bearing on the right foot. She is to adhere to strict elevation and wearing a compression sock 24/7 except to wash and reapply. She may work on ROM of the ankle but no weight bearing. Gave verbal ok for orders as requested twice a week for 4 week.s to call with any questions.

## 2017-12-11 ENCOUNTER — Other Ambulatory Visit (INDEPENDENT_AMBULATORY_CARE_PROVIDER_SITE_OTHER): Payer: Self-pay

## 2017-12-11 ENCOUNTER — Telehealth (INDEPENDENT_AMBULATORY_CARE_PROVIDER_SITE_OTHER): Payer: Self-pay | Admitting: Orthopedic Surgery

## 2017-12-11 DIAGNOSIS — M65071 Abscess of tendon sheath, right ankle and foot: Secondary | ICD-10-CM

## 2017-12-11 NOTE — Telephone Encounter (Signed)
Melissa, home health nurse with [facility]  is wanting to see if there are any wound care orders for this patient and if you will give her a call back to advise # [phone]

## 2017-12-11 NOTE — Telephone Encounter (Signed)
Called [facility]  to advise of the medical grade compression sock 24/7 and strict elevation and non weight bearing for the right foot s/p I&D abscess. She also asked if we could make a referral to the [facility] in [place] for this pt as she states that it is difficult for her to get to the office and wants her care to be be transferred there. Advised that this is ok and that an order for this can be faxed and to call with any questions.

## 2017-12-12 ENCOUNTER — Telehealth (INDEPENDENT_AMBULATORY_CARE_PROVIDER_SITE_OTHER): Payer: Self-pay

## 2017-12-12 NOTE — Telephone Encounter (Signed)
Patient called stating that when she takes off her compression sock her leg is seeping and bleeding.  Stated that she put a gauze on her leg before putting her compression sock back on and when she took the sock off, it was seeping through the gauze.  Would like a call back.  Cb# is [phone].  Please advise.  Thank you.

## 2017-12-12 NOTE — Telephone Encounter (Signed)
I called pt and explained that the sock needs to maintain contact with the wound. It is impregnated with a nanosilver and copper and this is what will help stimulate healing.  she can appy 4x4 and ace bandage to the outside of the sock to absorb any additional drainage. That this will decrease with elevation and with the compression of the sock. The sock will debride  the wound of nonviable tissue when she removes it daily and this is expected. Pt voiced understanding and will call with any other questions. She does have an appt on Tuesday for follow up.

## 2017-12-13 NOTE — Telephone Encounter (Signed)
[facility]  Efraim Kaufmann  [phone]   Please call Efraim Kaufmann and also refax referral to [facility] they didn't receive it   #fax [phone]

## 2017-12-16 NOTE — Telephone Encounter (Signed)
Call to question referral. [facility] states that [facility] did not receive it.

## 2017-12-16 NOTE — Telephone Encounter (Signed)
Noted, refaxed order to [facility]. Pending appt

## 2017-12-17 ENCOUNTER — Encounter (INDEPENDENT_AMBULATORY_CARE_PROVIDER_SITE_OTHER): Payer: Self-pay | Admitting: Orthopedic Surgery

## 2017-12-17 ENCOUNTER — Telehealth (INDEPENDENT_AMBULATORY_CARE_PROVIDER_SITE_OTHER): Payer: Self-pay | Admitting: Orthopedic Surgery

## 2017-12-17 ENCOUNTER — Ambulatory Visit (INDEPENDENT_AMBULATORY_CARE_PROVIDER_SITE_OTHER): Payer: Medicaid Other | Admitting: Orthopedic Surgery

## 2017-12-17 DIAGNOSIS — L02611 Cutaneous abscess of right foot: Secondary | ICD-10-CM

## 2017-12-17 DIAGNOSIS — M65071 Abscess of tendon sheath, right ankle and foot: Secondary | ICD-10-CM

## 2017-12-17 MED ORDER — OXYCODONE-ACETAMINOPHEN 5-325 MG PO TABS: 1.0000 | ORAL_TABLET | Freq: Three times a day (TID) | ORAL | 0 refills | Status: DC | PRN

## 2017-12-17 NOTE — Progress Notes (Signed)
Office Visit Note   Patient: Stacey Martinez           Date of Birth: [birth year]           MRN: [id number] Visit Date: 12/17/2017              Requested by: Arlie Solomons, MD [address] [place], [place] [address] PCP: Arlie Solomons, MD  Chief Complaint  Patient presents with  . Left Foot - Edema, Follow-up, Wound Check      HPI: Patient is seen in follow-up for skin grafting for a large abscess ulceration dorsal lateral aspect right foot.  Patient states that she is still having significant pain despite taking 2 tramadol every 6 hours.  Assessment & Plan: Visit Diagnoses:  1. Abscess of tendon sheath, right ankle and foot   2. Abscess of right foot     Plan: We will have her continue with medical compression stocking the proper use of the sock and elevation was discussed.  Plan to follow-up closely in 1 week.  Follow-Up Instructions: Return in about 1 week (around 12/24/2017).   Ortho Exam  Patient is alert, oriented, no adenopathy, well-dressed, normal affect, normal respiratory effort. Examination patient has increased granulation tissue in the wound bed.  The fibrinous tissue is being debrided well by the sock.  There is no cellulitis there is swelling there is no signs of infection.  Imaging: No results found.   Labs: Lab Results  Component Value Date   HGBA1C 5.7 (H) 11/24/2017   ESRSEDRATE 82 (H) 11/19/2017   REPTSTATUS 11/27/2017 FINAL 11/22/2017   GRAMSTAIN  11/22/2017    ABUNDANT WBC PRESENT, PREDOMINANTLY PMN FEW GRAM POSITIVE COCCI IN PAIRS IN CHAINS    CULT  11/22/2017    RARE GROUP A STREP (S.PYOGENES) ISOLATED NO ANAEROBES ISOLATED Performed at [facility], [address], [place], [place] [address]     Lab Results  Component Value Date/Time   HGBA1C 5.7 (H) 11/24/2017 04:37 AM    There is no height or weight on file to calculate BMI.  Orders:  No orders of the defined types were placed in this encounter.  No orders  of the defined types were placed in this encounter.    Procedures: No procedures performed  Clinical Data: No additional findings.  ROS:  All other systems negative, except as noted in the HPI. Review of Systems  Objective: Vital Signs: There were no vitals taken for this visit.  Specialty Comments:  No specialty comments available.  PMFS History: Patient Active Problem List   Diagnosis Date Noted  . Abscess of right foot   . Abscess of tendon sheath, right ankle and foot   . Cellulitis of right ankle   . Leukocytosis 11/19/2017  . IVDU (intravenous drug user) 11/19/2017  . Anxiety and depression 11/19/2017  . Cellulitis 11/18/2017   Past Medical History:  Diagnosis Date  . Hx of degenerative disc disease   . Substance abuse (HCC)     History reviewed. No pertinent family history.  Past Surgical History:  Procedure Laterality Date  . I&D EXTREMITY Right 11/22/2017   Procedure: RIGHT ANKLE DEBRIDEMENT AND PLACEMENT OF WOUND VAC  Drainege of Abcess left arm;  Surgeon: Nadara Mustard, MD;  Location: [facility] OR;  Service: Orthopedics;  Laterality: Right;  . I&D EXTREMITY Right 11/27/2017   Procedure: REPEAT DEBRIDEMENT OF THE RIGHT FOOT, WOUND CLOSURE;  Surgeon: Nadara Mustard, MD;  Location: [facility] OR;  Service:  Orthopedics;  Laterality: Right;  . INCISION AND DRAINAGE ABSCESS Right 11/22/2017   Procedure: INCISION AND DRAINAGE ABSCESS NECK ABCESS;  Surgeon: Nadara Mustard, MD;  Location: [facility] OR;  Service: Orthopedics;  Laterality: Right;  . IRRIGATION AND DEBRIDEMENT ABSCESS  11/22/2017   Procedure: IRRIGATION AND DEBRIDEMENT ABSCESS Neck;  Surgeon: Axel Filler, MD;  Location: [facility] OR;  Service: General;;  . TEE WITHOUT CARDIOVERSION N/A 11/29/2017   Procedure: TRANSESOPHAGEAL ECHOCARDIOGRAM (TEE);  Surgeon: Pricilla Riffle, MD;  Location: [facility] ENDOSCOPY;  Service: Cardiovascular;  Laterality: N/A;   Social History   Occupational History  . Not on file  Tobacco Use  . Smoking  status: Current Every Day Smoker    Types: Cigarettes  . Smokeless tobacco: Current User  Substance and Sexual Activity  . Alcohol use: Not Currently  . Drug use: Yes  . Sexual activity: Not on file

## 2017-12-17 NOTE — Telephone Encounter (Signed)
Patient called stated needed a script for the compression socks -she purchased. They will take taxes off if you fax it  [facility] [phone]

## 2017-12-18 ENCOUNTER — Telehealth (INDEPENDENT_AMBULATORY_CARE_PROVIDER_SITE_OTHER): Payer: Self-pay | Admitting: Radiology

## 2017-12-18 ENCOUNTER — Telehealth (INDEPENDENT_AMBULATORY_CARE_PROVIDER_SITE_OTHER): Payer: Self-pay | Admitting: Orthopedic Surgery

## 2017-12-18 NOTE — Telephone Encounter (Signed)
Mimi from [facility] pharmacy called wanting to let Dr. Lajoyce Corners know that their is a medication reaction between the pecocet and another pain medication and wanted to see if he still would like for them to fill the RX. CB # [phone]

## 2017-12-18 NOTE — Telephone Encounter (Signed)
Confirmation #: [id number] Prior Approval #: [id number] Status: APPROVED IC [facility] and advised, s/w [place].

## 2017-12-18 NOTE — Telephone Encounter (Signed)
IC and advised Rx approved on [address].  IC pharmacy and advised.

## 2017-12-18 NOTE — Telephone Encounter (Signed)
Patient is also taking xanax  QD PRN only.  I have advised patient not to take the oxycodone with the xanax.  She understands.  Pharmacy was concerned about the drug interaction between the two meds. I added xanax to her chart meds.

## 2017-12-18 NOTE — Telephone Encounter (Signed)
IC patient and discussed, she wants a Rx faxed to [facility] instead as it is closer to her home.  Fax # [phone].  Faxed Rx.

## 2017-12-18 NOTE — Telephone Encounter (Signed)
Patient called advised the pharmacy need prior for the Rx (Percocet). Patient said the pharmacy faxed the request over today. Patient said she have not had any pain medicine and she is in a lot of pain. The number to contact patient is [phone]

## 2017-12-19 NOTE — Telephone Encounter (Signed)
error 

## 2017-12-20 ENCOUNTER — Telehealth (INDEPENDENT_AMBULATORY_CARE_PROVIDER_SITE_OTHER): Payer: Self-pay | Admitting: Orthopedic Surgery

## 2017-12-20 NOTE — Telephone Encounter (Signed)
Other message previously sent

## 2017-12-24 ENCOUNTER — Ambulatory Visit (INDEPENDENT_AMBULATORY_CARE_PROVIDER_SITE_OTHER): Payer: Medicaid Other | Admitting: Orthopedic Surgery

## 2017-12-24 ENCOUNTER — Encounter (INDEPENDENT_AMBULATORY_CARE_PROVIDER_SITE_OTHER): Payer: Self-pay | Admitting: Orthopedic Surgery

## 2017-12-24 VITALS — Temp 97.1°F

## 2017-12-24 DIAGNOSIS — M65071 Abscess of tendon sheath, right ankle and foot: Secondary | ICD-10-CM

## 2017-12-24 NOTE — Progress Notes (Signed)
Office Visit Note   Patient: Stacey Martinez           Date of Birth: 26-Oct-1980           MRN: [id number] Visit Date: 12/24/2017              Requested by: Arlie Solomons, MD [address] [place], [place] [address] PCP: Arlie Solomons, MD  Chief Complaint  Patient presents with  . Right Foot - Follow-up, Routine Post Op, Pain      HPI: Patient is a 37 year old woman who presents in follow-up status post debridement of abscess ulceration right foot with application of split-thickness skin graft.  Patient states he has been having increasing pain and swelling she states that she had too much pressure from the compression sock and had to remove it.  Patient states she has had nausea and diarrhea fever and chills she states she has had a temperature to 101 2 times over the past 4 days.  Patient is currently on antibiotics for a month.  Assessment & Plan: Visit Diagnoses:  1. Abscess of tendon sheath, right ankle and foot     Plan: Patient has been rolling down the sock.  We will place her in a Dynaflex wrap with Adaptic.  Discussed that we may have to change this twice a week but we will see if we can make it till Monday.  Discussed that she has any emergencies she needs to call us or go to [facility] emergency room.  She is to continue with her antibiotics continue with elevation.  Discussed the importance of coughing and deep breathing to keep her lungs clear.  I am concerned that she could get a pneumonia.  Patient is permanently disabled at this time we will need to fill out paperwork for her to state that she is unable to work.  This may change once the ulcer is healed but anticipate this disability will be for at least a year.  Discussed the importance of protein supplements with her severe protein caloric malnutrition.  Follow-Up Instructions: Return in about 1 week (around 12/31/2017).   Ortho Exam  Patient is alert, oriented, no adenopathy, well-dressed, normal affect,  normal respiratory effort. Examination patient's foot has 75% healthy granulation tissue with a small amount of fibrinous exudative tissue there is no cellulitis no odor no drainage no signs of infection.  Patient does have swelling in the foot ankle and leg.  There is a mark in the middle of her calf where she had rolled down the sock to the middle of her calf causing a constriction band.  Discussed the importance of wearing a sock all the way up.  Discussed the importance of trying to spread out the narcotic pain medicine and we will have to wait until next week to refill her prescription.  Imaging: No results found. No images are attached to the encounter.  Labs: Lab Results  Component Value Date   HGBA1C 5.7 (H) 11/24/2017   ESRSEDRATE 82 (H) 11/19/2017   REPTSTATUS 11/27/2017 FINAL 11/22/2017   GRAMSTAIN  11/22/2017    ABUNDANT WBC PRESENT, PREDOMINANTLY PMN FEW GRAM POSITIVE COCCI IN PAIRS IN CHAINS    CULT  11/22/2017    RARE GROUP A STREP (S.PYOGENES) ISOLATED NO ANAEROBES ISOLATED Performed at [facility], [address] [place], [place] [address]      Lab Results  Component Value Date   ALBUMIN 2.1 (L) 11/27/2017   ALBUMIN 2.0 (L) 11/26/2017  ALBUMIN 2.1 (L) 11/25/2017    There is no height or weight on file to calculate BMI.  Orders:  No orders of the defined types were placed in this encounter.  No orders of the defined types were placed in this encounter.    Procedures: No procedures performed  Clinical Data: No additional findings.  ROS:  All other systems negative, except as noted in the HPI. Review of Systems  Objective: Vital Signs: Temp (!) 97.1 F (36.2 C)   Specialty Comments:  No specialty comments available.  PMFS History: Patient Active Problem List   Diagnosis Date Noted  . Abscess of right foot   . Abscess of tendon sheath, right ankle and foot   . Cellulitis of right ankle   . Leukocytosis 11/19/2017  . IVDU  (intravenous drug user) 11/19/2017  . Anxiety and depression 11/19/2017  . Cellulitis 11/18/2017   Past Medical History:  Diagnosis Date  . Hx of degenerative disc disease   . Substance abuse (HCC)     History reviewed. No pertinent family history.  Past Surgical History:  Procedure Laterality Date  . I&D EXTREMITY Right 11/22/2017   Procedure: RIGHT ANKLE DEBRIDEMENT AND PLACEMENT OF WOUND VAC  Drainege of Abcess left arm;  Surgeon: Nadara Mustard, MD;  Location: [facility] OR;  Service: Orthopedics;  Laterality: Right;  . I&D EXTREMITY Right 11/27/2017   Procedure: REPEAT DEBRIDEMENT OF THE RIGHT FOOT, WOUND CLOSURE;  Surgeon: Nadara Mustard, MD;  Location: [facility] OR;  Service: Orthopedics;  Laterality: Right;  . INCISION AND DRAINAGE ABSCESS Right 11/22/2017   Procedure: INCISION AND DRAINAGE ABSCESS NECK ABCESS;  Surgeon: Nadara Mustard, MD;  Location: [facility] OR;  Service: Orthopedics;  Laterality: Right;  . IRRIGATION AND DEBRIDEMENT ABSCESS  11/22/2017   Procedure: IRRIGATION AND DEBRIDEMENT ABSCESS Neck;  Surgeon: Axel Filler, MD;  Location: [facility] OR;  Service: General;;  . TEE WITHOUT CARDIOVERSION N/A 11/29/2017   Procedure: TRANSESOPHAGEAL ECHOCARDIOGRAM (TEE);  Surgeon: Pricilla Riffle, MD;  Location: [facility] ENDOSCOPY;  Service: Cardiovascular;  Laterality: N/A;   Social History   Occupational History  . Not on file  Tobacco Use  . Smoking status: Current Every Day Smoker    Types: Cigarettes  . Smokeless tobacco: Current User  Substance and Sexual Activity  . Alcohol use: Not Currently  . Drug use: Yes  . Sexual activity: Not on file

## 2017-12-30 ENCOUNTER — Ambulatory Visit (INDEPENDENT_AMBULATORY_CARE_PROVIDER_SITE_OTHER): Payer: Medicaid Other | Admitting: Orthopedic Surgery

## 2017-12-30 ENCOUNTER — Encounter (INDEPENDENT_AMBULATORY_CARE_PROVIDER_SITE_OTHER): Payer: Self-pay | Admitting: Orthopedic Surgery

## 2017-12-30 VITALS — Ht 64.0 in | Wt 152.0 lb

## 2017-12-30 DIAGNOSIS — M65071 Abscess of tendon sheath, right ankle and foot: Secondary | ICD-10-CM

## 2017-12-30 MED ORDER — OXYCODONE-ACETAMINOPHEN 5-325 MG PO TABS: 1.0000 | ORAL_TABLET | Freq: Three times a day (TID) | ORAL | 0 refills | Status: DC | PRN

## 2017-12-30 NOTE — Progress Notes (Signed)
Office Visit Note   Patient: Stacey Martinez           Date of Birth: 22-Feb-1981           MRN: [id number] Visit Date: 12/30/2017              Requested by: Arlie Solomons, MD [address] [place], [place] [address] PCP: Arlie Solomons, MD  Chief Complaint  Patient presents with  . Right Foot - Routine Post Op    11/27/17 repeat I&D right foot abscess       HPI: Patient is a 37 year old woman status post limb salvage intervention for debridement of abscess right foot.  Patient has been instructed at the last visit to wear a medical compression stocking to decrease the swelling and drainage.  Patient has not been wearing the stockings she states she felt like it was rolling down at the top and she has been using a dry dressing.  Patient was instructed in washing  Assessment & Plan: Visit Diagnoses:  1. Abscess of tendon sheath, right ankle and foot     Plan: And using the compression sock as well as the importance of elevation.  Discussed that she will be disabled for at least 6 months.  We will complete her paperwork for this.  She is given a refill prescription for the Percocet for pain follow-up in 2 weeks.  Follow-Up Instructions: Return in about 2 weeks (around 01/13/2018).   Ortho Exam  Patient is alert, oriented, no adenopathy, well-dressed, normal affect, normal respiratory effort. Patient is currently on the Bactrim DS there is fibrinous exudate secondary to dependence and lack of compression patient elected not to use the compression sock as instructed.  There is approximately 50% granulation tissue 50% fibrinous exudative tissue with increased exudative tissue.  Imaging: No results found. No images are attached to the encounter.  Labs: Lab Results  Component Value Date   HGBA1C 5.7 (H) 11/24/2017   ESRSEDRATE 82 (H) 11/19/2017   REPTSTATUS 11/27/2017 FINAL 11/22/2017   GRAMSTAIN  11/22/2017    ABUNDANT WBC PRESENT, PREDOMINANTLY PMN FEW GRAM POSITIVE  COCCI IN PAIRS IN CHAINS    CULT  11/22/2017    RARE GROUP A STREP (S.PYOGENES) ISOLATED NO ANAEROBES ISOLATED Performed at [facility], [address] [place], [place] [address]      Lab Results  Component Value Date   ALBUMIN 2.1 (L) 11/27/2017   ALBUMIN 2.0 (L) 11/26/2017   ALBUMIN 2.1 (L) 11/25/2017    Body mass index is 26.09 kg/m.  Orders:  No orders of the defined types were placed in this encounter.  Meds ordered this encounter  Medications  . oxyCODONE-acetaminophen (PERCOCET/ROXICET) 5-325 MG tablet    Sig: Take 1 tablet by mouth every 8 (eight) hours as needed for severe pain.    Dispense:  20 tablet    Refill:  0     Procedures: No procedures performed  Clinical Data: No additional findings.  ROS:  All other systems negative, except as noted in the HPI. Review of Systems  Objective: Vital Signs: Ht  (1.626 m)   Wt 152 lb (68.9 kg)   BMI 26.09 kg/m   Specialty Comments:  No specialty comments available.  PMFS History: Patient Active Problem List   Diagnosis Date Noted  . Abscess of right foot   . Abscess of tendon sheath, right ankle and foot   . Cellulitis of right ankle   . Leukocytosis 11/19/2017  .  IVDU (intravenous drug user) 11/19/2017  . Anxiety and depression 11/19/2017  . Cellulitis 11/18/2017   Past Medical History:  Diagnosis Date  . Hx of degenerative disc disease   . Substance abuse (HCC)     History reviewed. No pertinent family history.  Past Surgical History:  Procedure Laterality Date  . I&D EXTREMITY Right 11/22/2017   Procedure: RIGHT ANKLE DEBRIDEMENT AND PLACEMENT OF WOUND VAC  Drainege of Abcess left arm;  Surgeon: Nadara Mustard, MD;  Location: [facility] OR;  Service: Orthopedics;  Laterality: Right;  . I&D EXTREMITY Right 11/27/2017   Procedure: REPEAT DEBRIDEMENT OF THE RIGHT FOOT, WOUND CLOSURE;  Surgeon: Nadara Mustard, MD;  Location[facility] OR;  Service: Orthopedics;  Laterality: Right;  . INCISION AND  DRAINAGE ABSCESS Right 11/22/2017   Procedure: INCISION AND DRAINAGE ABSCESS NECK ABCESS;  Surgeon: Nadara Mustard, MD;  Location: [facility] OR;  Service: Orthopedics;  Laterality: Right;  . IRRIGATION AND DEBRIDEMENT ABSCESS  11/22/2017   Procedure: IRRIGATION AND DEBRIDEMENT ABSCESS Neck;  Surgeon: Axel Filler, MD;  Location: [facility] OR;  Service: General;;  . TEE WITHOUT CARDIOVERSION N/A 11/29/2017   Procedure: TRANSESOPHAGEAL ECHOCARDIOGRAM (TEE);  Surgeon: Pricilla Riffle, MD;  Location: [facility] ENDOSCOPY;  Service: Cardiovascular;  Laterality: N/A;   Social History   Occupational History  . Not on file  Tobacco Use  . Smoking status: Current Every Day Smoker    Types: Cigarettes  . Smokeless tobacco: Current User  Substance and Sexual Activity  . Alcohol use: Not Currently  . Drug use: Yes  . Sexual activity: Not on file

## 2018-01-02 ENCOUNTER — Telehealth (INDEPENDENT_AMBULATORY_CARE_PROVIDER_SITE_OTHER): Payer: Self-pay | Admitting: Orthopedic Surgery

## 2018-01-02 ENCOUNTER — Emergency Department (HOSPITAL_COMMUNITY)
Admission: EM | Admit: 2018-01-02 | Discharge: 2018-01-02 | Disposition: A | Discharge status: Home / Self Care | Payer: Medicaid Other | Attending: Emergency Medicine | Admitting: Emergency Medicine

## 2018-01-02 ENCOUNTER — Telehealth (INDEPENDENT_AMBULATORY_CARE_PROVIDER_SITE_OTHER): Payer: Self-pay

## 2018-01-02 ENCOUNTER — Encounter (HOSPITAL_COMMUNITY): Payer: Self-pay | Admitting: Emergency Medicine

## 2018-01-02 ENCOUNTER — Other Ambulatory Visit: Payer: Self-pay

## 2018-01-02 ENCOUNTER — Emergency Department (HOSPITAL_COMMUNITY): Payer: Medicaid Other

## 2018-01-02 DIAGNOSIS — G8918 Other acute postprocedural pain: Secondary | ICD-10-CM | POA: Insufficient documentation

## 2018-01-02 DIAGNOSIS — R509 Fever, unspecified: Secondary | ICD-10-CM | POA: Insufficient documentation

## 2018-01-02 DIAGNOSIS — F17210 Nicotine dependence, cigarettes, uncomplicated: Secondary | ICD-10-CM | POA: Diagnosis not present

## 2018-01-02 LAB — CBC WITH DIFFERENTIAL/PLATELET
ABS IMMATURE GRANULOCYTES: 0 10*3/uL (ref 0.0–0.1)
BASOS PCT: 1 %
Basophils Absolute: 0 10*3/uL (ref 0.0–0.1)
EOS PCT: 5 %
Eosinophils Absolute: 0.3 10*3/uL (ref 0.0–0.7)
HCT: 38.8 % (ref 36.0–46.0)
HEMOGLOBIN: 12.3 g/dL (ref 12.0–15.0)
Immature Granulocytes: 0 %
LYMPHS PCT: 34 %
Lymphs Abs: 2.1 10*3/uL (ref 0.7–4.0)
MCH: 28.5 pg (ref 26.0–34.0)
MCHC: 31.7 g/dL (ref 30.0–36.0)
MCV: 90 fL (ref 78.0–100.0)
MONOS PCT: 5 %
Monocytes Absolute: 0.3 10*3/uL (ref 0.1–1.0)
Neutro Abs: 3.5 10*3/uL (ref 1.7–7.7)
Neutrophils Relative %: 55 %
Platelets: 218 10*3/uL (ref 150–400)
RBC: 4.31 MIL/uL (ref 3.87–5.11)
RDW: 14.4 % (ref 11.5–15.5)
WBC: 6.3 10*3/uL (ref 4.0–10.5)

## 2018-01-02 LAB — URINALYSIS, ROUTINE W REFLEX MICROSCOPIC
Bacteria, UA: NONE SEEN
Glucose, UA: NEGATIVE mg/dL
Ketones, ur: NEGATIVE mg/dL
Leukocytes, UA: NEGATIVE
Nitrite: NEGATIVE
PH: 5 (ref 5.0–8.0)
Protein, ur: NEGATIVE mg/dL
SPECIFIC GRAVITY, URINE: 1.027 (ref 1.005–1.030)

## 2018-01-02 LAB — COMPREHENSIVE METABOLIC PANEL
ALBUMIN: 4 g/dL (ref 3.5–5.0)
ALK PHOS: 65 U/L (ref 38–126)
ALT: 11 U/L — ABNORMAL LOW (ref 14–54)
ANION GAP: 7 (ref 5–15)
AST: 15 U/L (ref 15–41)
BUN: 15 mg/dL (ref 6–20)
CALCIUM: 8.9 mg/dL (ref 8.9–10.3)
CO2: 26 mmol/L (ref 22–32)
Chloride: 106 mmol/L (ref 101–111)
Creatinine, Ser: 0.82 mg/dL (ref 0.44–1.00)
GFR calc Af Amer: >60 mL/min (ref >60)
GFR calc non Af Amer: >60 mL/min (ref >60)
GLUCOSE: 104 mg/dL — AB (ref 65–99)
Potassium: 4.6 mmol/L (ref 3.5–5.1)
SODIUM: 139 mmol/L (ref 135–145)
Total Bilirubin: 0.6 mg/dL (ref 0.3–1.2)
Total Protein: 6.6 g/dL (ref 6.5–8.1)

## 2018-01-02 LAB — I-STAT BETA HCG BLOOD, ED (MC, WL, AP ONLY): I-stat hCG, quantitative: <5 m[IU]/mL (ref <5)

## 2018-01-02 LAB — I-STAT CG4 LACTIC ACID, ED: Lactic Acid, Venous: 0.95 mmol/L (ref 0.5–1.9)

## 2018-01-02 MED ORDER — SODIUM CHLORIDE 0.9 % IV SOLN
125.00 | INTRAVENOUS | Status: DC
Start: ? — End: 2018-01-02

## 2018-01-02 MED ORDER — MORPHINE SULFATE (PF) 4 MG/ML IV SOLN: 6.0000 mg | Freq: Once | INTRAVENOUS | Status: DC

## 2018-01-02 MED ORDER — OXYCODONE-ACETAMINOPHEN 5-325 MG PO TABS
1.0000 | ORAL_TABLET | Freq: Once | ORAL | Status: AC
  Administered 2018-01-02: 1 via ORAL
  Filled 2018-01-02: qty 1

## 2018-01-02 NOTE — ED Notes (Signed)
2 Sutures and 15 staples removed from pts foot. Foot wrapped in non-adherent pad per ortho PA

## 2018-01-02 NOTE — ED Notes (Addendum)
Patient transported to X-ray 

## 2018-01-02 NOTE — Telephone Encounter (Signed)
Autumn,  This patient called and requested her papers to be sent to her Caseworker. She spoke to [facility] stone this morning and was promised this would be sent out today.   She feels the papers have been here long enough.  Wants them sent today as promised in hopes the mistakes were corrected if not send what you have.  Fax# [phone] Amy Ala Dach

## 2018-01-02 NOTE — ED Triage Notes (Signed)
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 Patient states on 5/20 she started to notice an odor and purulent discharge from the wound.

## 2018-01-02 NOTE — Progress Notes (Addendum)
Patient ID: Stacey Martinez, female   DOB: February 05, 1981, 37 y.o.   MRN: [id number]   LOS: 0 days   Subjective: Asked to come see pt who came to ED with possible foot infection. She had just seen Dr. Lajoyce Corners on Monday where things looked good. She notes subjective fevers and malodorous and discolored discharge on her dressings. She went to [facility] ED with same c/o and w/u did not show anything suspicious. She left from ED AMA; provider noted she was pain seeking. She does note severe pain in foot and ankle that the Percocet does not help.   Objective: Vital signs in last 24 hours: Temp:  [98.4 F (36.9 C)] 98.4 F (36.9 C) (05/23 1240) Pulse Rate:  [81-89] 85 (05/23 1415) Resp:  [16-18] 18 (05/23 1345) BP: (102-122)/(66-87) 102/78 (05/23 1415) SpO2:  [99 %-100 %] 100 % (05/23 1415)     Laboratory  CBC Recent Labs    01/02/18 1242  WBC 6.3  HGB 12.3  HCT 38.8  PLT 218   BMET Recent Labs    01/02/18 1242  NA 139  K 4.6  CL 106  CO2 26  GLUCOSE 104*  BUN 15  CREATININE 0.82  CALCIUM 8.9     Physical Exam General appearance: alert and no distress  Right foot: Wound looks even better than Dr. Audrie Lia note reflects on Monday with ~90% granulation, no surrounding erythema, and no odor. 2+ DP pulse, no streaking erythema, staples/sutures still in place.   Assessment/Plan: Right foot wound -- After further discussion with patient her main issue seems to be the pain. I encouraged her to ask her PCP for referral to pain management. Will remove sutures/staples. In the meantime advised continued wound care and follow up as directed by Dr. Lajoyce Corners. Ok to discharge home.    Freeman Caldron, PA-C Orthopedic Surgery [phone] 01/02/2018

## 2018-01-02 NOTE — ED Notes (Signed)
Pt in xray

## 2018-01-02 NOTE — Telephone Encounter (Signed)
Patient called and left a VM stating that she had to call 911 yesterday due to her right foot.  Patient was seen at [facility] in [place].  Stated that her body is rejecting her antibiotics and was told that she needs IV Antibiotics and her right foot is infected and going up her right leg.  Right foot has yellow drainage, pus, and an odor.    Advised Dr. Lajoyce Corners of message above and he stated for patient to go to [facility] ER. Talked with patient and advised her of message per Dr. Lajoyce Corners.  Patient understands.

## 2018-01-02 NOTE — Telephone Encounter (Signed)
I called and sw pt and advised that the form had ben done and has been faxed. I advised for her to contact Amy Ala Dach to see if she has received it and if not I would be happy to send it again or to leave it at the front desk for pick up. I advised the pt that forms normally go through [employer] and that this is how all future forms would be handled. I also advised that she could of had the paperwork at the time of her appt this week but she wanted the time out of work to be extended and did not want all the questions answered in relation to her capability.

## 2018-01-02 NOTE — ED Provider Notes (Signed)
[facility] EMERGENCY DEPARTMENT Provider Note   CSN: [id number] Arrival date & time: 01/02/18  1225     History   Chief Complaint Chief Complaint  Patient presents with  . Wound Infection    HPI Stacey Martinez is a 37 y.o. female.  HPI  37 year old female with a recent abscess of the right foot and tendon sheath status post incision and drainage and debridement by Dr. Lajoyce Corners in April 2019 presents with concern for wound infection.  She has been having increased pain as well as bloody and yellow drainage for about 5 or 6 days.  She states she had a fever of 101 in the last 1-2 days.  She states she saw Dr. Lajoyce Corners on 5/20 and he was concerned about an infection.  She called the office today and was referred here.  She states she is insensate in her right foot but still has a lot of pain in her foot and ankle.  Past Medical History:  Diagnosis Date  . Hx of degenerative disc disease   . Substance abuse [facility])     Patient Active Problem List   Diagnosis Date Noted  . Abscess of right foot   . Abscess of tendon sheath, right ankle and foot   . Cellulitis of right ankle   . Leukocytosis 11/19/2017  . IVDU (intravenous drug user) 11/19/2017  . Anxiety and depression 11/19/2017  . Cellulitis 11/18/2017    Past Surgical History:  Procedure Laterality Date  . I&D EXTREMITY Right 11/22/2017   Procedure: RIGHT ANKLE DEBRIDEMENT AND PLACEMENT OF WOUND VAC  Drainege of Abcess left arm;  Surgeon: Nadara Mustard, MD;  Location: [facility] OR;  Service: Orthopedics;  Laterality: Right;  . I&D EXTREMITY Right 11/27/2017   Procedure: REPEAT DEBRIDEMENT OF THE RIGHT FOOT, WOUND CLOSURE;  Surgeon: Nadara Mustard, MD;  Location: [facility] OR;  Service: Orthopedics;  Laterality: Right;  . INCISION AND DRAINAGE ABSCESS Right 11/22/2017   Procedure: INCISION AND DRAINAGE ABSCESS NECK ABCESS;  Surgeon: Nadara Mustard, MD;  Location: [facility] OR;  Service: Orthopedics;  Laterality: Right;  . IRRIGATION AND  DEBRIDEMENT ABSCESS  11/22/2017   Procedure: IRRIGATION AND DEBRIDEMENT ABSCESS Neck;  Surgeon: Axel Filler, MD;  Location: [facility] OR;  Service: General;;  . TEE WITHOUT CARDIOVERSION N/A 11/29/2017   Procedure: TRANSESOPHAGEAL ECHOCARDIOGRAM (TEE);  Surgeon: Pricilla Riffle, MD;  Location: [facility] ENDOSCOPY;  Service: Cardiovascular;  Laterality: N/A;     OB History   None      Home Medications    Prior to Admission medications   Medication Sig Start Date End Date Taking? Authorizing Provider  acetaminophen (TYLENOL) 500 MG tablet Take 2 tablets (1,000 mg total) by mouth every 6 (six) hours as needed (pain). 12/02/17   Tyrone Nine, MD  ALPRAZolam Prudy Feeler) 1 MG tablet Take 1 mg by mouth daily as needed for anxiety.    [provider]  cephALEXin (KEFLEX) 500 MG capsule Take 1 capsule (500 mg total) by mouth 2 (two) times daily. 12/02/17   Tyrone Nine, MD  FLUoxetine (PROZAC) 20 MG capsule Take 1 capsule (20 mg total) by mouth daily. 12/02/17   Tyrone Nine, MD  gabapentin (NEURONTIN) 600 MG tablet Take 1 tablet (600 mg total) by mouth 4 (four) times daily. 12/02/17   Tyrone Nine, MD  ibuprofen (ADVIL,MOTRIN) 600 MG tablet Take 1 tablet (600 mg total) by mouth every 6 (six) hours as needed (pain). 12/02/17   Hazeline Junker  B, MD  methocarbamol (ROBAXIN) 500 MG tablet Take 1,500-2,000 mg by mouth 4 (four) times daily. 10/16/17   [provider]  oxyCODONE-acetaminophen (PERCOCET/ROXICET) 5-325 MG tablet Take 1 tablet by mouth every 8 (eight) hours as needed for severe pain. 12/30/17   Nadara Mustard, MD  QUEtiapine (SEROQUEL) 25 MG tablet Take 1 tablet (25 mg total) by mouth at bedtime. 12/02/17   Tyrone Nine, MD  sulfamethoxazole-trimethoprim (BACTRIM DS,SEPTRA DS) 800-160 MG tablet Take 1 tablet by mouth 2 (two) times daily. 12/09/17   Nadara Mustard, MD  traMADol (ULTRAM) 50 MG tablet Take 2 tablets (100 mg total) by mouth every 6 (six) hours as needed for moderate pain. 12/09/17    Nadara Mustard, MD  traMADol (ULTRAM) 50 MG tablet Take 2 tablets (100 mg total) by mouth every 6 (six) hours as needed for moderate pain. 12/09/17   Nadara Mustard, MD  traZODone (DESYREL) 150 MG tablet Take 1 tablet (150 mg total) by mouth at bedtime. 12/02/17   Tyrone Nine, MD    Family History No family history on file.  Social History Social History   Tobacco Use  . Smoking status: Current Every Day Smoker    Types: Cigarettes  . Smokeless tobacco: Current User  Substance Use Topics  . Alcohol use: Not Currently  . Drug use: Yes     Allergies   Phenytoin sodium extended; Yellow dye; and Doxycycline   Review of Systems Review of Systems  Constitutional: Positive for fever.  Musculoskeletal: Positive for arthralgias.  Skin: Positive for wound.  All other systems reviewed and are negative.    Physical Exam Updated Vital Signs BP 117/68 (BP Location: Right Arm)   Pulse 80   Temp 98.4 F (36.9 C)   Resp 18   SpO2 100%   Physical Exam  Constitutional: She is oriented to person, place, and time. She appears well-developed and well-nourished.  HENT:  Head: Normocephalic and atraumatic.  Nose: Nose normal.  Eyes: Right eye exhibits no discharge. Left eye exhibits no discharge.  Cardiovascular: Normal rate and regular rhythm.  Pulses:      Dorsalis pedis pulses are 2+ on the right side.  Pulmonary/Chest: Effort normal.  Musculoskeletal:       Right foot: There is no tenderness.       Feet:  Neurological: She is alert and oriented to person, place, and time.  Skin: Skin is warm and dry.  Nursing note and vitals reviewed.      ED Treatments / Results  Labs (all labs ordered are listed, but only abnormal results are displayed) Labs Reviewed  COMPREHENSIVE METABOLIC PANEL - Abnormal; Notable for the following components:      Result Value   Glucose, Bld 104 (*)    ALT 11 (*)    All other components within normal limits  URINALYSIS, ROUTINE W REFLEX  MICROSCOPIC - Abnormal; Notable for the following components:   Hgb urine dipstick MODERATE (*)    Bilirubin Urine SMALL (*)    All other components within normal limits  CBC WITH DIFFERENTIAL/PLATELET  I-STAT CG4 LACTIC ACID, ED  I-STAT BETA HCG BLOOD, ED ([facility], [facility], [facility] ONLY)    EKG None  Radiology Dg Ankle Complete Right  Result Date: 01/02/2018 CLINICAL DATA:  Infection on top of foot. EXAM: RIGHT ANKLE - COMPLETE 3+ VIEW COMPARISON:  Foot radiograph 523 [id number]. FINDINGS: There is no evidence of fracture, dislocation, or joint effusion. There is no evidence of arthropathy  or other focal bone abnormality. Soft tissues are swollen. IMPRESSION: No definite changes of osteomyelitis although marked soft tissue swelling is present. Continued surveillance may be warranted. Electronically Signed   By: Elsie Stain M.D.   On: 01/02/2018 14:32   Dg Foot Complete Right  Result Date: 01/02/2018 CLINICAL DATA:  Open wound draining yellow fluid and RIGHT foot for 2-3 days, swelling, post debridement skin grafting on 11/27/2017 EXAM: RIGHT FOOT COMPLETE - 3+ VIEW COMPARISON:  None FINDINGS: Osseous mineralization low normal. Dorsal and lateral soft tissue swelling with multiple skin clips laterally. Joint spaces preserved. No acute fracture tip of lateral malleolus is slightly ill-defined though this is a suboptimal view for assessment of the distal fibula. IMPRESSION: Questionable ill definition at the tip of the lateral malleolus versus limitations of foot radiographs in assessing distal fibula; dedicated RIGHT ankle radiographs recommended to exclude bone destruction of the lateral malleolar tip. No additional RIGHT foot abnormalities. Electronically Signed   By: Ulyses Southward M.D.   On: 01/02/2018 13:18    Procedures Procedures (including critical care time)  Medications Ordered in ED Medications  oxyCODONE-acetaminophen (PERCOCET/ROXICET) 5-325 MG per tablet 1 tablet (1 tablet Oral Given 01/02/18 1507)      Initial Impression / Assessment and Plan / ED Course  I have reviewed the triage vital signs and the nursing notes.  Pertinent labs & imaging results that were available during my care of the patient were reviewed by me and considered in my medical decision making (see chart for details).     Patient's wound looks pretty good.  I do not see any obvious purulent drainage and her other objective findings such as white blood cell count, lactic acid, and temperature all unremarkable.  Orthopedics has been consulted and feels like her wound is healing and she can follow-up with Dr. Lajoyce Corners as an outpatient.  Sutures to be removed by nurse. Will re-dress wound with non-adherent dressing.  Final Clinical Impressions(s) / ED Diagnoses   Final diagnoses:  Post-operative pain    ED Discharge Orders    None       Pricilla Loveless, MD 01/02/18 1554

## 2018-01-02 NOTE — Telephone Encounter (Signed)
FYI-  Patient called stating that she was seen at [facility] ED today and staples were removed from her right foot except for 2.  Patient has an appt.scheduled for 01/09/18 for right foot.

## 2018-01-09 ENCOUNTER — Ambulatory Visit (INDEPENDENT_AMBULATORY_CARE_PROVIDER_SITE_OTHER): Payer: Medicaid Other | Admitting: Orthopedic Surgery

## 2018-01-09 ENCOUNTER — Encounter (INDEPENDENT_AMBULATORY_CARE_PROVIDER_SITE_OTHER): Payer: Self-pay | Admitting: Orthopedic Surgery

## 2018-01-09 DIAGNOSIS — L02611 Cutaneous abscess of right foot: Secondary | ICD-10-CM

## 2018-01-09 MED ORDER — OXYCODONE-ACETAMINOPHEN 10-325 MG PO TABS: 1.0000 | ORAL_TABLET | Freq: Three times a day (TID) | ORAL | 0 refills | Status: DC | PRN

## 2018-01-09 MED ORDER — IBUPROFEN 800 MG PO TABS: 800.0000 mg | ORAL_TABLET | Freq: Three times a day (TID) | ORAL | 0 refills | Status: AC | PRN

## 2018-01-09 NOTE — Progress Notes (Signed)
Office Visit Note   Patient: Stacey Martinez           Date of Birth: 10-11-1980           MRN: [id number] Visit Date: 01/09/2018              Requested by: Arlie Solomons, MD [address] [place], [place] [address] PCP: Arlie Solomons, MD  Chief Complaint  Patient presents with  . Right Ankle - Follow-up      HPI: Patient presents for follow-up status post debridement of the right foot.  Patient states that she is keeping her foot elevated wearing a compression sock she states that she is now drug-free.  Assessment & Plan: Visit Diagnoses:  1. Abscess of right foot     Plan: We will refill prescription for ibuprofen and for the Percocet.  She will get a pair of large compression stockings she is currently wearing double extra-large which are not providing enough compression.  Continue with current care  Follow-Up Instructions: Return in about 2 weeks (around 01/23/2018).   Ortho Exam  Patient is alert, oriented, no adenopathy, well-dressed, normal affect, normal respiratory effort. Examination patient's wound does show improvement she has excellent granulation tissue approximately 90% there is no cellulitis no drainage there is still some swelling no signs of infection.  Imaging: No results found. No images are attached to the encounter.  Labs: Lab Results  Component Value Date   HGBA1C 5.7 (H) 11/24/2017   ESRSEDRATE 82 (H) 11/19/2017   REPTSTATUS 11/27/2017 FINAL 11/22/2017   GRAMSTAIN  11/22/2017    ABUNDANT WBC PRESENT, PREDOMINANTLY PMN FEW GRAM POSITIVE COCCI IN PAIRS IN CHAINS    CULT  11/22/2017    RARE GROUP A STREP (S.PYOGENES) ISOLATED NO ANAEROBES ISOLATED Performed at [facility], [address], [place], [place] [address]      Lab Results  Component Value Date   ALBUMIN 4.0 01/02/2018   ALBUMIN 2.1 (L) 11/27/2017   ALBUMIN 2.0 (L) 11/26/2017    There is no height or weight on file to calculate BMI.  Orders:  No orders  of the defined types were placed in this encounter.  Meds ordered this encounter  Medications  . oxyCODONE-acetaminophen (PERCOCET) 10-325 MG tablet    Sig: Take 1 tablet by mouth every 8 (eight) hours as needed for pain.    Dispense:  20 tablet    Refill:  0  . ibuprofen (ADVIL,MOTRIN) 800 MG tablet    Sig: Take 1 tablet (800 mg total) by mouth every 8 (eight) hours as needed.    Dispense:  90 tablet    Refill:  0     Procedures: No procedures performed  Clinical Data: No additional findings.  ROS:  All other systems negative, except as noted in the HPI. Review of Systems  Objective: Vital Signs: There were no vitals taken for this visit.  Specialty Comments:  No specialty comments available.  PMFS History: Patient Active Problem List   Diagnosis Date Noted  . Abscess of right foot   . Abscess of tendon sheath, right ankle and foot   . Cellulitis of right ankle   . Leukocytosis 11/19/2017  . IVDU (intravenous drug user) 11/19/2017  . Anxiety and depression 11/19/2017  . Cellulitis 11/18/2017   Past Medical History:  Diagnosis Date  . Hx of degenerative disc disease   . Substance abuse (HCC)     History reviewed. No pertinent family history.  Past Surgical  History:  Procedure Laterality Date  . I&D EXTREMITY Right 11/22/2017   Procedure: RIGHT ANKLE DEBRIDEMENT AND PLACEMENT OF WOUND VAC  Drainege of Abcess left arm;  Surgeon: Nadara Mustard, MD;  Location: [facility] OR;  Service: Orthopedics;  Laterality: Right;  . I&D EXTREMITY Right 11/27/2017   Procedure: REPEAT DEBRIDEMENT OF THE RIGHT FOOT, WOUND CLOSURE;  Surgeon: Nadara Mustard, MD;  Location: [facility] OR;  Service: Orthopedics;  Laterality: Right;  . INCISION AND DRAINAGE ABSCESS Right 11/22/2017   Procedure: INCISION AND DRAINAGE ABSCESS NECK ABCESS;  Surgeon: Nadara Mustard, MD;  Location: [facility] OR;  Service: Orthopedics;  Laterality: Right;  . IRRIGATION AND DEBRIDEMENT ABSCESS  11/22/2017   Procedure: IRRIGATION AND  DEBRIDEMENT ABSCESS Neck;  Surgeon: Axel Filler, MD;  Location: [facility] OR;  Service: General;;  . TEE WITHOUT CARDIOVERSION N/A 11/29/2017   Procedure: TRANSESOPHAGEAL ECHOCARDIOGRAM (TEE);  Surgeon: Pricilla Riffle, MD;  Location: [facility] ENDOSCOPY;  Service: Cardiovascular;  Laterality: N/A;   Social History   Occupational History  . Not on file  Tobacco Use  . Smoking status: Current Every Day Smoker    Types: Cigarettes  . Smokeless tobacco: Current User  Substance and Sexual Activity  . Alcohol use: Not Currently  . Drug use: Yes  . Sexual activity: Not on file

## 2018-01-09 NOTE — Addendum Note (Signed)
Addended by: Aldean Baker on: 01/09/2018 05:02 PM   Modules accepted: Orders

## 2018-01-13 ENCOUNTER — Ambulatory Visit (INDEPENDENT_AMBULATORY_CARE_PROVIDER_SITE_OTHER): Payer: Medicaid Other | Admitting: Orthopedic Surgery

## 2018-01-14 ENCOUNTER — Other Ambulatory Visit (HOSPITAL_COMMUNITY): Payer: Self-pay | Admitting: Student

## 2018-01-14 DIAGNOSIS — R52 Pain, unspecified: Secondary | ICD-10-CM

## 2018-01-15 ENCOUNTER — Ambulatory Visit (HOSPITAL_COMMUNITY)
Admission: RE | Admit: 2018-01-15 | Discharge: 2018-01-15 | Disposition: A | Discharge status: Home / Self Care | Payer: Medicaid Other | Source: Ambulatory Visit | Attending: Student | Admitting: Student

## 2018-01-15 DIAGNOSIS — M542 Cervicalgia: Secondary | ICD-10-CM | POA: Insufficient documentation

## 2018-01-15 DIAGNOSIS — R52 Pain, unspecified: Secondary | ICD-10-CM | POA: Diagnosis not present

## 2018-01-15 NOTE — Progress Notes (Signed)
BUE venous duplex limited study (neck only): No evidence of DVT in the IJV or subclavian veins. At area of knot (right lower neck) no evidence of thrombus or fluid. Farrel DemarkJill Eunice, RDMS, RVT   [facility]

## 2018-01-23 ENCOUNTER — Ambulatory Visit (INDEPENDENT_AMBULATORY_CARE_PROVIDER_SITE_OTHER): Payer: Medicaid Other | Admitting: Orthopedic Surgery

## 2018-01-23 ENCOUNTER — Telehealth (INDEPENDENT_AMBULATORY_CARE_PROVIDER_SITE_OTHER): Payer: Self-pay | Admitting: Orthopedic Surgery

## 2018-01-23 NOTE — Telephone Encounter (Signed)
Patient couldn't make it to her appointment today due to traffic, but would like to come pick up a RX for her percocet later this afternoon. CB # [phone]

## 2018-01-23 NOTE — Telephone Encounter (Signed)
Called pt and advised that we can not give rx for percocet she needs to keep her appt for eval on the 26th. Pt voiced understanding and will call with any other questions.

## 2018-01-23 NOTE — Telephone Encounter (Signed)
Can you open a spot for 3:45 pm today please for Dr. Lajoyce Cornersuda?

## 2018-01-23 NOTE — Telephone Encounter (Signed)
Med Refill  Percoet 10 Pain med pt is currently out of med

## 2018-01-29 ENCOUNTER — Encounter (INDEPENDENT_AMBULATORY_CARE_PROVIDER_SITE_OTHER): Payer: Self-pay | Admitting: Family

## 2018-01-29 ENCOUNTER — Ambulatory Visit (INDEPENDENT_AMBULATORY_CARE_PROVIDER_SITE_OTHER): Payer: Medicaid Other | Admitting: Family

## 2018-01-29 VITALS — Ht 64.0 in | Wt 152.0 lb

## 2018-01-29 DIAGNOSIS — L02611 Cutaneous abscess of right foot: Secondary | ICD-10-CM

## 2018-01-29 MED ORDER — OXYCODONE-ACETAMINOPHEN 10-325 MG PO TABS: 1.0000 | ORAL_TABLET | Freq: Three times a day (TID) | ORAL | 0 refills | Status: DC | PRN

## 2018-01-29 NOTE — Progress Notes (Signed)
Office Visit Note   Patient: Stacey Martinez           Date of Birth: [birth year]           MRN: [id number] Visit Date: 01/29/2018              Requested by: Arlie Solomons, MD [address] [place], [place] [address] PCP: Arlie Solomons, MD  Chief Complaint  Patient presents with  . Right Foot - Routine Post Op    11/27/17 repeat I&D right foot abscess       HPI: Patient presents for follow-up status post debridement of the right foot.  Patient states that she is keeping her foot elevated wearing a compression sock she states that she is now drug-free. Is not wearing the sock today.   Complains of pain, states pain medication is insufficient.  Assessment & Plan: Visit Diagnoses:  1. Abscess of right foot     Plan: We will refill prescription for the Percocet.  She will continue the compression stockings. Continue with current care  Follow-Up Instructions: Return in about 10 years (around 01/30/2028).   Ortho Exam  Patient is alert, oriented, no adenopathy, well-dressed, normal affect, normal respiratory effort. Examination patient's wound does show improvement. she has excellent granulation tissue approximately 100% there is no cellulitis no drainage. no signs of infection.  Imaging: No results found. No images are attached to the encounter.    Labs: Lab Results  Component Value Date   HGBA1C 5.7 (H) 11/24/2017   ESRSEDRATE 82 (H) 11/19/2017   REPTSTATUS 11/27/2017 FINAL 11/22/2017   GRAMSTAIN  11/22/2017    ABUNDANT WBC PRESENT, PREDOMINANTLY PMN FEW GRAM POSITIVE COCCI IN PAIRS IN CHAINS    CULT  11/22/2017    RARE GROUP A STREP (S.PYOGENES) ISOLATED NO ANAEROBES ISOLATED Performed at [facility], [address], [place], [place] [address]      Lab Results  Component Value Date   ALBUMIN 4.0 01/02/2018   ALBUMIN 2.1 (L) 11/27/2017   ALBUMIN 2.0 (L) 11/26/2017    Body mass index is 26.09 kg/m.  Orders:  No orders of the defined  types were placed in this encounter.  Meds ordered this encounter  Medications  . oxyCODONE-acetaminophen (PERCOCET) 10-325 MG tablet    Sig: Take 1 tablet by mouth every 8 (eight) hours as needed for pain.    Dispense:  20 tablet    Refill:  0     Procedures: No procedures performed  Clinical Data: No additional findings.  ROS:  All other systems negative, except as noted in the HPI. Review of Systems  Constitutional: Negative for chills and fever.    Objective: Vital Signs: Ht 5\' 4"  (1.626 m)   Wt 152 lb (68.9 kg)   BMI 26.09 kg/m   Specialty Comments:  No specialty comments available.  PMFS History: Patient Active Problem List   Diagnosis Date Noted  . Abscess of right foot   . Abscess of tendon sheath, right ankle and foot   . Cellulitis of right ankle   . Leukocytosis 11/19/2017  . IVDU (intravenous drug user) 11/19/2017  . Anxiety and depression 11/19/2017  . Cellulitis 11/18/2017   Past Medical History:  Diagnosis Date  . Hx of degenerative disc disease   . Substance abuse (HCC)     History reviewed. No pertinent family history.  Past Surgical History:  Procedure Laterality Date  . I&D EXTREMITY Right 11/22/2017   Procedure: RIGHT ANKLE DEBRIDEMENT AND  PLACEMENT OF WOUND VAC  Drainege of Abcess left arm;  Surgeon: Nadara Mustarduda, Marcus V, MD;  [facility];  Service: Orthopedics;  Laterality: Right;  . I&D EXTREMITY Right 11/27/2017   Procedure: REPEAT DEBRIDEMENT OF THE RIGHT FOOT, WOUND CLOSURE;  Surgeon: Nadara Mustarduda, Marcus V, MD;  [facility]: [facility] OR;  Service: Orthopedics;  Laterality: Right;  . INCISION AND DRAINAGE ABSCESS Right 11/22/2017   Procedure: INCISION AND DRAINAGE ABSCESS NECK ABCESS;  Surgeon: Nadara Mustarduda, Marcus V, MD;  Location: [facility] OR;  Service: Orthopedics;  Laterality: Right;  . IRRIGATION AND DEBRIDEMENT ABSCESS  11/22/2017   Procedure: IRRIGATION AND DEBRIDEMENT ABSCESS Neck;  Surgeon: Axel Filleramirez, Armando, MD;  [facility];  Service: General;;  . TEE  WITHOUT CARDIOVERSION N/A 11/29/2017   Procedure: TRANSESOPHAGEAL ECHOCARDIOGRAM (TEE);  Surgeon: Pricilla Riffleoss, Paula V, MD;  [facility];  Service: Cardiovascular;  Laterality: N/A;   Social History   Occupational History  . Not on file  Tobacco Use  . Smoking status: Current Every Day Smoker    Types: Cigarettes  . Smokeless tobacco: Current User  Substance and Sexual Activity  . Alcohol use: Not Currently  . Drug use: Yes  . Sexual activity: Not on file

## 2018-01-31 ENCOUNTER — Emergency Department (HOSPITAL_COMMUNITY)
Admission: EM | Admit: 2018-01-31 | Discharge: 2018-02-01 | Disposition: A | Discharge status: Home / Self Care | Payer: Medicaid Other | Attending: Emergency Medicine | Admitting: Emergency Medicine

## 2018-01-31 ENCOUNTER — Emergency Department (HOSPITAL_COMMUNITY): Payer: Medicaid Other

## 2018-01-31 ENCOUNTER — Other Ambulatory Visit: Payer: Self-pay

## 2018-01-31 ENCOUNTER — Encounter (HOSPITAL_COMMUNITY): Payer: Self-pay | Admitting: Emergency Medicine

## 2018-01-31 DIAGNOSIS — Z79899 Other long term (current) drug therapy: Secondary | ICD-10-CM | POA: Insufficient documentation

## 2018-01-31 DIAGNOSIS — F419 Anxiety disorder, unspecified: Secondary | ICD-10-CM | POA: Insufficient documentation

## 2018-01-31 DIAGNOSIS — L02416 Cutaneous abscess of left lower limb: Secondary | ICD-10-CM | POA: Diagnosis not present

## 2018-01-31 DIAGNOSIS — F17210 Nicotine dependence, cigarettes, uncomplicated: Secondary | ICD-10-CM | POA: Insufficient documentation

## 2018-01-31 DIAGNOSIS — R2242 Localized swelling, mass and lump, left lower limb: Secondary | ICD-10-CM | POA: Diagnosis present

## 2018-01-31 DIAGNOSIS — N3001 Acute cystitis with hematuria: Secondary | ICD-10-CM | POA: Diagnosis not present

## 2018-01-31 DIAGNOSIS — F329 Major depressive disorder, single episode, unspecified: Secondary | ICD-10-CM | POA: Insufficient documentation

## 2018-01-31 LAB — COMPREHENSIVE METABOLIC PANEL
ALK PHOS: 68 U/L (ref 38–126)
ALT: 11 U/L — ABNORMAL LOW (ref 14–54)
ANION GAP: 7 (ref 5–15)
AST: 10 U/L — ABNORMAL LOW (ref 15–41)
Albumin: 3.3 g/dL — ABNORMAL LOW (ref 3.5–5.0)
BUN: 13 mg/dL (ref 6–20)
CALCIUM: 8.4 mg/dL — AB (ref 8.9–10.3)
CHLORIDE: 111 mmol/L (ref 101–111)
CO2: 25 mmol/L (ref 22–32)
Creatinine, Ser: 0.74 mg/dL (ref 0.44–1.00)
GFR calc non Af Amer: >60 mL/min (ref >60)
Glucose, Bld: 100 mg/dL — ABNORMAL HIGH (ref 65–99)
Potassium: 3.7 mmol/L (ref 3.5–5.1)
SODIUM: 143 mmol/L (ref 135–145)
Total Bilirubin: 0.4 mg/dL (ref 0.3–1.2)
Total Protein: 6.2 g/dL — ABNORMAL LOW (ref 6.5–8.1)

## 2018-01-31 LAB — URINALYSIS, ROUTINE W REFLEX MICROSCOPIC
GLUCOSE, UA: NEGATIVE mg/dL
Ketones, ur: NEGATIVE mg/dL
NITRITE: NEGATIVE
PH: 5 (ref 5.0–8.0)
Protein, ur: 100 mg/dL — AB
RBC / HPF: >50 RBC/hpf — ABNORMAL HIGH (ref 0–5)
SPECIFIC GRAVITY, URINE: 1.043 — AB (ref 1.005–1.030)

## 2018-01-31 LAB — CBC WITH DIFFERENTIAL/PLATELET
ABS IMMATURE GRANULOCYTES: 0 10*3/uL (ref 0.0–0.1)
BASOS PCT: 0 %
Basophils Absolute: 0 10*3/uL (ref 0.0–0.1)
Eosinophils Absolute: 0.2 10*3/uL (ref 0.0–0.7)
Eosinophils Relative: 2 %
HCT: 42.6 % (ref 36.0–46.0)
Hemoglobin: 13.1 g/dL (ref 12.0–15.0)
IMMATURE GRANULOCYTES: 1 %
Lymphocytes Relative: 24 %
Lymphs Abs: 2 10*3/uL (ref 0.7–4.0)
MCH: 28.4 pg (ref 26.0–34.0)
MCHC: 30.8 g/dL (ref 30.0–36.0)
MCV: 92.2 fL (ref 78.0–100.0)
Monocytes Absolute: 0.4 10*3/uL (ref 0.1–1.0)
Monocytes Relative: 5 %
NEUTROS ABS: 5.9 10*3/uL (ref 1.7–7.7)
NEUTROS PCT: 68 %
PLATELETS: 233 10*3/uL (ref 150–400)
RBC: 4.62 MIL/uL (ref 3.87–5.11)
RDW: 14.6 % (ref 11.5–15.5)
WBC: 8.6 10*3/uL (ref 4.0–10.5)

## 2018-01-31 LAB — RAPID URINE DRUG SCREEN, HOSP PERFORMED
AMPHETAMINES: POSITIVE — AB
Benzodiazepines: NOT DETECTED
COCAINE: NOT DETECTED
Opiates: POSITIVE — AB
Tetrahydrocannabinol: NOT DETECTED

## 2018-01-31 LAB — I-STAT CG4 LACTIC ACID, ED
LACTIC ACID, VENOUS: 1.07 mmol/L (ref 0.5–1.9)
LACTIC ACID, VENOUS: 0.8 mmol/L (ref 0.5–1.9)

## 2018-01-31 MED ORDER — LIDOCAINE-EPINEPHRINE (PF) 2 %-1:200000 IJ SOLN
20.0000 mL | Freq: Once | INTRAMUSCULAR | Status: AC
  Administered 2018-01-31: 20 mL
  Filled 2018-01-31: qty 20

## 2018-01-31 MED ORDER — SODIUM CHLORIDE 0.9 % IV SOLN
1.0000 g | Freq: Once | INTRAVENOUS | Status: AC
  Administered 2018-01-31: 1 g via INTRAVENOUS
  Filled 2018-01-31: qty 10

## 2018-01-31 MED ORDER — OXYCODONE-ACETAMINOPHEN 5-325 MG PO TABS
2.0000 | ORAL_TABLET | Freq: Once | ORAL | Status: AC
  Administered 2018-01-31: 2 via ORAL
  Filled 2018-01-31: qty 2

## 2018-01-31 MED ORDER — GENERIC EXTERNAL MEDICATION
Status: DC
Start: ? — End: 2018-01-31

## 2018-01-31 MED ORDER — SODIUM CHLORIDE 0.9 % IV SOLN
10.00 | INTRAVENOUS | Status: DC
Start: ? — End: 2018-01-31

## 2018-01-31 MED ORDER — KETOROLAC TROMETHAMINE 30 MG/ML IJ SOLN
30.0000 mg | Freq: Once | INTRAMUSCULAR | Status: AC
  Administered 2018-01-31: 30 mg via INTRAVENOUS
  Filled 2018-01-31: qty 1

## 2018-01-31 MED ORDER — SODIUM CHLORIDE 0.9 % IV BOLUS
1000.0000 mL | Freq: Once | INTRAVENOUS | Status: AC
  Administered 2018-01-31: 1000 mL via INTRAVENOUS

## 2018-01-31 NOTE — Discharge Instructions (Signed)
Thank you for allowing me to provide your care today in the emergency department.  Please fill the prescription of Keflex that you were prescribed yesterday in the ED.  To care for your  Abscess, clean the area at least once daily with warm soap and water.  Then apply a topical antibiotic such as Neosporin and cover the area with the wound.  Please remove the packing material in 48 hours.  If the wound continues to have thick, drainage that looks like mucus that is concerning for infection, you can repack the wound every 24-48 hours until that stops.  The wound is not significantly worsen after you have been taking your Keflex at home for 48 to 72 hours.  Take 650 mg of Tylenol or 600 mg of ibuprofen with food every 6 hours for pain control.  Apply ice for 15 to 20 minutes up to 3-4 times a day.  Your pain should significantly improve as your infection improves.  Please keep your follow-up appointment with [facility] on June 26.  Return to the emergency department if you develop high fever, profuse vomiting, worsening redness and swelling despite taking antibiotics, or other new, concerning symptoms.

## 2018-01-31 NOTE — ED Triage Notes (Signed)
Pt has hx of cellulitis to right foot, sees MD Lajoyce Cornersuda for this. Pt states she is here today for evaluation of a new abscess that she has had for 5 days to the left ankle. [facility] for the left leg cellulitis and for a UTI and kidney stone, states she had a dose of IV Rocephin yesterday and was prescribed 2 yesterday that she has not gotten filled or taken yet. She was prescribed oxycodone for the pain to right foot but states this is not helping the pain to the left leg.

## 2018-01-31 NOTE — ED Notes (Signed)
Patient transported to X-ray 

## 2018-01-31 NOTE — ED Provider Notes (Signed)
[facility] EMERGENCY DEPARTMENT Provider Note   CSN: [id number] Arrival date & time: 01/31/18  1353     History   Chief Complaint Chief Complaint  Patient presents with  . Abscess    HPI Stacey Martinez is a 37 y.o. female with a history of IVDU, which she reports that she is stopped within the last few months, and pyelonephritis who presents to the emergency department with a chief complaint of left ankle abscess.  The patient reports worsening pain, redness, and swelling to the left ankle.  She reports that the area has not been draining.  She reports that she was seen 2 days ago by [facility].  The patient's story is somewhat confusing as she initially reports that [facility] is expecting a call about her after she has been roomed in the emergency department.  She later states that she was told to come to the emergency department after she was seen by [facility] 2 days ago due to the abscess on her left ankle.  After I have reviewed the patient's chart and the note from the patient's orthopedic visit 2 days ago, the patient now states that she forgot to mention the area on her left ankle which she was seen by orthopedics on June 19.  She denies fever or chills.  She reports that yesterday she was seen in the emergency department and states " they did nothing for me.  They did not even want to touch my ankle.  They gave me 1 dose of IV antibiotics and sent me home."  I review of the patient's medical records also indicates that the patient was seen by her PCP earlier that morning who advised her to go to the ED for follow-up of the wound on her left ankle.  She also  complains that her urine has been dark for the last few days.  She reports that they did a CT scan in the ED yesterday and saw a stone in her kidney.  She states " they told me it should not be causing me a lot of pain because it was in my kidney, but it is causing me a lot of  pain."  She denies nausea, vomiting, diarrhea, constipation, vaginal bleeding or discharge.  She reports that she has not filled the prescription of Keflex that was given to her yesterday in the emergency department because her pharmacy was closed.  Patient reports that she was seen yesterday at [facility] in the ED and that [place]  The history is provided by the patient. No language interpreter was used.    Past Medical History:  Diagnosis Date  . Hx of degenerative disc disease   . Substance abuse [facility])     Patient Active Problem List   Diagnosis Date Noted  . Abscess of right foot   . Abscess of tendon sheath, right ankle and foot   . Cellulitis of right ankle   . Leukocytosis 11/19/2017  . IVDU (intravenous drug user) 11/19/2017  . Anxiety and depression 11/19/2017  . Cellulitis 11/18/2017    Past Surgical History:  Procedure Laterality Date  . I&D EXTREMITY Right 11/22/2017   Procedure: RIGHT ANKLE DEBRIDEMENT AND PLACEMENT OF WOUND VAC  Drainege of Abcess left arm;  Surgeon: Nadara Mustard, MD;  [facility];  Service: Orthopedics;  Laterality: Right;  . I&D EXTREMITY Right 11/27/2017   Procedure: REPEAT DEBRIDEMENT OF THE RIGHT FOOT, WOUND CLOSURE;  Surgeon:  Nadara Mustarduda, Marcus V, MD;  [facility];  Service: Orthopedics;  Laterality: Right;  . INCISION AND DRAINAGE ABSCESS Right 11/22/2017   Procedure: INCISION AND DRAINAGE ABSCESS NECK ABCESS;  Surgeon: Nadara Mustarduda, Marcus V, MD;  Location: [facility] OR;  Service: Orthopedics;  Laterality: Right;  . IRRIGATION AND DEBRIDEMENT ABSCESS  11/22/2017   Procedure: IRRIGATION AND DEBRIDEMENT ABSCESS Neck;  Surgeon: Axel Filleramirez, Armando, MD;  [facility];  Service: General;;  . TEE WITHOUT CARDIOVERSION N/A 11/29/2017   Procedure: TRANSESOPHAGEAL ECHOCARDIOGRAM (TEE);  Surgeon: Pricilla Riffleoss, Paula V, MD;  [facility];  Service: Cardiovascular;  Laterality: N/A;     OB History   None      Home Medications      Prior to Admission medications   Medication Sig Start Date End Date Taking? Authorizing Provider  ALPRAZolam Prudy Feeler(XANAX) 1 MG tablet Take 1 mg by mouth daily as needed for anxiety.   Yes [provider]  FLUoxetine (PROZAC) 40 MG capsule Take 40 mg by mouth daily.   Yes [provider]  gabapentin (NEURONTIN) 600 MG tablet Take 1 tablet (600 mg total) by mouth 4 (four) times daily. 12/02/17  Yes Tyrone NineGrunz, Ryan B, MD  ibuprofen (ADVIL,MOTRIN) 800 MG tablet Take 1 tablet (800 mg total) by mouth every 8 (eight) hours as needed. 01/09/18  Yes Nadara Mustarduda, Marcus V, MD  methocarbamol (ROBAXIN) 500 MG tablet Take 1,500-2,000 mg by mouth 4 (four) times daily. 10/16/17  Yes [provider]  nicotine (NICODERM CQ - DOSED IN MG/24 HOURS) 21 mg/24hr patch Place 1 patch onto the skin daily as needed. 12/27/17  Yes [provider]  oxyCODONE-acetaminophen (PERCOCET) 10-325 MG tablet Take 1 tablet by mouth every 8 (eight) hours as needed for pain. 01/29/18  Yes Adonis HugueninZamora, Erin R, NP  QUEtiapine (SEROQUEL) 25 MG tablet Take 1 tablet (25 mg total) by mouth at bedtime. 12/02/17  Yes Tyrone NineGrunz, Ryan B, MD  traZODone (DESYREL) 100 MG tablet Take 200 mg by mouth at bedtime.   Yes [provider]    Family History No family history on file.  Social History Social History   Tobacco Use  . Smoking status: Current Every Day Smoker    Types: Cigarettes  . Smokeless tobacco: Current User  Substance Use Topics  . Alcohol use: Not Currently  . Drug use: Yes     Allergies   Phenytoin sodium extended; Yellow dye; and Doxycycline   Review of Systems Review of Systems  Constitutional: Negative for activity change, chills and fever.  Respiratory: Negative for shortness of breath.   Cardiovascular: Negative for chest pain.  Gastrointestinal: Negative for abdominal pain, diarrhea, nausea and vomiting.  Genitourinary: Negative for dysuria and hematuria.  Musculoskeletal: Positive for  arthralgias and myalgias. Negative for back pain, gait problem and joint swelling.  Skin: Positive for color change and wound. Negative for rash.  Allergic/Immunologic: Negative for immunocompromised state.  Neurological: Negative for numbness and headaches.  Psychiatric/Behavioral: Negative for confusion.   Physical Exam Updated Vital Signs BP 120/71   Pulse (!) 56   Temp 98 F (36.7 C) (Oral)   Resp 16   Ht 5\' 4"  (1.626 m)   Wt 72.6 kg (160 lb)   SpO2 98%   BMI 27.46 kg/m   Physical Exam  Constitutional: No distress.  HENT:  Head: Normocephalic.  Eyes: Conjunctivae are normal.  Neck: Neck supple.  Cardiovascular: Normal rate, regular rhythm, normal heart sounds and intact distal pulses. Exam reveals no gallop and no friction  rub.  No murmur heard. Pulmonary/Chest: Effort normal. No stridor. No respiratory distress. She has no wheezes. She has no rales. She exhibits no tenderness.  Abdominal: Soft. Bowel sounds are normal. She exhibits no distension and no mass. There is no tenderness. There is no rebound and no guarding. No hernia.  Abdomen is soft, nontender, nondistended.  No peritoneal signs.  No CVA tenderness bilaterally.  Musculoskeletal: Normal range of motion. She exhibits no edema, tenderness or deformity.  3 cm area of erythema, edema, and warmth with central fluctuance noted to the left lower leg, superior to the medial aspect of the ankle.  No active drainage.  No drainage is able to be expressed.  DP and PT pulses are 2+ and symmetric.  Full active and passive range of motion of the left ankle and knee.  Sensation is intact and symmetric throughout.  No red streaking.  There is a well-healing wound to the right ankle with pink granulation tissue.  Neurological: She is alert.  Skin: Skin is warm. No rash noted.  Psychiatric: Her behavior is normal.  Nursing note and vitals reviewed.         ED Treatments / Results  Labs (all labs ordered are listed, but  only abnormal results are displayed) Labs Reviewed  COMPREHENSIVE METABOLIC PANEL - Abnormal; Notable for the following components:      Result Value   Glucose, Bld 100 (*)    Calcium 8.4 (*)    Total Protein 6.2 (*)    Albumin 3.3 (*)    AST 10 (*)    ALT 11 (*)    All other components within normal limits  URINALYSIS, ROUTINE W REFLEX MICROSCOPIC - Abnormal; Notable for the following components:   APPearance HAZY (*)    Specific Gravity, Urine 1.043 (*)    Hgb urine dipstick MODERATE (*)    Bilirubin Urine MODERATE (*)    Protein, ur 100 (*)    Leukocytes, UA TRACE (*)    RBC / HPF >50 (*)    Bacteria, UA RARE (*)    All other components within normal limits  RAPID URINE DRUG SCREEN, HOSP PERFORMED - Abnormal; Notable for the following components:   Opiates POSITIVE (*)    Amphetamines POSITIVE (*)    Barbiturates   (*)    Value: Result not available. Reagent lot number recalled by manufacturer.   All other components within normal limits  URINE CULTURE  CBC WITH DIFFERENTIAL/PLATELET  I-STAT CG4 LACTIC ACID, ED  I-STAT CG4 LACTIC ACID, ED    EKG None  Radiology Dg Ankle Complete Left  Result Date: 01/31/2018 CLINICAL DATA:  Left ankle infection. Redness and swelling medially. EXAM: LEFT ANKLE COMPLETE - 3+ VIEW COMPARISON:  None. FINDINGS: There is moderate soft tissue swelling about the medial aspect of the ankle. No radiopaque foreign body, dissecting soft tissue emphysema, acute fracture, dislocation, or osseous erosion is identified. There is mild dorsal spurring of the navicular. IMPRESSION: Soft tissue swelling without acute osseous abnormality. Electronically Signed   By: Sebastian Ache M.D.   On: 01/31/2018 20:01    Procedures EMERGENCY DEPARTMENT US SOFT TISSUE INTERPRETATION "Study: Limited Soft Tissue Ultrasound"  INDICATIONS: Soft tissue infection Multiple views of the body part were obtained in real-time with a multi-frequency linear probe  PERFORMED BY:  Myself IMAGES ARCHIVED?: Yes SIDE:Left BODY PART:Lower extremity INTERPRETATION:  Abcess present   .Marland KitchenIncision and Drainage Date/Time: 01/31/2018 11:16 PM Performed by: Barkley Boards, PA-C Authorized by: Barkley Boards,  PA-C   Consent:    Consent obtained:  Verbal   Consent given by:  Patient   Risks discussed:  Bleeding, pain, infection and incomplete drainage   Alternatives discussed:  No treatment Location:    Type:  Abscess   Size:  3 cm   Location:  Lower extremity   Lower extremity location:  Leg   Leg location:  L lower leg Pre-procedure details:    Skin preparation:  Antiseptic wash Anesthesia (see MAR for exact dosages):    Anesthesia method:  Local infiltration   Local anesthetic:  Lidocaine 2% WITH epi Procedure type:    Complexity:  Simple Procedure details:    Incision types:  Single straight   Incision depth:  Dermal   Scalpel blade:  11   Wound management:  Probed and deloculated and extensive cleaning   Drainage:  Bloody and purulent   Drainage amount:  Moderate   Packing materials:  1/4 in iodoform gauze Post-procedure details:    Patient tolerance of procedure:  Tolerated well, no immediate complications   (including critical care time)  Medications Ordered in ED Medications  sodium chloride 0.9 % bolus 1,000 mL (1,000 mLs Intravenous New Bag/Given 01/31/18 2231)  oxyCODONE-acetaminophen (PERCOCET/ROXICET) 5-325 MG per tablet 2 tablet (2 tablets Oral Given 01/31/18 1941)  cefTRIAXone (ROCEPHIN) 1 g in sodium chloride 0.9 % 100 mL IVPB (1 g Intravenous New Bag/Given 01/31/18 2230)  lidocaine-EPINEPHrine (XYLOCAINE W/EPI) 2 %-1:200000 (PF) injection 20 mL (20 mLs Infiltration Given by Other 01/31/18 2234)  ketorolac (TORADOL) 30 MG/ML injection 30 mg (30 mg Intravenous Given 01/31/18 2255)     Initial Impression / Assessment and Plan / ED Course  I have reviewed the triage vital signs and the nursing notes.  Pertinent labs & imaging results that  were available during my care of the patient were reviewed by me and considered in my medical decision making (see chart for details).     37 year old female with a history of IVDU, which she reports that she is stopped within the last few months, and pyelonephritis who presents to the emergency department with a chief complaint of left lower leg abscess and dark-colored urine.  On exam, there is erythema, edema, warmth noted to the left lower leg.  Bedside ultrasound with fluid collection consistent with abscess.  Incision and drainage performed with moderate amount of purulent discharge.  The wound was packed and a sterile dressing with bacitracin was placed.  Labs are reassuring.  No leukocytosis; however, this is likely secondary to the fact that the patient received a dose of IV Rocephin in the ED yesterday.  UA with hemoglobinuria, bilirubinuria, and concerning for infection.  Urine culture sent.  IV Rocephin given in the ED.  The patient had a CT abdomen pelvis performed yesterday in the ED therefore additional imaging is not indicated at this time.  She was given her home dose of Percocet for pain control.  The patient continued to state that her home pain medication was not controlling her pain.  The patient's story about what brought her to the ED also change several times throughout our interactions.  Please see HPI for more details.  There is some component of drug-seeking behavior.  The patient continued to request IV pain medication and ask about when her IV would get placed.  She was requesting IV Dilaudid.  I discussed that would be inappropriate to treat a superficial abscess with IV opioids.  Toradol was given.  The patient was also  treated with an IV fluid bolus.  I have recommended that she follow-up with her PCP when they reopen in 3 days to have her urine rechecked.  She has not filled the prescription of Keflex that was given to her yesterday in the ED.  I have encouraged her to  fill this medication and start the medication tomorrow morning.  Doubt osteomyelitis or myositis.  She does not meet sepsis criteria at this time.  Strict return precautions given.  The patient is hemodynamically stable and in no acute distress.  She is safe for discharge home as soon as her medications are complete.   Final Clinical Impressions(s) / ED Diagnoses   Final diagnoses:  Abscess of left lower leg  Acute cystitis with hematuria    ED Discharge Orders    None       Barkley Boards, PA-C 01/31/18 2327    Wynetta Fines, MD 02/02/18 1654

## 2018-01-31 NOTE — ED Provider Notes (Signed)
Patient placed in Quick Look pathway, seen and evaluated   Chief Complaint: abscess  HPI:   Pt reports developing abscess to left ankle- Reports fevers at home. [facility] for warm compress and antibiotics. Chronic wound on right foot, followed by Dr. Lajoyce Cornersuda.   ROS: abscess (one)  Physical Exam:   Gen: No distress  Neuro: Awake and Alert  Skin: Warm    Focused Exam: Abscess to left ankle          Initiation of care has begun. The patient has been counseled on the process, plan, and necessity for staying for the completion/evaluation, and the remainder of the medical screening examination    Eyvonne MechanicHedges, Azalee Weimer, Cordelia Poche-C 01/31/18 1422    Margarita Grizzleay, Danielle, MD 02/03/18 1205

## 2018-02-05 ENCOUNTER — Ambulatory Visit (INDEPENDENT_AMBULATORY_CARE_PROVIDER_SITE_OTHER): Payer: Medicaid Other | Admitting: Family

## 2018-02-10 ENCOUNTER — Encounter (INDEPENDENT_AMBULATORY_CARE_PROVIDER_SITE_OTHER): Payer: Self-pay | Admitting: Orthopedic Surgery

## 2018-02-10 ENCOUNTER — Ambulatory Visit (INDEPENDENT_AMBULATORY_CARE_PROVIDER_SITE_OTHER): Payer: Medicaid Other | Admitting: Orthopedic Surgery

## 2018-02-10 VITALS — Ht 64.0 in | Wt 160.0 lb

## 2018-02-10 DIAGNOSIS — L02611 Cutaneous abscess of right foot: Secondary | ICD-10-CM

## 2018-02-10 NOTE — Progress Notes (Signed)
Office Visit Note   Patient: Stacey Martinez           Date of Birth: 1981/05/26           MRN: [id number] Visit Date: 02/10/2018              Requested by: Arlie Solomons, MD [address] [place], [place] [address] PCP: Arlie Solomons, MD  Chief Complaint  Patient presents with  . Right Foot - Routine Post Op    11/27/17 right foot repeat I&D abscess   . Left Ankle - Pain    Follow up ER visit for left ankle abscess 01/31/18      HPI: Patient is a 37 year old woman status post surgical intervention for an abscess right foot.  Patient is currently on Keflex and Bactrim DS.  She is wearing a medical compression stocking.  She states she has been to pain management and radiographs have shown some retained staples.  Assessment & Plan: Visit Diagnoses:  1. Abscess of right foot     Plan: Discussed that we will leave the staples in place.  There is no benefit of going into the wound to try to find the staples.  There are not visible they would not be a source of pain.  Her calf measures 35 cm in circumference she is wearing an extra-large compression stocking discussed that we need to get her into a medium stocking for proper compression.  Follow-Up Instructions: Return in about 1 month (around 03/10/2018).   Ortho Exam  Patient is alert, oriented, no adenopathy, well-dressed, normal affect, normal respiratory effort. Examination the wound has excellent granulation tissue there is good superficial epithelialization and there is no cellulitis no odor no signs of infection.  Imaging: No results found.   Labs: Lab Results  Component Value Date   HGBA1C 5.7 (H) 11/24/2017   ESRSEDRATE 82 (H) 11/19/2017   REPTSTATUS 11/27/2017 FINAL 11/22/2017   GRAMSTAIN  11/22/2017    ABUNDANT WBC PRESENT, PREDOMINANTLY PMN FEW GRAM POSITIVE COCCI IN PAIRS IN CHAINS    CULT  11/22/2017    RARE GROUP A STREP (S.PYOGENES) ISOLATED NO ANAEROBES ISOLATED Performed at [facility], [address], [place], [place] [address]      Lab Results  Component Value Date   ALBUMIN 3.3 (L) 01/31/2018   ALBUMIN 4.0 01/02/2018   ALBUMIN 2.1 (L) 11/27/2017    Body mass index is 27.46 kg/m.  Orders:  No orders of the defined types were placed in this encounter.  No orders of the defined types were placed in this encounter.    Procedures: No procedures performed  Clinical Data: No additional findings.  ROS:  All other systems negative, except as noted in the HPI. Review of Systems  Objective: Vital Signs: Ht 5\' 4"  (1.626 m)   Wt 160 lb (72.6 kg)   BMI 27.46 kg/m   Specialty Comments:  No specialty comments available.  PMFS History: Patient Active Problem List   Diagnosis Date Noted  . Abscess of right foot   . Abscess of tendon sheath, right ankle and foot   . Cellulitis of right ankle   . Leukocytosis 11/19/2017  . IVDU (intravenous drug user) 11/19/2017  . Anxiety and depression 11/19/2017  . Cellulitis 11/18/2017   Past Medical History:  Diagnosis Date  . Hx of degenerative disc disease   . Substance abuse (HCC)     History reviewed. No pertinent family history.  Past Surgical History:  Procedure Laterality Date  . I&D EXTREMITY Right 11/22/2017   Procedure: RIGHT ANKLE DEBRIDEMENT AND PLACEMENT OF WOUND VAC  Drainege of Abcess left arm;  Surgeon: Nadara Mustarduda, Zanetta Dehaan V, MD;  [facility];  Service: Orthopedics;  Laterality: Right;  . I&D EXTREMITY Right 11/27/2017   Procedure: REPEAT DEBRIDEMENT OF THE RIGHT FOOT, WOUND CLOSURE;  Surgeon: Nadara Mustarduda, Yazmin Locher V, MD;  Location: [facility] OR;  Service: Orthopedics;  Laterality: Right;  . INCISION AND DRAINAGE ABSCESS Right 11/22/2017   Procedure: INCISION AND DRAINAGE ABSCESS NECK ABCESS;  Surgeon: Nadara Mustarduda, Aditya Nastasi V, MD;  Location: [facility] OR;  Service: Orthopedics;  Laterality: Right;  . IRRIGATION AND DEBRIDEMENT ABSCESS  11/22/2017   Procedure: IRRIGATION AND DEBRIDEMENT ABSCESS Neck;  Surgeon: Axel Filleramirez,  Armando, MD;  [facility];  Service: General;;  . TEE WITHOUT CARDIOVERSION N/A 11/29/2017   Procedure: TRANSESOPHAGEAL ECHOCARDIOGRAM (TEE);  Surgeon: Pricilla Riffleoss, Paula V, MD;  [facility];  Service: Cardiovascular;  Laterality: N/A;   Social History   Occupational History  . Not on file  Tobacco Use  . Smoking status: Current Every Day Smoker    Types: Cigarettes  . Smokeless tobacco: Current User  Substance and Sexual Activity  . Alcohol use: Not Currently  . Drug use: Yes  . Sexual activity: Not on file

## 2018-02-21 ENCOUNTER — Telehealth (INDEPENDENT_AMBULATORY_CARE_PROVIDER_SITE_OTHER): Payer: Self-pay | Admitting: Orthopedic Surgery

## 2018-02-21 NOTE — Telephone Encounter (Signed)
Patient called advised she spoke with her pain medicine doctor and he wants the staples out of her right foot immediately. Patient said the staples have been in her foot since April. Patient said her foot is swollen and there is green drainage coming out of the wound. Patient also spoke with Autumn and was advised to [facility]. The number to contact patient is [phone]

## 2018-02-21 NOTE — Telephone Encounter (Signed)
Pt states that she has green drainage oozing from an open are on her ankle and that despite elevation the swelling will not resolve she states that she hs been running a low grade fever and that the pain is beyond control. Advised this late in the afternoon there is not a provider in the office and that she should proceed to the hospital for evaluation

## 2018-03-06 ENCOUNTER — Ambulatory Visit (INDEPENDENT_AMBULATORY_CARE_PROVIDER_SITE_OTHER): Payer: Medicaid Other | Admitting: Orthopedic Surgery

## 2018-03-10 ENCOUNTER — Ambulatory Visit (INDEPENDENT_AMBULATORY_CARE_PROVIDER_SITE_OTHER): Payer: Medicaid Other | Admitting: Orthopedic Surgery

## 2018-03-11 ENCOUNTER — Ambulatory Visit (INDEPENDENT_AMBULATORY_CARE_PROVIDER_SITE_OTHER): Payer: Medicaid Other | Admitting: Orthopedic Surgery

## 2018-03-11 ENCOUNTER — Telehealth (INDEPENDENT_AMBULATORY_CARE_PROVIDER_SITE_OTHER): Payer: Self-pay | Admitting: Orthopedic Surgery

## 2018-03-11 NOTE — Telephone Encounter (Signed)
Patient called advised she is sick and throwing up and can not make her appointment today. Patient said she has a fever of 101 for 2 days.Patient was rescheduled for 03/13/18 at 2:30pm. Patient said her left foot has an  open wound with swelling and redness going up to her ankle. Patient said this is a new wound. Patient said the wound is black.  The number to contact patient is [phone]
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
## 2018-03-12 ENCOUNTER — Ambulatory Visit (INDEPENDENT_AMBULATORY_CARE_PROVIDER_SITE_OTHER): Payer: Medicaid Other

## 2018-03-12 ENCOUNTER — Other Ambulatory Visit (INDEPENDENT_AMBULATORY_CARE_PROVIDER_SITE_OTHER): Payer: Self-pay | Admitting: Orthopedic Surgery

## 2018-03-12 ENCOUNTER — Ambulatory Visit (INDEPENDENT_AMBULATORY_CARE_PROVIDER_SITE_OTHER): Payer: Medicaid Other | Admitting: Orthopedic Surgery

## 2018-03-12 ENCOUNTER — Encounter (INDEPENDENT_AMBULATORY_CARE_PROVIDER_SITE_OTHER): Payer: Self-pay | Admitting: Orthopedic Surgery

## 2018-03-12 VITALS — Ht 64.0 in | Wt 160.0 lb

## 2018-03-12 DIAGNOSIS — L03032 Cellulitis of left toe: Secondary | ICD-10-CM | POA: Diagnosis not present

## 2018-03-12 DIAGNOSIS — L02612 Cutaneous abscess of left foot: Secondary | ICD-10-CM

## 2018-03-12 MED ORDER — CEPHALEXIN 500 MG PO CAPS: 500.0000 mg | ORAL_CAPSULE | Freq: Three times a day (TID) | ORAL | 0 refills | Status: DC

## 2018-03-12 MED ORDER — OXYCODONE-ACETAMINOPHEN 5-325 MG PO TABS: 1.0000 | ORAL_TABLET | Freq: Four times a day (QID) | ORAL | 0 refills | Status: DC | PRN
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
## 2018-03-12 NOTE — Telephone Encounter (Signed)
Can you open at time on Stacey Martinez or Stacey Martinez for appt today at 2:15 please.

## 2018-03-12 NOTE — Telephone Encounter (Signed)
I called pt and she did not go to the ER last night. She states that she did not have transportation but that she is afraid of how her foot is looking and " its spreading" I advised the pt to come in today at 2:15so Dr. Lajoyce Cornersuda can eval and if hospital admission is needed then he can do that directly from the office. I advised the pt of the potential seriousness of this issue and that she needs to arrive today. States that she will be here today at 2:15

## 2018-03-13 ENCOUNTER — Other Ambulatory Visit: Payer: Self-pay

## 2018-03-13 ENCOUNTER — Encounter (HOSPITAL_COMMUNITY): Payer: Self-pay | Admitting: *Deleted

## 2018-03-13 ENCOUNTER — Ambulatory Visit (INDEPENDENT_AMBULATORY_CARE_PROVIDER_SITE_OTHER): Payer: Medicaid Other | Admitting: Orthopedic Surgery

## 2018-03-14 ENCOUNTER — Inpatient Hospital Stay (HOSPITAL_COMMUNITY)
Admission: RE | Admit: 2018-03-14 | Discharge: 2018-03-18 | DRG: 572 | Disposition: A | Discharge status: Home / Self Care | Payer: Medicaid Other | Attending: Orthopedic Surgery | Admitting: Orthopedic Surgery

## 2018-03-14 ENCOUNTER — Encounter (HOSPITAL_COMMUNITY): Payer: Self-pay | Admitting: *Deleted

## 2018-03-14 ENCOUNTER — Inpatient Hospital Stay (HOSPITAL_COMMUNITY): Payer: Medicaid Other | Admitting: Certified Registered"

## 2018-03-14 ENCOUNTER — Encounter (HOSPITAL_COMMUNITY)
Admission: RE | Disposition: A | Discharge status: Home / Self Care | Payer: Self-pay | Source: Home / Self Care | Attending: Orthopedic Surgery

## 2018-03-14 DIAGNOSIS — F17210 Nicotine dependence, cigarettes, uncomplicated: Secondary | ICD-10-CM | POA: Diagnosis present

## 2018-03-14 DIAGNOSIS — Z9102 Food additives allergy status: Secondary | ICD-10-CM | POA: Diagnosis not present

## 2018-03-14 DIAGNOSIS — Z87442 Personal history of urinary calculi: Secondary | ICD-10-CM

## 2018-03-14 DIAGNOSIS — Z87440 Personal history of urinary (tract) infections: Secondary | ICD-10-CM

## 2018-03-14 DIAGNOSIS — Z90710 Acquired absence of both cervix and uterus: Secondary | ICD-10-CM | POA: Diagnosis not present

## 2018-03-14 DIAGNOSIS — F410 Panic disorder [episodic paroxysmal anxiety] without agoraphobia: Secondary | ICD-10-CM | POA: Diagnosis present

## 2018-03-14 DIAGNOSIS — L03032 Cellulitis of left toe: Secondary | ICD-10-CM | POA: Diagnosis present

## 2018-03-14 DIAGNOSIS — F329 Major depressive disorder, single episode, unspecified: Secondary | ICD-10-CM | POA: Diagnosis present

## 2018-03-14 DIAGNOSIS — M1990 Unspecified osteoarthritis, unspecified site: Secondary | ICD-10-CM | POA: Diagnosis present

## 2018-03-14 DIAGNOSIS — Z881 Allergy status to other antibiotic agents status: Secondary | ICD-10-CM

## 2018-03-14 DIAGNOSIS — L02612 Cutaneous abscess of left foot: Secondary | ICD-10-CM | POA: Diagnosis present

## 2018-03-14 DIAGNOSIS — L97529 Non-pressure chronic ulcer of other part of left foot with unspecified severity: Secondary | ICD-10-CM | POA: Diagnosis present

## 2018-03-14 HISTORY — DX: Unspecified convulsions: R56.9

## 2018-03-14 HISTORY — DX: Other specified disorders of muscle: M62.89

## 2018-03-14 HISTORY — DX: Anxiety disorder, unspecified: F41.9

## 2018-03-14 HISTORY — PX: I & D EXTREMITY: SHX5045

## 2018-03-14 HISTORY — DX: Depression, unspecified: F32.A

## 2018-03-14 HISTORY — DX: Spinal stenosis, site unspecified: M48.00

## 2018-03-14 HISTORY — DX: Headache: R51

## 2018-03-14 HISTORY — DX: Urinary tract infection, site not specified: N39.0

## 2018-03-14 HISTORY — DX: Unspecified osteoarthritis, unspecified site: M19.90

## 2018-03-14 HISTORY — DX: Personal history of other medical treatment: Z92.89

## 2018-03-14 HISTORY — DX: Headache, unspecified: R51.9

## 2018-03-14 HISTORY — DX: Cardiac murmur, unspecified: R01.1

## 2018-03-14 HISTORY — DX: Major depressive disorder, single episode, unspecified: F32.9

## 2018-03-14 HISTORY — DX: Personal history of urinary calculi: Z87.442

## 2018-03-14 LAB — COMPREHENSIVE METABOLIC PANEL
ALBUMIN: 3.5 g/dL (ref 3.5–5.0)
ALT: 19 U/L (ref 0–44)
ANION GAP: 9 (ref 5–15)
AST: 13 U/L — AB (ref 15–41)
Alkaline Phosphatase: 68 U/L (ref 38–126)
BUN: 13 mg/dL (ref 6–20)
CHLORIDE: 107 mmol/L (ref 98–111)
CO2: 26 mmol/L (ref 22–32)
Calcium: 8.5 mg/dL — ABNORMAL LOW (ref 8.9–10.3)
Creatinine, Ser: 0.76 mg/dL (ref 0.44–1.00)
GFR calc Af Amer: >60 mL/min (ref >60)
GFR calc non Af Amer: >60 mL/min (ref >60)
GLUCOSE: 105 mg/dL — AB (ref 70–99)
POTASSIUM: 3.3 mmol/L — AB (ref 3.5–5.1)
Sodium: 142 mmol/L (ref 135–145)
Total Bilirubin: 0.3 mg/dL (ref 0.3–1.2)
Total Protein: 6.3 g/dL — ABNORMAL LOW (ref 6.5–8.1)

## 2018-03-14 LAB — CBC
HCT: 39.9 % (ref 36.0–46.0)
HEMOGLOBIN: 12.2 g/dL (ref 12.0–15.0)
MCH: 29.1 pg (ref 26.0–34.0)
MCHC: 30.6 g/dL (ref 30.0–36.0)
MCV: 95.2 fL (ref 78.0–100.0)
Platelets: 193 10*3/uL (ref 150–400)
RBC: 4.19 MIL/uL (ref 3.87–5.11)
RDW: 14.4 % (ref 11.5–15.5)
WBC: 5.6 10*3/uL (ref 4.0–10.5)

## 2018-03-14 SURGERY — IRRIGATION AND DEBRIDEMENT EXTREMITY
Anesthesia: General | Laterality: Left

## 2018-03-14 MED ORDER — HYDROMORPHONE HCL 1 MG/ML IJ SOLN
INTRAMUSCULAR | Status: AC
  Filled 2018-03-14: qty 1

## 2018-03-14 MED ORDER — DOCUSATE SODIUM 100 MG PO CAPS
100.0000 mg | ORAL_CAPSULE | Freq: Two times a day (BID) | ORAL | Status: DC
  Administered 2018-03-14 – 2018-03-18 (×8): 100 mg via ORAL
  Filled 2018-03-14 (×8): qty 1

## 2018-03-14 MED ORDER — FENTANYL CITRATE (PF) 250 MCG/5ML IJ SOLN
INTRAMUSCULAR | Status: AC
  Filled 2018-03-14: qty 5

## 2018-03-14 MED ORDER — OXYCODONE HCL 5 MG PO TABS
5.0000 mg | ORAL_TABLET | ORAL | Status: DC | PRN
  Filled 2018-03-14: qty 2

## 2018-03-14 MED ORDER — BISACODYL 10 MG RE SUPP: 10.0000 mg | Freq: Every day | RECTAL | Status: DC | PRN

## 2018-03-14 MED ORDER — MIDAZOLAM HCL 5 MG/5ML IJ SOLN
INTRAMUSCULAR | Status: DC | PRN
  Administered 2018-03-14: 2 mg via INTRAVENOUS

## 2018-03-14 MED ORDER — MAGNESIUM CITRATE PO SOLN: 1.0000 | Freq: Once | ORAL | Status: DC | PRN

## 2018-03-14 MED ORDER — ONDANSETRON HCL 4 MG PO TABS: 4.0000 mg | ORAL_TABLET | Freq: Four times a day (QID) | ORAL | Status: DC | PRN

## 2018-03-14 MED ORDER — HYDROMORPHONE HCL 1 MG/ML IJ SOLN
0.2500 mg | INTRAMUSCULAR | Status: DC | PRN
  Administered 2018-03-14 (×2): 0.5 mg via INTRAVENOUS
  Administered 2018-03-14: 1 mg via INTRAVENOUS

## 2018-03-14 MED ORDER — SODIUM CHLORIDE 0.9 % IV SOLN
INTRAVENOUS | Status: DC
  Administered 2018-03-14 – 2018-03-15 (×2): via INTRAVENOUS

## 2018-03-14 MED ORDER — FENTANYL CITRATE (PF) 100 MCG/2ML IJ SOLN
INTRAMUSCULAR | Status: DC | PRN
  Administered 2018-03-14 (×2): 100 ug via INTRAVENOUS
  Administered 2018-03-14: 50 ug via INTRAVENOUS

## 2018-03-14 MED ORDER — ONDANSETRON HCL 4 MG/2ML IJ SOLN: 4.0000 mg | Freq: Four times a day (QID) | INTRAMUSCULAR | Status: DC | PRN

## 2018-03-14 MED ORDER — PROPOFOL 10 MG/ML IV BOLUS
INTRAVENOUS | Status: DC | PRN
  Administered 2018-03-14: 150 mg via INTRAVENOUS

## 2018-03-14 MED ORDER — ZOLPIDEM TARTRATE 5 MG PO TABS: 5.0000 mg | ORAL_TABLET | Freq: Every evening | ORAL | Status: DC | PRN

## 2018-03-14 MED ORDER — ACETAMINOPHEN 325 MG PO TABS
325.0000 mg | ORAL_TABLET | Freq: Four times a day (QID) | ORAL | Status: DC | PRN
  Administered 2018-03-16 – 2018-03-18 (×2): 650 mg via ORAL
  Filled 2018-03-14 (×3): qty 2

## 2018-03-14 MED ORDER — METHOCARBAMOL 500 MG PO TABS
500.0000 mg | ORAL_TABLET | Freq: Four times a day (QID) | ORAL | Status: DC | PRN
  Administered 2018-03-14 – 2018-03-18 (×9): 500 mg via ORAL
  Filled 2018-03-14 (×9): qty 1

## 2018-03-14 MED ORDER — CEFAZOLIN SODIUM-DEXTROSE 1-4 GM/50ML-% IV SOLN
1.0000 g | Freq: Three times a day (TID) | INTRAVENOUS | Status: DC
  Administered 2018-03-14 – 2018-03-17 (×9): 1 g via INTRAVENOUS
  Filled 2018-03-14 (×9): qty 50

## 2018-03-14 MED ORDER — ALPRAZOLAM 0.5 MG PO TABS
1.0000 mg | ORAL_TABLET | Freq: Two times a day (BID) | ORAL | Status: DC
  Administered 2018-03-14 – 2018-03-18 (×8): 1 mg via ORAL
  Filled 2018-03-14 (×8): qty 2

## 2018-03-14 MED ORDER — METOCLOPRAMIDE HCL 5 MG PO TABS: 5.0000 mg | ORAL_TABLET | Freq: Three times a day (TID) | ORAL | Status: DC | PRN

## 2018-03-14 MED ORDER — HYDROMORPHONE HCL 1 MG/ML IJ SOLN
1.0000 mg | INTRAMUSCULAR | Status: DC | PRN
  Administered 2018-03-14 – 2018-03-17 (×26): 1 mg via INTRAVENOUS
  Filled 2018-03-14 (×25): qty 1

## 2018-03-14 MED ORDER — PROPOFOL 10 MG/ML IV BOLUS
INTRAVENOUS | Status: AC
  Filled 2018-03-14: qty 20

## 2018-03-14 MED ORDER — LACTATED RINGERS IV SOLN
INTRAVENOUS | Status: DC
  Administered 2018-03-14 (×2): via INTRAVENOUS

## 2018-03-14 MED ORDER — GABAPENTIN 600 MG PO TABS
600.0000 mg | ORAL_TABLET | Freq: Three times a day (TID) | ORAL | Status: DC
  Administered 2018-03-14 – 2018-03-18 (×12): 600 mg via ORAL
  Filled 2018-03-14 (×12): qty 1

## 2018-03-14 MED ORDER — POLYETHYLENE GLYCOL 3350 17 G PO PACK: 17.0000 g | PACK | Freq: Every day | ORAL | Status: DC | PRN

## 2018-03-14 MED ORDER — OXYCODONE HCL 5 MG PO TABS
10.0000 mg | ORAL_TABLET | ORAL | Status: DC | PRN
  Administered 2018-03-14 – 2018-03-15 (×5): 15 mg via ORAL
  Administered 2018-03-16: 10 mg via ORAL
  Administered 2018-03-16 – 2018-03-18 (×7): 15 mg via ORAL
  Filled 2018-03-14 (×11): qty 3

## 2018-03-14 MED ORDER — QUETIAPINE FUMARATE 25 MG PO TABS
25.0000 mg | ORAL_TABLET | Freq: Every day | ORAL | Status: DC
  Administered 2018-03-14 – 2018-03-17 (×4): 25 mg via ORAL
  Filled 2018-03-14 (×5): qty 1

## 2018-03-14 MED ORDER — HYDROMORPHONE HCL 1 MG/ML IJ SOLN
INTRAMUSCULAR | Status: AC
  Filled 2018-03-14: qty 2

## 2018-03-14 MED ORDER — CEFAZOLIN SODIUM-DEXTROSE 2-4 GM/100ML-% IV SOLN
INTRAVENOUS | Status: AC
  Filled 2018-03-14: qty 100

## 2018-03-14 MED ORDER — METHOCARBAMOL 1000 MG/10ML IJ SOLN
500.0000 mg | Freq: Four times a day (QID) | INTRAVENOUS | Status: DC | PRN
  Filled 2018-03-14: qty 5

## 2018-03-14 MED ORDER — OXYCODONE HCL 5 MG PO TABS
ORAL_TABLET | ORAL | Status: AC
  Filled 2018-03-14: qty 3

## 2018-03-14 MED ORDER — MEPERIDINE HCL 50 MG/ML IJ SOLN: 6.2500 mg | INTRAMUSCULAR | Status: DC | PRN

## 2018-03-14 MED ORDER — CEFAZOLIN SODIUM-DEXTROSE 2-4 GM/100ML-% IV SOLN
2.0000 g | INTRAVENOUS | Status: AC
  Administered 2018-03-14: 2 g via INTRAVENOUS

## 2018-03-14 MED ORDER — NICOTINE 21 MG/24HR TD PT24
21.0000 mg | MEDICATED_PATCH | Freq: Every day | TRANSDERMAL | Status: DC
  Administered 2018-03-15 – 2018-03-18 (×4): 21 mg via TRANSDERMAL
  Filled 2018-03-14 (×5): qty 1

## 2018-03-14 MED ORDER — FLUOXETINE HCL 20 MG PO CAPS
60.0000 mg | ORAL_CAPSULE | Freq: Every day | ORAL | Status: DC
  Administered 2018-03-15 – 2018-03-18 (×4): 60 mg via ORAL
  Filled 2018-03-14 (×5): qty 3

## 2018-03-14 MED ORDER — TRAZODONE HCL 100 MG PO TABS
200.0000 mg | ORAL_TABLET | Freq: Every day | ORAL | Status: DC
  Administered 2018-03-14 – 2018-03-17 (×4): 200 mg via ORAL
  Filled 2018-03-14 (×4): qty 2

## 2018-03-14 MED ORDER — ONDANSETRON HCL 4 MG/2ML IJ SOLN
INTRAMUSCULAR | Status: DC | PRN
  Administered 2018-03-14: 4 mg via INTRAVENOUS

## 2018-03-14 MED ORDER — MIDAZOLAM HCL 2 MG/2ML IJ SOLN
INTRAMUSCULAR | Status: AC
  Filled 2018-03-14: qty 2

## 2018-03-14 MED ORDER — ASPIRIN EC 325 MG PO TBEC
325.0000 mg | DELAYED_RELEASE_TABLET | Freq: Every day | ORAL | Status: DC
Start: 2018-03-14 — End: 2018-03-18
  Administered 2018-03-14 – 2018-03-18 (×5): 325 mg via ORAL
  Filled 2018-03-14 (×6): qty 1

## 2018-03-14 MED ORDER — LIDOCAINE HCL (CARDIAC) PF 100 MG/5ML IV SOSY
PREFILLED_SYRINGE | INTRAVENOUS | Status: DC | PRN
  Administered 2018-03-14: 100 mg via INTRAVENOUS

## 2018-03-14 MED ORDER — ONDANSETRON HCL 4 MG/2ML IJ SOLN: 4.0000 mg | Freq: Once | INTRAMUSCULAR | Status: DC | PRN

## 2018-03-14 MED ORDER — CHLORHEXIDINE GLUCONATE 4 % EX LIQD: 60.0000 mL | Freq: Once | CUTANEOUS | Status: DC

## 2018-03-14 MED ORDER — METOCLOPRAMIDE HCL 5 MG/ML IJ SOLN: 5.0000 mg | Freq: Three times a day (TID) | INTRAMUSCULAR | Status: DC | PRN

## 2018-03-14 SURGICAL SUPPLY — 26 items
BNDG COHESIVE 4X5 TAN STRL (GAUZE/BANDAGES/DRESSINGS) ×2 IMPLANT
BNDG GAUZE ELAST 4 BULKY (GAUZE/BANDAGES/DRESSINGS) ×2 IMPLANT
COVER SURGICAL LIGHT HANDLE (MISCELLANEOUS) ×2 IMPLANT
DRAPE U-SHAPE 47X51 STRL (DRAPES) ×2 IMPLANT
DRSG ADAPTIC 3X8 NADH LF (GAUZE/BANDAGES/DRESSINGS) ×2 IMPLANT
DURAPREP 26ML APPLICATOR (WOUND CARE) ×2 IMPLANT
ELECT REM PT RETURN 9FT ADLT (ELECTROSURGICAL)
ELECTRODE REM PT RTRN 9FT ADLT (ELECTROSURGICAL) IMPLANT
GAUZE SPONGE 4X4 12PLY STRL (GAUZE/BANDAGES/DRESSINGS) ×2 IMPLANT
GAUZE SPONGE 4X4 12PLY STRL LF (GAUZE/BANDAGES/DRESSINGS) ×2 IMPLANT
GLOVE BIOGEL PI IND STRL 9 (GLOVE) ×1 IMPLANT
GLOVE BIOGEL PI INDICATOR 9 (GLOVE) ×1
GLOVE SURG ORTHO 9.0 STRL STRW (GLOVE) ×2 IMPLANT
GOWN STRL REUS W/ TWL XL LVL3 (GOWN DISPOSABLE) ×2 IMPLANT
GOWN STRL REUS W/TWL XL LVL3 (GOWN DISPOSABLE) ×2
KIT BASIN OR (CUSTOM PROCEDURE TRAY) ×2 IMPLANT
KIT TURNOVER KIT B (KITS) ×2 IMPLANT
MANIFOLD NEPTUNE II (INSTRUMENTS) ×2 IMPLANT
NS IRRIG 1000ML POUR BTL (IV SOLUTION) ×2 IMPLANT
PACK ORTHO EXTREMITY (CUSTOM PROCEDURE TRAY) ×2 IMPLANT
STOCKINETTE IMPERVIOUS 9X36 MD (GAUZE/BANDAGES/DRESSINGS) IMPLANT
SWAB COLLECTION DEVICE MRSA (MISCELLANEOUS) ×2 IMPLANT
SWAB CULTURE ESWAB REG 1ML (MISCELLANEOUS) IMPLANT
TOWEL OR 17X26 10 PK STRL BLUE (TOWEL DISPOSABLE) ×2 IMPLANT
TUBE CONNECTING 12X1/4 (SUCTIONS) ×2 IMPLANT
YANKAUER SUCT BULB TIP NO VENT (SUCTIONS) ×2 IMPLANT
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
## 2018-03-14 NOTE — Anesthesia Preprocedure Evaluation (Addendum)
Anesthesia Evaluation  Patient identified by MRN, date of birth, ID band Patient awake    Reviewed: Allergy & Precautions, NPO status , Patient's Chart, lab work & pertinent test results  Airway Mallampati: I  TM Distance: >3 FB Neck ROM: Full    Dental  (+) Dental Advisory Given   Pulmonary Current Smoker,    Pulmonary exam normal        Cardiovascular Normal cardiovascular exam     Neuro/Psych Anxiety Depression    GI/Hepatic   Endo/Other    Renal/GU      Musculoskeletal   Abdominal   Peds  Hematology   Anesthesia Other Findings   Reproductive/Obstetrics                            Anesthesia Physical Anesthesia Plan  ASA: II  Anesthesia Plan: General   Post-op Pain Management:    Induction: Intravenous  PONV Risk Score and Plan: 2 and Ondansetron and Treatment may vary due to age or medical condition  Airway Management Planned: LMA  Additional Equipment:   Intra-op Plan:   Post-operative Plan: Extubation in OR  Informed Consent: I have reviewed the patients History and Physical, chart, labs and discussed the procedure including the risks, benefits and alternatives for the proposed anesthesia with the patient or authorized representative who has indicated his/her understanding and acceptance.     Plan Discussed with: CRNA and Surgeon  Anesthesia Plan Comments:         Anesthesia Quick Evaluation

## 2018-03-14 NOTE — Plan of Care (Signed)

## 2018-03-14 NOTE — Anesthesia Procedure Notes (Signed)
Procedure Name: LMA Insertion Date/Time: 03/14/2018 1:32 PM Performed by: Zniya Cottone T, CRNA Pre-anesthesia Checklist: Patient identified, Emergency Drugs available, Suction available and Patient being monitored Patient Re-evaluated:Patient Re-evaluated prior to induction Oxygen Delivery Method: Circle system utilized Preoxygenation: Pre-oxygenation with 100% oxygen Induction Type: IV induction Ventilation: Mask ventilation without difficulty LMA: LMA inserted LMA Size: 4.0 Number of attempts: 1 Airway Equipment and Method: Patient positioned with wedge pillow Placement Confirmation: positive ETCO2 and breath sounds checked- equal and bilateral Tube secured with: Tape Dental Injury: Teeth and Oropharynx as per pre-operative assessment

## 2018-03-14 NOTE — Op Note (Signed)
03/14/2018  1:52 PM  PATIENT:  [name]    PRE-OPERATIVE DIAGNOSIS:  Abscess Left Foot  POST-OPERATIVE DIAGNOSIS:  Same  PROCEDURE:  IRRIGATION AND DEBRIDEMENT LEFT FOOT  SURGEON:  Nadara MustardMarcus V Walden Statz, MD  PHYSICIAN ASSISTANT:None ANESTHESIA:   General  PREOPERATIVE INDICATIONS:  Stacey Martinez is a  37 y.o. female with a diagnosis of Abscess Left Foot who failed conservative measures and elected for surgical management.    The risks benefits and alternatives were discussed with the patient preoperatively including but not limited to the risks of infection, bleeding, nerve injury, cardiopulmonary complications, the need for revision surgery, among others, and the patient was willing to proceed.  OPERATIVE IMPLANTS: None  @ENCIMAGES @  OPERATIVE FINDINGS: No purulent abscess there was fibrinous tissue.  Cultures obtained x2.  OPERATIVE PROCEDURE: Patient was brought the operating room underwent a general anesthetic.  After adequate levels of anesthesia were obtained patient's left lower extremity was prepped using DuraPrep draped into a sterile field a timeout was called.  A elliptical incision was made around the ulcerative tissue dorsal medial aspect of the left great toe.  The fibrinous tissue was excised sharply with a knife.  Wound was irrigated with normal saline.  There was no deep abscess.  After irrigation cleansing local tissue rearrangement was used to close the wound that was 1 x 5 cm.  Sterile dressing was applied patient was extubated taken to the PACU in stable condition.   DISCHARGE PLANNING:  Antibiotic duration: Continue IV antibiotics for 3 days  Weightbearing: Touchdown weightbearing on the left  Pain medication: High-dose opioid pathway ordered  Dressing care/ Wound VAC: Keep dressing clean dry and intact  Ambulatory devices: Walker or crutches  Discharge to: Anticipate discharge to home.  Follow-up: In the office 1 week post  operative.

## 2018-03-14 NOTE — Transfer of Care (Signed)
Immediate Anesthesia Transfer of Care Note  Patient: Stacey Martinez  Procedure(s) Performed: IRRIGATION AND DEBRIDEMENT LEFT FOOT (Left )  Patient Location: PACU  Anesthesia Type:General  Level of Consciousness: awake, alert  and oriented  Airway & Oxygen Therapy: Patient Spontanous Breathing and Patient connected to nasal cannula oxygen  Post-op Assessment: Report given to RN, Post -op Vital signs reviewed and stable and Patient moving all extremities  Post vital signs: Reviewed and stable  Last Vitals:  Vitals Value Taken Time  BP 108/73 03/14/2018  1:55 PM  Temp 36.4 C 03/14/2018  1:53 PM  Pulse 84 03/14/2018  1:59 PM  Resp 14 03/14/2018  1:59 PM  SpO2 94 % 03/14/2018  1:59 PM  Vitals shown include unvalidated device data.  Last Pain:  Vitals:   03/14/18 1228  TempSrc:   PainSc: 10-Worst pain ever      Patients Stated Pain Goal: 7 (03/14/18 1228)  Complications: No apparent anesthesia complications

## 2018-03-15 ENCOUNTER — Other Ambulatory Visit: Payer: Self-pay

## 2018-03-15 NOTE — Evaluation (Signed)
Physical Therapy Evaluation Patient Details Name: Stacey Martinez MRN: [id number] DOB: [birth year] Today's Date: 03/15/2018   History of Present Illness  Pt is a 37 y.o. F who has previously been treated for a wound on the dorsal lateral aspect of her right foot. Presents at this time wtih acute cellulitis and ulceration over the dorsum of the left great toe. S/p irrigation and debridement of left foot.  Clinical Impression  Pt admitted with above diagnosis. Pt currently with functional limitations due to the deficits listed below (see PT Problem List). Evaluation limited by pain. Patient able to transfer from bed to bedside commode using walker and supervision. Good maintenance of weightbearing precautions throughout. Displays good functional strength and range of motion of the left lower extremity. Will follow acutely to progress mobility and strengthening. Suspect patient will progress well once pain is well controlled.       Follow Up Recommendations No PT follow up;Supervision for mobility/OOB    Equipment Recommendations  None recommended by PT    Recommendations for Other Services       Precautions / Restrictions Precautions Precautions: Fall Required Braces or Orthoses: Other Brace/Splint Other Brace/Splint: left post op shoe Restrictions Weight Bearing Restrictions: Yes LLE Weight Bearing: Touchdown weight bearing      Mobility  Bed Mobility Overal bed mobility: Modified Independent                Transfers Overall transfer level: Needs assistance Equipment used: Rolling walker (2 wheeled);None Transfers: Sit to/from Stand Sit to Stand: Supervision         General transfer comment: Patient performed sit to stand from edge of bed with walker and from [facility] with hand support on BSC arms in order to perform peri care.  Ambulation/Gait Ambulation/Gait assistance: Supervision   Assistive device: Rolling walker (2 wheeled) Gait Pattern/deviations: Step-to  pattern     General Gait Details: Patient taking pivotal steps from bed to [facility] using walker and essentially keeping the left leg in non weightbearing, elevated off the floor.   Stairs            Wheelchair Mobility    Modified Rankin (Stroke Patients Only)       Balance Overall balance assessment: Mild deficits observed, not formally tested                                           Pertinent Vitals/Pain Pain Assessment: 0-10 Pain Score: 10-Worst pain ever Pain Location: left foot Pain Descriptors / Indicators: Grimacing;Moaning;Crying;Guarding;Constant Pain Intervention(s): Limited activity within patient's tolerance;Monitored during session;Premedicated before session    Home Living Family/patient expects to be discharged to:: Private residence Living Arrangements: Parent;Children;Non-relatives/Friends Available Help at Discharge: Available 24 hours/day Type of Home: House Home Access: Level entry     Home Layout: One level Home Equipment: Tub bench;Shower seat;Wheelchair - [employer] - 2 wheels      Prior Function Level of Independence: Needs assistance   Gait / Transfers Assistance Needed: uses walker mainly but also uses wheelchair for mobility           Hand Dominance   Dominant Hand: Right    Extremity/Trunk Assessment   Upper Extremity Assessment Upper Extremity Assessment: Overall WFL for tasks assessed    Lower Extremity Assessment Lower Extremity Assessment: LLE deficits/detail LLE Deficits / Details: At least anti gravity (3/5) but not formally assessed due to pain  and weightbearing status    Cervical / Trunk Assessment Cervical / Trunk Assessment: Normal  Communication   Communication: No difficulties  Cognition Arousal/Alertness: Suspect due to medications Behavior During Therapy: WFL for tasks assessed/performed Overall Cognitive Status: Within Functional Limits for tasks assessed                                  General Comments: Patient with eyes closed for most of the session and moaning in pain. Answers questions appropriately and follows multi step commands.      General Comments General comments (skin integrity, edema, etc.): wound across right lateral border of foot    Exercises     Assessment/Plan    PT Assessment Patient needs continued PT services  PT Problem List Decreased strength;Decreased activity tolerance;Decreased balance;Decreased mobility;Pain       PT Treatment Interventions DME instruction;Gait training;Functional mobility training;Therapeutic activities;Therapeutic exercise;Balance training;Patient/family education;Wheelchair mobility training    PT Goals (Current goals can be found in the Care Plan section)  Acute Rehab PT Goals Patient Stated Goal: decrease pain PT Goal Formulation: With patient Time For Goal Achievement: 03/29/18 Potential to Achieve Goals: Good    Frequency Min 5X/week   Barriers to discharge        Co-evaluation               AM-PAC PT "6 Clicks" Daily Activity  Outcome Measure Difficulty turning over in bed (including adjusting bedclothes, sheets and blankets)?: None Difficulty moving from lying on back to sitting on the side of the bed? : None Difficulty sitting down on and standing up from a chair with arms (e.g., wheelchair, bedside commode, etc,.)?: A Little Help needed moving to and from a bed to chair (including a wheelchair)?: A Little Help needed walking in hospital room?: A Little Help needed climbing 3-5 steps with a railing? : A Little 6 Click Score: 20    End of Session   Activity Tolerance: Patient limited by pain Patient left: in bed;with call bell/phone within reach;with SCD's reapplied Nurse Communication: Patient requests pain meds PT Visit Diagnosis: Pain;Difficulty in walking, not elsewhere classified (R26.2) Pain - Right/Left: Left Pain - part of body: Ankle and joints of foot     Time: [id number] PT Time Calculation (min) (ACUTE ONLY): 23 min   Charges:   PT Evaluation $PT Eval Low Complexity: 1 Low PT Treatments $Therapeutic Activity: 8-22 mins       Laurina Bustlearoline Mende [place], PT, DPT Acute Rehabilitation Services  Pager: [phone]   Vanetta MuldersCarloine H Treasure Ochs 03/15/2018, 11:09 AM

## 2018-03-15 NOTE — Progress Notes (Signed)
Patient ID: [name], female   DOB: 10/19/1980, 37 y.o.   MRN: [id number] Postoperative day 1 irrigation and debridement abscess left great toe.  Patient is resting comfortably the dressing is clean and dry.  Cultures are pending continue IV Kefzol.

## 2018-03-15 NOTE — Progress Notes (Signed)
Orthopedic Tech Progress Note Patient Details:  Stacey SilvasStacy D Hossain 03/25/1981 [id number]  Ortho Devices Type of Ortho Device: Prafo boot/shoe Ortho Device/Splint Location: delivered to room Ortho Device/Splint Interventions: Rich BraveOrdered       Mercer Peifer J 03/15/2018, 5:55 AM

## 2018-03-15 NOTE — Plan of Care (Signed)
  Problem: Coping: Goal: Level of anxiety will decrease Outcome: Progressing   Problem: Pain Managment: Goal: General experience of comfort will improve Outcome: Progressing   Problem: Safety: Goal: Ability to remain free from injury will improve Outcome: Progressing   

## 2018-03-15 NOTE — Anesthesia Postprocedure Evaluation (Signed)
Anesthesia Post Note  Patient: Stacey Martinez  Procedure(s) Performed: IRRIGATION AND DEBRIDEMENT LEFT FOOT (Left )     Patient location during evaluation: PACU Anesthesia Type: General Level of consciousness: awake and alert Pain management: pain level controlled Vital Signs Assessment: post-procedure vital signs reviewed and stable Respiratory status: spontaneous breathing, nonlabored ventilation, respiratory function stable and patient connected to nasal cannula oxygen Cardiovascular status: blood pressure returned to baseline and stable Postop Assessment: no apparent nausea or vomiting Anesthetic complications: no    Last Vitals:  Vitals:   03/15/18 0417 03/15/18 1954  BP: (!) 145/89 (!) 157/89  Pulse: 75 68  Resp:  16  Temp: 36.6 C 36.7 C  SpO2: 99% 100%    Last Pain:  Vitals:   03/15/18 2222  TempSrc:   PainSc: 10-Worst pain ever                 Jenice Leiner DAVID

## 2018-03-16 ENCOUNTER — Encounter (HOSPITAL_COMMUNITY): Payer: Self-pay | Admitting: Orthopedic Surgery

## 2018-03-16 NOTE — Plan of Care (Signed)
  Problem: Education: Goal: Knowledge of General Education information will improve Description: Including pain rating scale, medication(s)/side effects and non-pharmacologic comfort measures Outcome: Progressing   Problem: Clinical Measurements: Goal: Will remain free from infection Outcome: Progressing   Problem: Pain Managment: Goal: General experience of comfort will improve Outcome: Progressing   

## 2018-03-16 NOTE — Progress Notes (Signed)
Patient ID: Stacey SilvasStacy D Martinez, female   DOB: 07/17/1981, 37 y.o.   MRN: [id number] Postoperative day 2 irrigation debridement abscess left great toe.  Patient is resting she states her pain is moderate the dressing is dry cultures are negative anticipate discharge on Monday.

## 2018-03-17 MED ORDER — DOXYCYCLINE HYCLATE 100 MG PO TABS
100.0000 mg | ORAL_TABLET | Freq: Two times a day (BID) | ORAL | Status: DC
  Filled 2018-03-17: qty 1

## 2018-03-17 MED ORDER — SULFAMETHOXAZOLE-TRIMETHOPRIM 800-160 MG PO TABS
1.0000 | ORAL_TABLET | Freq: Two times a day (BID) | ORAL | Status: DC
  Administered 2018-03-17 – 2018-03-18 (×2): 1 via ORAL
  Filled 2018-03-17 (×2): qty 1

## 2018-03-17 NOTE — Progress Notes (Signed)
Physical Therapy Treatment Patient Details Name: Stacey Martinez MRN: [id number] DOB: 17-Aug-1980 Today's Date: 03/17/2018    History of Present Illness Pt is a 37 y.o. F who has previously been treated for a wound on the dorsal lateral aspect of her right foot. Presents at this time wtih acute cellulitis and ulceration over the dorsum of the left great toe. S/p irrigation and debridement of left foot.    PT Comments    Pt performed gait training and functional mobility during session.  Pt able to ambulate to and from the bathroom and maintain TDWB well.  Pt is slow and guarded but continues to progress toward goals to return home at d/c.  Reports she is returning home with her boyfriend and has elevator access.  Will continue PT per POC next session.      Follow Up Recommendations  No PT follow up;Supervision for mobility/OOB     Equipment Recommendations  None recommended by PT    Recommendations for Other Services       Precautions / Restrictions Precautions Precautions: Fall Required Braces or Orthoses: Other Brace/Splint Other Brace/Splint: left post op shoe Restrictions Weight Bearing Restrictions: Yes LLE Weight Bearing: Touchdown weight bearing    Mobility  Bed Mobility Overal bed mobility: Modified Independent                Transfers Overall transfer level: Needs assistance Equipment used: Rolling walker (2 wheeled);None Transfers: Sit to/from Stand Sit to Stand: Supervision         General transfer comment: Pt required cues for hand placement to and from seated surface.  Pt slow and guarded due to pain.  Step by step instruction for hand placement in bathroom.    Ambulation/Gait Ambulation/Gait assistance: Supervision Gait Distance (Feet): 10 Feet(x2) Assistive device: Rolling walker (2 wheeled) Gait Pattern/deviations: Step-to pattern;Decreased stride length;Antalgic     General Gait Details: Cues for step to pattern to and from commode.      Stairs             Wheelchair Mobility    Modified Rankin (Stroke Patients Only)       Balance Overall balance assessment: Mild deficits observed, not formally tested                                          Cognition Arousal/Alertness: Suspect due to medications;Lethargic Behavior During Therapy: Flat affect Overall Cognitive Status: Within Functional Limits for tasks assessed                                        Exercises      General Comments        Pertinent Vitals/Pain Pain Assessment: 0-10 Pain Score: 6  Pain Location: left foot Pain Descriptors / Indicators: Grimacing;Moaning;Crying;Guarding;Constant Pain Intervention(s): Monitored during session;Repositioned;Ice applied    Home Living                      Prior Function            PT Goals (current goals can now be found in the care plan section) Acute Rehab PT Goals Patient Stated Goal: decrease pain Potential to Achieve Goals: Good Progress towards PT goals: Progressing toward goals    Frequency    Min 5X/week  PT Plan Current plan remains appropriate    Co-evaluation              AM-PAC PT "6 Clicks" Daily Activity  Outcome Measure  Difficulty turning over in bed (including adjusting bedclothes, sheets and blankets)?: None Difficulty moving from lying on back to sitting on the side of the bed? : None Difficulty sitting down on and standing up from a chair with arms (e.g., wheelchair, bedside commode, etc,.)?: A Little Help needed moving to and from a bed to chair (including a wheelchair)?: A Little Help needed walking in hospital room?: A Little Help needed climbing 3-5 steps with a railing? : A Little 6 Click Score: 20    End of Session Equipment Utilized During Treatment: Gait belt Activity Tolerance: Patient limited by pain Patient left: with call bell/phone within reach;with SCD's reapplied;in chair;with chair  alarm set Nurse Communication: Patient requests pain meds PT Visit Diagnosis: Pain;Difficulty in walking, not elsewhere classified (R26.2) Pain - Right/Left: Left Pain - part of body: Ankle and joints of foot     Time: [id number] PT Time Calculation (min) (ACUTE ONLY): 28 min  Charges:  $Gait Training: 8-22 mins $Therapeutic Activity: 8-22 mins                     Joycelyn RuaAimee Irfan Veal, PTA pager [phone]    Florestine AversAimee J Sandee Bernath 03/17/2018, 5:05 PM

## 2018-03-17 NOTE — Progress Notes (Signed)
Patient ID: Stacey Martinez, female   DOB: 09/01/1980, 37 y.o.   MRN: [id number] Postoperative day 3 irrigation and debridement abscess left great toe.  Cultures are now positive for staph aureus.  Will await culture sensitivities and discharged to home on Tuesday.  Patient is resting comfortably this morning dressing is clean and dry.

## 2018-03-17 NOTE — Progress Notes (Signed)
Pharmacy Antibiotic Note  Stacey Martinez is a 37 y.o. female admitted on 03/14/2018 with osteo.  Pharmacy has been consulted for doxycycline dosing.  Stacey Martinez is s/p I&D of her foot. 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 D/w with Dr. Lajoyce Cornersuda and he would like to treat with doxy x 30d. However, she has an iffy reaction to doxy. After talking to her, she said that the reaction was only in one foot. Dr. Lajoyce Cornersuda would like to try doxy again because this is likely not a true allergy.   Plan: Dc Ancef Start doxy 100mg  BID x 30d  Height: 5\' 4"  (162.6 cm) Weight: 160 lb (72.6 kg) IBW/kg (Calculated) : 54.7  Temp (24hrs), Avg:98.2 F (36.8 C), Min:98.1 F (36.7 C), Max:98.2 F (36.8 C)  Recent Labs  Lab 03/14/18 1258  WBC 5.6  CREATININE 0.76    Estimated Creatinine Clearance: 93.2 mL/min (by C-G formula based on SCr of 0.76 mg/dL).    Allergies  Allergen Reactions  . Phenytoin Sodium Extended Other (See Comments)    Seizure Fd&c Yellow #6-na Benzoate-phenytoin  . Yellow Dye Other (See Comments)    Seizures Fd&c Yellow #6-na Benzoate-phenytoin  . Doxycycline Rash    blisters Fixed drug eruption December 2018    Antimicrobials this admission:   Dose adjustments this admission:   Microbiology results: BCx:  UCx:  Sputum:  MRSA PCR: Wound>>MRSA  Ulyses SouthwardMinh Pham, PharmD, Suzan NailerBCIDP, AAHIVP, CPP Infectious Disease Pharmacist Pager: [phone] 03/17/2018 2:21 PM

## 2018-03-17 NOTE — Plan of Care (Signed)

## 2018-03-18 MED ORDER — OXYCODONE-ACETAMINOPHEN 5-325 MG PO TABS: 1.0000 | ORAL_TABLET | ORAL | 0 refills | Status: AC | PRN

## 2018-03-18 MED ORDER — CIPROFLOXACIN HCL 500 MG PO TABS: 500.0000 mg | ORAL_TABLET | Freq: Two times a day (BID) | ORAL | 0 refills | Status: DC

## 2018-03-18 NOTE — Discharge Summary (Signed)
Discharge Diagnoses:  Active Problems:   Cellulitis and abscess of toe of left foot   Abscess of left foot   Surgeries: Procedure(s): IRRIGATION AND DEBRIDEMENT LEFT FOOT on 03/14/2018    Consultants:   Discharged Condition: Improved  Hospital Course: Stacey Martinez is an 37 y.o. female who was admitted 03/14/2018 with a chief complaint of abscess left foot, with a final diagnosis of Abscess Left Foot.  Patient was brought to the operating room on 03/14/2018 and underwent Procedure(s): IRRIGATION AND DEBRIDEMENT LEFT FOOT.    Patient was given perioperative antibiotics:  Anti-infectives (From admission, onward)   Start     Dose/Rate Route Frequency Ordered Stop   03/18/18 0000  ciprofloxacin (CIPRO) 500 MG tablet     500 mg Oral 2 times daily 03/18/18 0628     03/17/18 2330  sulfamethoxazole-trimethoprim (BACTRIM DS,SEPTRA DS) 800-160 MG per tablet 1 tablet     1 tablet Oral Every 12 hours 03/17/18 2234     03/17/18 1500  doxycycline (VIBRA-TABS) tablet 100 mg     100 mg Oral Every 12 hours 03/17/18 1417 04/16/18 0959   03/15/18 0600  ceFAZolin (ANCEF) IVPB 2g/100 mL premix     2 g 200 mL/hr over 30 Minutes Intravenous On call to O.R. 03/14/18 1207 03/14/18 1326   03/14/18 2200  ceFAZolin (ANCEF) IVPB 1 g/50 mL premix  Status:  Discontinued     1 g 100 mL/hr over 30 Minutes Intravenous Every 8 hours 03/14/18 1508 03/17/18 1417   03/14/18 1211  ceFAZolin (ANCEF) 2-4 GM/100ML-% IVPB    Note to Pharmacy:  Kathrene Bongo   : cabinet override      03/14/18 1211 03/14/18 1326    .  Patient was given sequential compression devices, early ambulation, and aspirin for DVT prophylaxis.  Recent vital signs:  Patient Vitals for the past 24 hrs:  BP Temp Temp src Pulse Resp SpO2  03/18/18 0504 125/83 97.8 F (36.6 C) Oral 67 17 95 %  03/17/18 2049 (!) 143/88 98.4 F (36.9 C) Oral (!) 51 17 97 %  03/17/18 1404 93/72 98.2 F (36.8 C) Oral 72 14 93 %  .  Recent laboratory studies: No  results found.  Discharge Medications:   Allergies as of 03/18/2018      Reactions   Phenytoin Sodium Extended Other (See Comments)   Seizure Fd&c Yellow #6-na Benzoate-phenytoin   Yellow Dye Other (See Comments)   Seizures Fd&c Yellow #6-na Benzoate-phenytoin   Doxycycline Rash   blisters Fixed drug eruption December 2018      Medication List    STOP taking these medications   cephALEXin 500 MG capsule Commonly known as:  KEFLEX     TAKE these medications   ALPRAZolam 1 MG tablet Commonly known as:  XANAX Take 1 mg by mouth 2 (two) times daily.   ciprofloxacin 500 MG tablet Commonly known as:  CIPRO Take 1 tablet (500 mg total) by mouth 2 (two) times daily.   DITROPAN XL 10 MG 24 hr tablet Generic drug:  oxybutynin Take 10 mg by mouth daily as needed.   FLUoxetine HCl 60 MG Tabs Take 60 mg by mouth daily.   gabapentin 600 MG tablet Commonly known as:  NEURONTIN Take 1 tablet (600 mg total) by mouth 4 (four) times daily. What changed:  when to take this   ibuprofen 800 MG tablet Commonly known as:  ADVIL,MOTRIN Take 1 tablet (800 mg total) by mouth every 8 (eight) hours as needed.  methocarbamol 500 MG tablet Commonly known as:  ROBAXIN Take 1,500-2,000 mg by mouth 4 (four) times daily.   nicotine 21 mg/24hr patch Commonly known as:  NICODERM CQ - dosed in mg/24 hours Place 21 mg onto the skin daily.   Oxycodone HCl 10 MG Tabs Take 10 mg by mouth every 4 (four) hours.   oxyCODONE-acetaminophen 5-325 MG tablet Commonly known as:  PERCOCET/ROXICET Take 1 tablet by mouth every 4 (four) hours as needed. What changed:    when to take this  Another medication with the same name was removed. Continue taking this medication, and follow the directions you see here.   QUEtiapine 25 MG tablet Commonly known as:  SEROQUEL Take 1 tablet (25 mg total) by mouth at bedtime. What changed:  how much to take   traZODone 100 MG tablet Commonly known as:   DESYREL Take 200 mg by mouth at bedtime.   triamcinolone 0.025 % cream Commonly known as:  KENALOG Apply 1 application topically daily.       Diagnostic Studies: Xr Foot Complete Left  Result Date: 03/12/2018 3 view radiographs of the left foot shows no evidence of a foreign body there is swelling there is no subcutaneous air.   Patient benefited maximally from their hospital stay and there were no complications.     Disposition: Discharge disposition: 01-Home or Self Care      Discharge Instructions    Call MD / Call 911   Complete by:  As directed    If you experience chest pain or shortness of breath, CALL 911 and be transported to the hospital emergency room.  If you develope a fever above 101 F, pus (white drainage) or increased drainage or redness at the wound, or calf pain, call your surgeon's office.   Constipation Prevention   Complete by:  As directed    Drink plenty of fluids.  Prune juice may be helpful.  You may use a stool softener, such as Colace (over the counter) 100 mg twice a day.  Use MiraLax (over the counter) for constipation as needed.   Diet - low sodium heart healthy   Complete by:  As directed    Increase activity slowly as tolerated   Complete by:  As directed      Follow-up Information    Nadara Mustarduda, Williams Dietrick V, MD Follow up in 2 week(s).   Specialty:  Orthopedic Surgery Contact information: [address] [place] [address] [phone]            Signed: Nadara MustardMarcus V [place] Dunlevy 03/18/2018, 6:28 AM

## 2018-03-18 NOTE — Progress Notes (Signed)
Physical Therapy Treatment Patient Details Name: Stacey Martinez MRN: [id number] DOB: [birth year] Today's Date: 03/18/2018    History of Present Illness Pt is a 37 y.o. F who has previously been treated for a wound on the dorsal lateral aspect of her right foot. Presents at this time wtih acute cellulitis and ulceration over the dorsum of the left great toe. S/p irrigation and debridement of left foot.    PT Comments    Pt progressing toward PT goals; she is overall mod I to supervision with mobility; self limits gait distance and is able to maintain TDWB with occasional cues; she has w/c and RW at home; discussed using w/c for longer distances; appears ready for d/c today from PT standpoint although pt  States she does not want to go;  Follow Up Recommendations  No PT follow up;Supervision for mobility/OOB     Equipment Recommendations  None recommended by PT    Recommendations for Other Services       Precautions / Restrictions Precautions Precautions: Fall Precaution Comments: pt declines to wear post op shoe  Required Braces or Orthoses: Other Brace/Splint Other Brace/Splint: left post op shoe Restrictions LLE Weight Bearing: Touchdown weight bearing Other Position/Activity Restrictions: educated on elevation for edema control LLE    Mobility  Bed Mobility Overal bed mobility: Modified Independent                Transfers Overall transfer level: Needs assistance Equipment used: Rolling walker (2 wheeled);None Transfers: Sit to/from Stand Sit to Stand: Supervision;Modified independent (Device/Increase time)         General transfer comment: initial cues for hand placement, pt self corrects end of session  Ambulation/Gait Ambulation/Gait assistance: Supervision Gait Distance (Feet): 15 Feet(x2 in room adn to bathroom, refuses further) Assistive device: Rolling walker (2 wheeled) Gait Pattern/deviations: Step-to pattern;Decreased stride length     General  Gait Details: cues for TDWB and position of RW   Stairs             Wheelchair Mobility    Modified Rankin (Stroke Patients Only)       Balance                                            Cognition Arousal/Alertness: Awake/alert Behavior During Therapy: Flat affect Overall Cognitive Status: Within Functional Limits for tasks assessed                                        Exercises General Exercises - Lower Extremity Ankle Circles/Pumps: AROM;Both;5 reps;Limitations Ankle Circles/Pumps Limitations: encouraged, limited on L d/t pain    General Comments        Pertinent Vitals/Pain Pain Assessment: 0-10 Pain Score: 5  Pain Location: left foot Pain Descriptors / Indicators: Grimacing;Constant Pain Intervention(s): Monitored during session    Home Living                      Prior Function            PT Goals (current goals can now be found in the care plan section) Acute Rehab PT Goals Patient Stated Goal: decrease pain PT Goal Formulation: With patient Time For Goal Achievement: 03/29/18 Potential to Achieve Goals: Good Progress towards PT goals: Progressing toward goals  Frequency    Min 5X/week      PT Plan Current plan remains appropriate    Co-evaluation              AM-PAC PT "6 Clicks" Daily Activity  Outcome Measure  Difficulty turning over in bed (including adjusting bedclothes, sheets and blankets)?: None Difficulty moving from lying on back to sitting on the side of the bed? : None Difficulty sitting down on and standing up from a chair with arms (e.g., wheelchair, bedside commode, etc,.)?: A Little Help needed moving to and from a bed to chair (including a wheelchair)?: A Little Help needed walking in hospital room?: A Little Help needed climbing 3-5 steps with a railing? : A Little 6 Click Score: 20    End of Session   Activity Tolerance: Patient tolerated treatment  well Patient left: in bed;with call bell/phone within reach   PT Visit Diagnosis: Pain;Difficulty in walking, not elsewhere classified (R26.2) Pain - Right/Left: Left Pain - part of body: Ankle and joints of foot     Time: [id number] PT Time Calculation (min) (ACUTE ONLY): 18 min  Charges:  $Gait Training: 8-22 mins                     Drucilla Chaletara Velva Molinari, PT Pager: [phone] 03/18/2018    [facility] LLCWILLIAMS,Samiksha Pellicano 03/18/2018, 10:56 AM

## 2018-03-18 NOTE — Progress Notes (Addendum)
Pt discharged to home in stable condition.  All discharge instructions and Rx's x 2 given to pt.  Pt transported via wheelchair with transporters x 1 at chairside.  AKingBSNRN

## 2018-03-18 NOTE — Progress Notes (Signed)
Patient ID: [name], female   DOB: 04/06/1981, 37 y.o.   MRN: [id number] Cultures finalized positive for MRSA.  Patient spoke with pharmacy yesterday afternoon stating that her reaction to doxycycline was only one foot.  She agreed to proceed with doxycycline however I was called late last night with patient refusing to take doxycycline.  Will discharge patient on Cipro.

## 2018-03-19 LAB — AEROBIC/ANAEROBIC CULTURE W GRAM STAIN (SURGICAL/DEEP WOUND)

## 2018-03-19 LAB — AEROBIC/ANAEROBIC CULTURE (SURGICAL/DEEP WOUND)

## 2018-03-20 ENCOUNTER — Telehealth (INDEPENDENT_AMBULATORY_CARE_PROVIDER_SITE_OTHER): Payer: Self-pay | Admitting: Orthopedic Surgery

## 2018-03-20 NOTE — Telephone Encounter (Signed)
I called and [facility] to advise that they should shred the rx for pain medication. Called pt and advised that this will not be filled.pt will come in and see Dr. Lajoyce Cornersuda Monday. Advised to keep foot elevated higher than her heart. She states that she has been "kicked out" of her boyfriends appt and that she has been staying place to place and she is not able to stay off of both of her feet. Advised for her to stay off of the left and use the right minimally to take care of her basic needs.

## 2018-03-20 NOTE — Telephone Encounter (Signed)
Pharmacist called because patient is receiving oxycodone from another provider and it would be too high of a dose for patient to take. Please call pharmacy back # [phone]

## 2018-03-24 ENCOUNTER — Ambulatory Visit (INDEPENDENT_AMBULATORY_CARE_PROVIDER_SITE_OTHER): Payer: Medicaid Other | Admitting: Orthopedic Surgery

## 2018-04-02 ENCOUNTER — Inpatient Hospital Stay (INDEPENDENT_AMBULATORY_CARE_PROVIDER_SITE_OTHER): Payer: Medicaid Other | Admitting: Family

## 2018-04-10 ENCOUNTER — Inpatient Hospital Stay (INDEPENDENT_AMBULATORY_CARE_PROVIDER_SITE_OTHER): Payer: Medicaid Other | Admitting: Physician Assistant

## 2018-04-11 ENCOUNTER — Encounter (INDEPENDENT_AMBULATORY_CARE_PROVIDER_SITE_OTHER): Payer: Self-pay | Admitting: Physician Assistant

## 2018-04-11 ENCOUNTER — Ambulatory Visit (INDEPENDENT_AMBULATORY_CARE_PROVIDER_SITE_OTHER): Payer: Medicaid Other | Admitting: Physician Assistant

## 2018-04-11 VITALS — Ht 64.0 in | Wt 160.0 lb

## 2018-04-11 DIAGNOSIS — S91301D Unspecified open wound, right foot, subsequent encounter: Secondary | ICD-10-CM

## 2018-04-11 DIAGNOSIS — L03032 Cellulitis of left toe: Secondary | ICD-10-CM

## 2018-04-11 DIAGNOSIS — L02612 Cutaneous abscess of left foot: Secondary | ICD-10-CM

## 2018-04-11 MED ORDER — CIPROFLOXACIN HCL 500 MG PO TABS: 500.0000 mg | ORAL_TABLET | Freq: Two times a day (BID) | ORAL | 0 refills | Status: DC

## 2018-04-11 MED ORDER — CIPROFLOXACIN HCL 500 MG PO TABS: 500.0000 mg | ORAL_TABLET | Freq: Two times a day (BID) | ORAL | 0 refills | Status: AC

## 2018-04-11 NOTE — Progress Notes (Signed)
Office Visit Note   Patient: ELECTRA PALADINO           Date of Birth: [birth year]           MRN: [id number] Visit Date: 04/11/2018              Requested by: Arlie Solomons, MD [address] [place], [place] [address] PCP: Arlie Solomons, MD   Assessment & Plan: Visit Diagnoses: No diagnosis found.  Plan: Sutures were harvested from the left great toe incision.  We are going to continue Cipro 500 mg p.o. twice daily.  She is also going to use some mupirocin ointment to the incisional area of the left great toe daily after showering and also over the right lateral chronic ulceration once daily after showering.  She can cover these areas with 4 x 4 and then Ace wrap to control edema for at least 1 week then she can resume her silver socks.  She will follow-up in 2 weeks.  Follow-Up Instructions: Return in about 2 weeks (around 04/25/2018).   Orders:  No orders of the defined types were placed in this encounter.  Meds ordered this encounter  Medications  . DISCONTD: ciprofloxacin (CIPRO) 500 MG tablet    Sig: Take 1 tablet (500 mg total) by mouth 2 (two) times daily.    Dispense:  30 tablet    Refill:  0  . ciprofloxacin (CIPRO) 500 MG tablet    Sig: Take 1 tablet (500 mg total) by mouth 2 (two) times daily.    Dispense:  30 tablet    Refill:  0      Procedures: No procedures performed   Clinical Data: No additional findings.   Subjective: Chief Complaint  Patient presents with  . Left Foot - Follow-up  . Right Foot - Pain    HPI This is a 37 year old female who is seen for postoperative follow-up following irrigation and debridement of an abscess of her left foot on 03/14/2018.  Operative cultures showed MRSA which was sensitive to doxycycline however the patient was initially refusing to take doxycycline and she was discharged on Cipro.  She still has some edema and warmth over the left great toe and reports that her movement is limited in the great toe on the  left.  She has a residual wound over her right dorsal foot which is been there chronically.  Operative cultures did show MRSA as mentioned and we are going to start some mupirocin ointment to both of the right foot ulcer and the left great toe incisional area.  We will also continue her Cipro.      Review of Systems   Objective: Vital Signs: Ht 5\' 4"  (1.626 m)   Wt 160 lb (72.6 kg)   BMI 27.46 kg/m   Physical Exam Is a well-nourished well-developed female who presents at the wheelchair level.  She is alert oriented and appropriate. Ortho Exam Examination of the left great toe incisions so shows the sutures intact.  These were harvested this visit.  There is some very mild erythema over the area and some localized edema of both feet.  She has good pedal pulses.  The incision is intact but the sutures are quite varied but were able to be removed today and she has some mild serous drainage following this.  No signs of cellulitis. The right dorsal foot wound is approximately 4 x 2 by centimeters by 1 mm with slick pink tissue without evidence of active  infection. Specialty Comments:  No specialty comments available.  Imaging: No results found.   PMFS History: Patient Active Problem List   Diagnosis Date Noted  . Abscess of left foot 03/14/2018  . Cellulitis and abscess of toe of left foot   . Abscess of right foot   . Abscess of tendon sheath, right ankle and foot   . Cellulitis of right ankle   . Leukocytosis 11/19/2017  . IVDU (intravenous drug user) 11/19/2017  . Anxiety and depression 11/19/2017  . Cellulitis 11/18/2017   Past Medical History:  Diagnosis Date  . Anxiety    Pinic attacks  . Arthritis    Back  . Depression   . Frequent UTI   . Headache   . Heart murmur    "nothing to be concerned about."  . History of blood transfusion    " when I  lost a baby"  . History of kidney stones    78/08/2017- "in tge walls., I have blood in my urine at times."  . Hx of  degenerative disc disease   . Pelvic floor dysfunction in female   . Seizures (HCC)    "childhood"  . Spinal stenosis   . Substance abuse (HCC)     History reviewed. No pertinent family history.  Past Surgical History:  Procedure Laterality Date  . ABDOMINAL HYSTERECTOMY    . ADENOIDECTOMY    . COLONOSCOPY    . DILATION AND CURETTAGE OF UTERUS    . I&D EXTREMITY Right 11/22/2017   Procedure: RIGHT ANKLE DEBRIDEMENT AND PLACEMENT OF WOUND VAC  Drainege of Abcess left arm;  Surgeon: Nadara Mustarduda, Marcus V, MD;  [facility];  Service: Orthopedics;  Laterality: Right;  . I&D EXTREMITY Right 11/27/2017   Procedure: REPEAT DEBRIDEMENT OF THE RIGHT FOOT, WOUND CLOSURE;  Surgeon: Nadara Mustarduda, Marcus V, MD;  Location: [facility] OR;  Service: Orthopedics;  Laterality: Right;  . I&D EXTREMITY Left 03/14/2018   Procedure: IRRIGATION AND DEBRIDEMENT LEFT FOOT;  Surgeon: Nadara Mustarduda, Marcus V, MD;  [facility];  Service: Orthopedics;  Laterality: Left;  . INCISION AND DRAINAGE ABSCESS Right 11/22/2017   Procedure: INCISION AND DRAINAGE ABSCESS NECK ABCESS;  Surgeon: Nadara Mustarduda, Marcus V, MD;  Location: [facility] OR;  Service: Orthopedics;  Laterality: Right;  . IRRIGATION AND DEBRIDEMENT ABSCESS  11/22/2017   Procedure: IRRIGATION AND DEBRIDEMENT ABSCESS Neck;  Surgeon: Axel Filleramirez, Armando, MD;  [facility];  Service: General;;  . KNEE ARTHROSCOPY Left    ACL  . LAPAROSCOPY     endometritosis  . TEE WITHOUT CARDIOVERSION N/A 11/29/2017   Procedure: TRANSESOPHAGEAL ECHOCARDIOGRAM (TEE);  Surgeon: Pricilla Riffleoss, Paula V, MD;  [facility];  Service: Cardiovascular;  Laterality: N/A;  . TONSILLECTOMY    . TYMPANOSTOMY TUBE PLACEMENT     Social History   Occupational History  . Not on file  Tobacco Use  . Smoking status: Current Every Day Smoker    Types: Cigarettes  . Smokeless tobacco: Never Used  . Tobacco comment: "smoke Black and mild"  Substance and Sexual Activity  . Alcohol use: Not Currently  . Drug use: Not Currently    . Sexual activity: Not on file

## 2018-04-28 ENCOUNTER — Ambulatory Visit (INDEPENDENT_AMBULATORY_CARE_PROVIDER_SITE_OTHER): Payer: Medicaid Other | Admitting: Physician Assistant

## 2018-05-15 ENCOUNTER — Ambulatory Visit (INDEPENDENT_AMBULATORY_CARE_PROVIDER_SITE_OTHER): Payer: Medicaid Other | Admitting: Physician Assistant

## 2018-07-04 ENCOUNTER — Ambulatory Visit (INDEPENDENT_AMBULATORY_CARE_PROVIDER_SITE_OTHER): Payer: Medicaid Other | Admitting: Physician Assistant

## 2018-07-24 ENCOUNTER — Ambulatory Visit (INDEPENDENT_AMBULATORY_CARE_PROVIDER_SITE_OTHER): Payer: Medicaid Other | Admitting: Physician Assistant

## 2018-08-12 ENCOUNTER — Ambulatory Visit (INDEPENDENT_AMBULATORY_CARE_PROVIDER_SITE_OTHER): Payer: Medicaid Other | Admitting: Physician Assistant

## 2018-09-01 ENCOUNTER — Ambulatory Visit (INDEPENDENT_AMBULATORY_CARE_PROVIDER_SITE_OTHER): Payer: Medicaid Other | Admitting: Physician Assistant

## 2020-04-08 IMAGING — DX DG ANKLE COMPLETE 3+V*L*
3 series · 3 of 3 positions shown · non-contrast
Comparison: None.

CLINICAL DATA: Left ankle infection. Redness and swelling medially.

EXAM:
LEFT ANKLE COMPLETE - 3+ VIEW

[ankle ap]
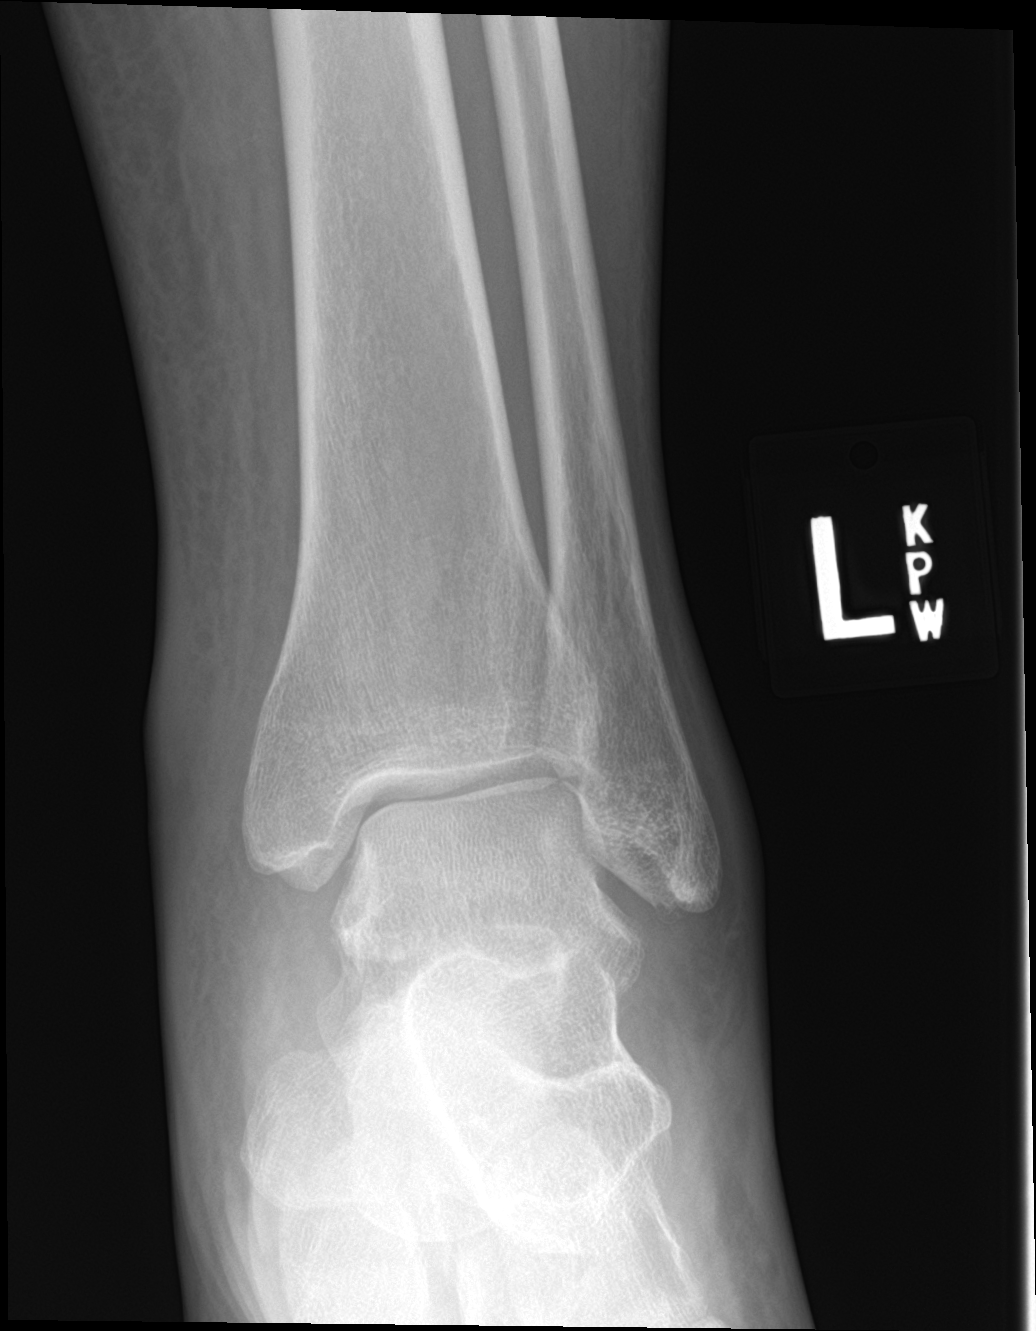

[ankle obl]
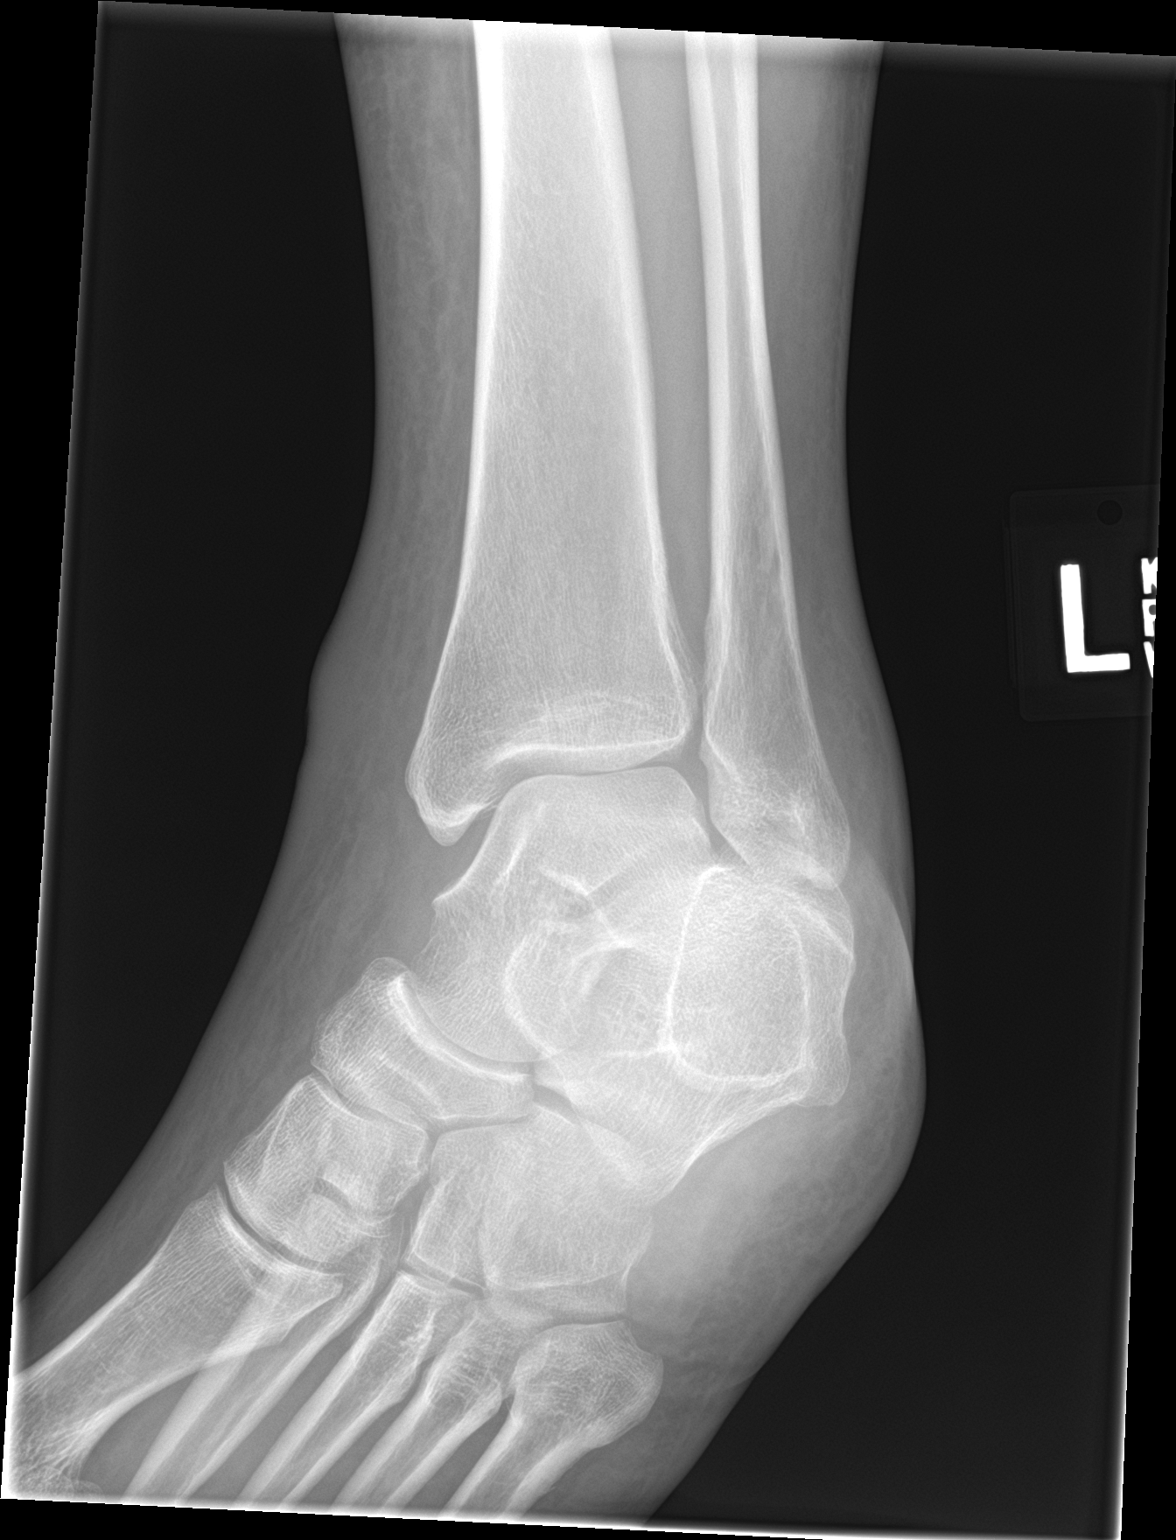

[ankle lat]
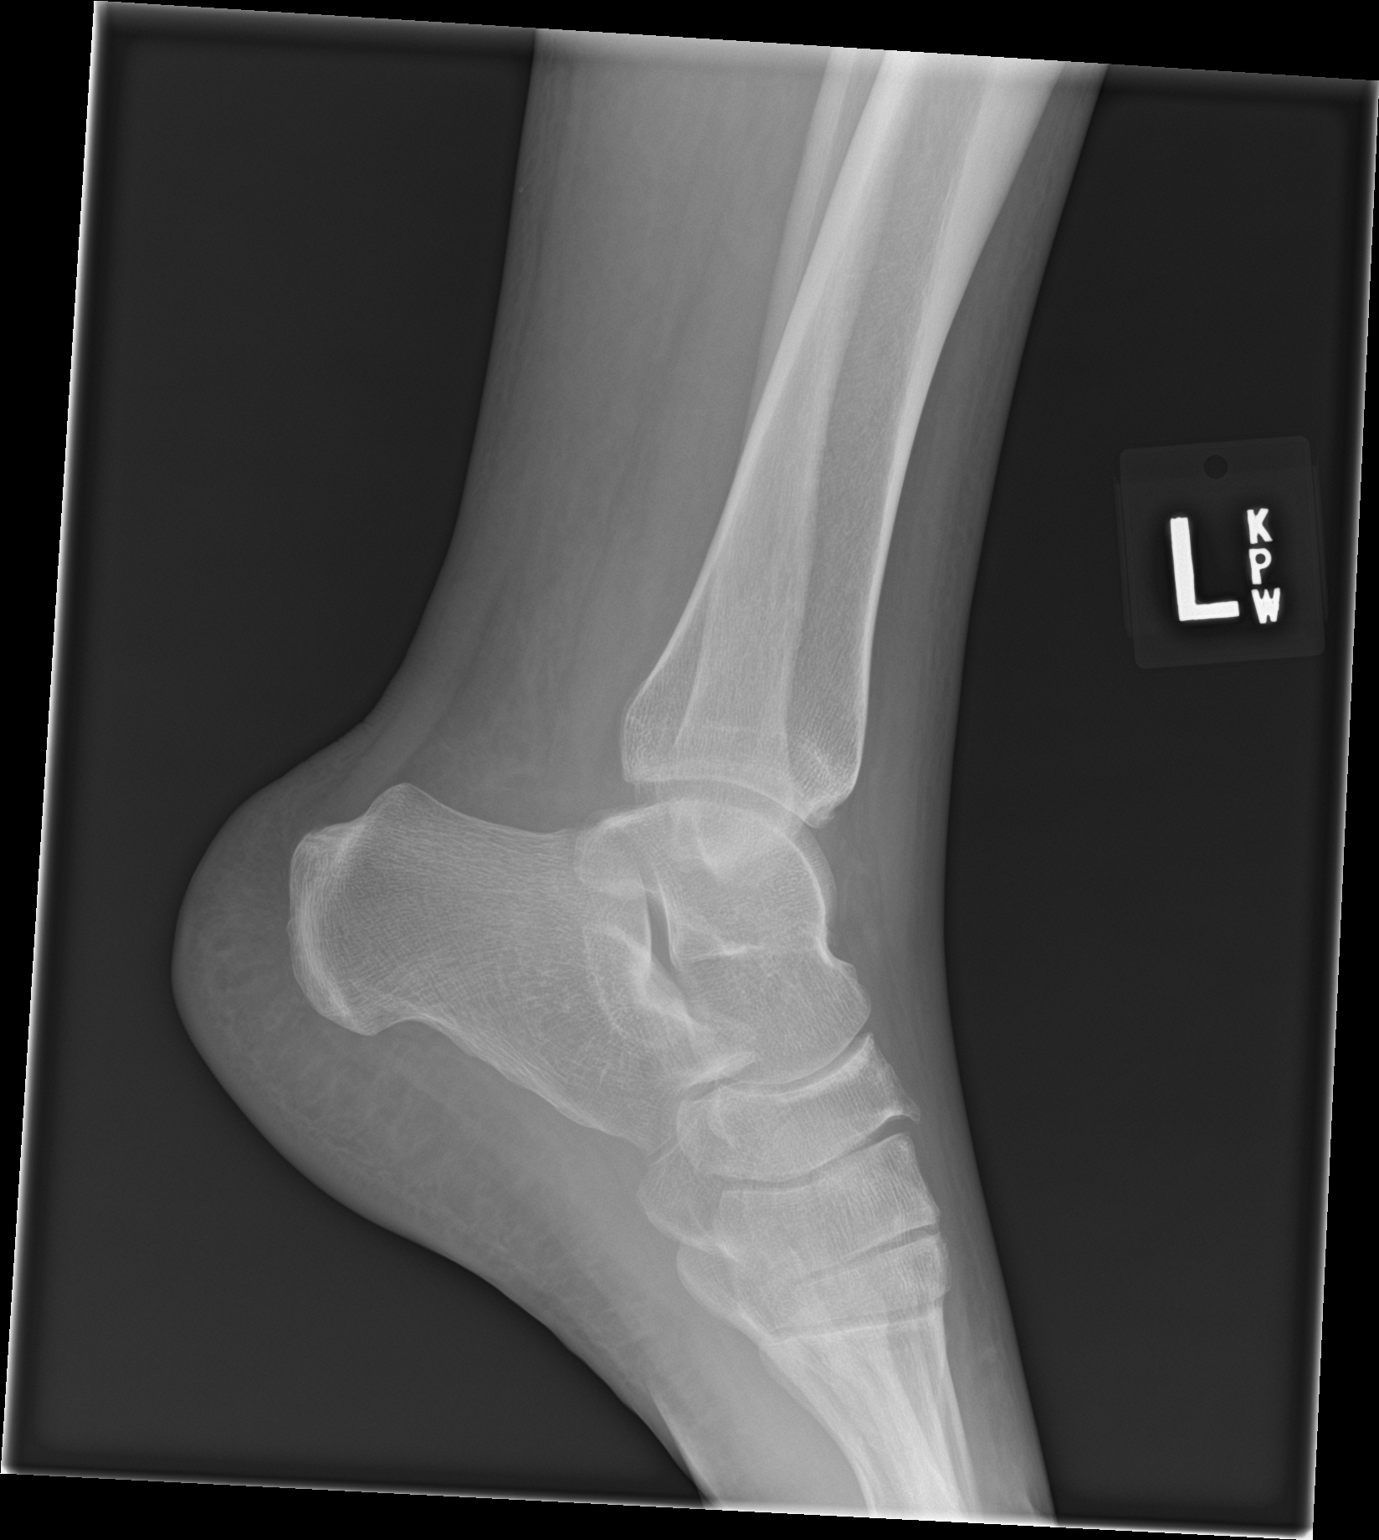

[3 of 3 positions shown; findings below may reference images not displayed]

FINDINGS: There is moderate soft tissue swelling about the medial aspect of
the ankle. No radiopaque foreign body, dissecting soft tissue
emphysema, acute fracture, dislocation, or osseous erosion is
identified. There is mild dorsal spurring of the navicular.
IMPRESSION: Soft tissue swelling without acute osseous abnormality.
